# Patient Record
Sex: Female | Born: 1945 | Race: White | Hispanic: No | Marital: Married | Smoking: Never smoker
Health system: Southern US, Community
[De-identification: ages and names within clinical notes are randomized; demographics above are authoritative.]

## PROBLEM LIST (undated history)

## (undated) DIAGNOSIS — D473 Essential (hemorrhagic) thrombocythemia: Secondary | ICD-10-CM

## (undated) DIAGNOSIS — M545 Low back pain, unspecified: Secondary | ICD-10-CM

## (undated) DIAGNOSIS — I5032 Chronic diastolic (congestive) heart failure: Secondary | ICD-10-CM

## (undated) DIAGNOSIS — E871 Hypo-osmolality and hyponatremia: Secondary | ICD-10-CM

## (undated) DIAGNOSIS — R51 Headache: Secondary | ICD-10-CM

## (undated) DIAGNOSIS — I10 Essential (primary) hypertension: Secondary | ICD-10-CM

## (undated) DIAGNOSIS — R112 Nausea with vomiting, unspecified: Secondary | ICD-10-CM

## (undated) DIAGNOSIS — K219 Gastro-esophageal reflux disease without esophagitis: Secondary | ICD-10-CM

## (undated) DIAGNOSIS — G8929 Other chronic pain: Secondary | ICD-10-CM

## (undated) DIAGNOSIS — F419 Anxiety disorder, unspecified: Secondary | ICD-10-CM

## (undated) DIAGNOSIS — E7800 Pure hypercholesterolemia, unspecified: Secondary | ICD-10-CM

## (undated) DIAGNOSIS — Z9889 Other specified postprocedural states: Secondary | ICD-10-CM

## (undated) DIAGNOSIS — G459 Transient cerebral ischemic attack, unspecified: Secondary | ICD-10-CM

## (undated) DIAGNOSIS — E039 Hypothyroidism, unspecified: Secondary | ICD-10-CM

## (undated) HISTORY — PX: BACK SURGERY: SHX140

## (undated) HISTORY — DX: Hypo-osmolality and hyponatremia: E87.1

## (undated) HISTORY — PX: PERIPHERALLY INSERTED CENTRAL CATHETER INSERTION: SHX2221

## (undated) HISTORY — PX: INCISION AND DRAINAGE OF WOUND: SHX1803

## (undated) HISTORY — DX: Essential (hemorrhagic) thrombocythemia: D47.3

## (undated) HISTORY — DX: Chronic diastolic (congestive) heart failure: I50.32

## (undated) HISTORY — PX: CHOLECYSTECTOMY: SHX55

## (undated) MED FILL — Ferumoxytol Inj 510 MG/17ML (30 MG/ML) (Elemental Fe): INTRAVENOUS | Qty: 17 | Status: AC

---

## 1989-01-31 HISTORY — PX: BLADDER SUSPENSION: SHX72

## 1997-11-28 ENCOUNTER — Ambulatory Visit (HOSPITAL_COMMUNITY): Admission: RE | Admit: 1997-11-28 | Discharge: 1997-11-28 | Payer: Self-pay | Admitting: *Deleted

## 1998-04-10 ENCOUNTER — Ambulatory Visit (HOSPITAL_COMMUNITY): Admission: RE | Admit: 1998-04-10 | Discharge: 1998-04-10 | Payer: Self-pay | Admitting: Internal Medicine

## 1998-04-10 ENCOUNTER — Encounter: Admission: RE | Admit: 1998-04-10 | Discharge: 1998-04-10 | Payer: Self-pay | Admitting: Internal Medicine

## 1998-04-11 ENCOUNTER — Ambulatory Visit (HOSPITAL_COMMUNITY): Admission: RE | Admit: 1998-04-11 | Discharge: 1998-04-11 | Payer: Self-pay | Admitting: Hematology and Oncology

## 1998-11-29 ENCOUNTER — Encounter: Payer: Self-pay | Admitting: *Deleted

## 1998-11-29 ENCOUNTER — Ambulatory Visit (HOSPITAL_COMMUNITY): Admission: RE | Admit: 1998-11-29 | Discharge: 1998-11-29 | Payer: Self-pay | Admitting: *Deleted

## 1999-12-06 ENCOUNTER — Encounter: Payer: Self-pay | Admitting: *Deleted

## 1999-12-06 ENCOUNTER — Ambulatory Visit (HOSPITAL_COMMUNITY): Admission: RE | Admit: 1999-12-06 | Discharge: 1999-12-06 | Payer: Self-pay | Admitting: *Deleted

## 2000-12-08 ENCOUNTER — Ambulatory Visit (HOSPITAL_COMMUNITY): Admission: RE | Admit: 2000-12-08 | Discharge: 2000-12-08 | Payer: Self-pay | Admitting: *Deleted

## 2000-12-08 ENCOUNTER — Encounter: Payer: Self-pay | Admitting: *Deleted

## 2009-08-03 ENCOUNTER — Observation Stay (HOSPITAL_COMMUNITY): Admission: EM | Admit: 2009-08-03 | Discharge: 2009-08-04 | Payer: Self-pay | Admitting: Emergency Medicine

## 2009-08-03 ENCOUNTER — Ambulatory Visit: Payer: Self-pay | Admitting: Cardiology

## 2009-08-03 ENCOUNTER — Ambulatory Visit: Payer: Self-pay | Admitting: Gastroenterology

## 2010-08-26 LAB — URINE MICROSCOPIC-ADD ON

## 2010-08-26 LAB — LIPID PANEL
Cholesterol: 221 mg/dL — ABNORMAL HIGH (ref 0–200)
HDL: 53 mg/dL (ref >39)
LDL Cholesterol: 144 mg/dL — ABNORMAL HIGH (ref 0–99)
Total CHOL/HDL Ratio: 4.2 RATIO
Triglycerides: 118 mg/dL (ref <150)
VLDL: 24 mg/dL (ref 0–40)

## 2010-08-26 LAB — COMPREHENSIVE METABOLIC PANEL
ALT: 22 U/L (ref 0–35)
AST: 54 U/L — ABNORMAL HIGH (ref 0–37)
Albumin: 3.9 g/dL (ref 3.5–5.2)
CO2: 28 mEq/L (ref 19–32)
Calcium: 9.5 mg/dL (ref 8.4–10.5)
Chloride: 99 mEq/L (ref 96–112)
GFR calc Af Amer: >60 mL/min (ref >60)
GFR calc non Af Amer: >60 mL/min (ref >60)
Sodium: 132 mEq/L — ABNORMAL LOW (ref 135–145)

## 2010-08-26 LAB — LIPASE, BLOOD: Lipase: 20 U/L (ref 11–59)

## 2010-08-26 LAB — URINALYSIS, ROUTINE W REFLEX MICROSCOPIC
Glucose, UA: NEGATIVE mg/dL
Hgb urine dipstick: NEGATIVE
Ketones, ur: 40 mg/dL — AB
Nitrite: NEGATIVE
Protein, ur: 30 mg/dL — AB
Specific Gravity, Urine: 1.028 (ref 1.005–1.030)
Urobilinogen, UA: 0.2 mg/dL (ref 0.0–1.0)
pH: 6 (ref 5.0–8.0)

## 2010-08-26 LAB — CBC
MCHC: 33.8 g/dL (ref 30.0–36.0)
Platelets: 352 10*3/uL (ref 150–400)
RBC: 4.31 MIL/uL (ref 3.87–5.11)
WBC: 11 10*3/uL — ABNORMAL HIGH (ref 4.0–10.5)

## 2010-08-26 LAB — POCT CARDIAC MARKERS
CKMB, poc: 1.1 ng/mL (ref 1.0–8.0)
CKMB, poc: 2.4 ng/mL (ref 1.0–8.0)
Myoglobin, poc: 119 ng/mL (ref 12–200)
Troponin i, poc: <0.05 ng/mL (ref 0.00–0.09)
Troponin i, poc: <0.05 ng/mL (ref 0.00–0.09)

## 2010-08-26 LAB — DIFFERENTIAL
Eosinophils Absolute: 0 10*3/uL (ref 0.0–0.7)
Eosinophils Relative: 0 % (ref 0–5)
Lymphs Abs: 0.5 10*3/uL — ABNORMAL LOW (ref 0.7–4.0)
Monocytes Absolute: 0.2 10*3/uL (ref 0.1–1.0)

## 2010-08-26 LAB — CARDIAC PANEL(CRET KIN+CKTOT+MB+TROPI)
CK, MB: 2.3 ng/mL (ref 0.3–4.0)
Relative Index: 1.8 (ref 0.0–2.5)
Total CK: 131 U/L (ref 7–177)

## 2010-08-26 LAB — CK TOTAL AND CKMB (NOT AT ARMC): Relative Index: 2 (ref 0.0–2.5)

## 2011-03-28 IMAGING — US US ABDOMEN COMPLETE
1 series · 14 of 25 positions shown · non-contrast
Comparison: None

CLINICAL DATA: Right upper quadrant pain, nausea, vomiting.

COMPLETE ABDOMINAL ULTRASOUND

[Series 1: us abdomen complete · 0.33mm/px · 14 of 73 slices shown]
[im 1/73]
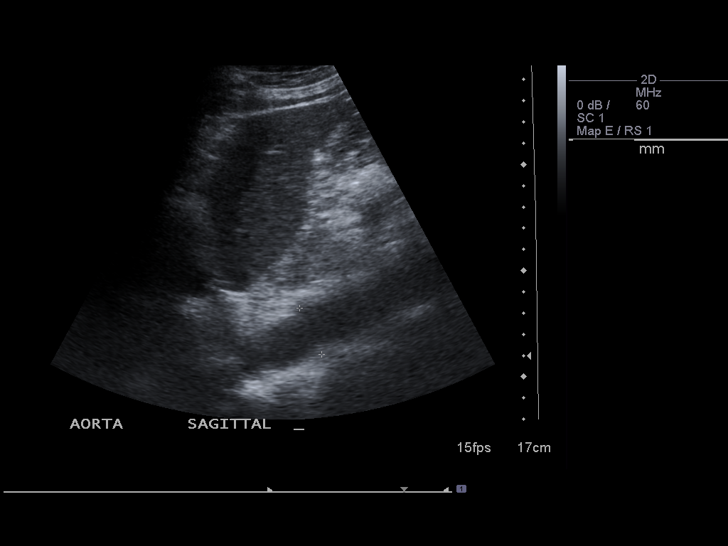
[im 7/73]
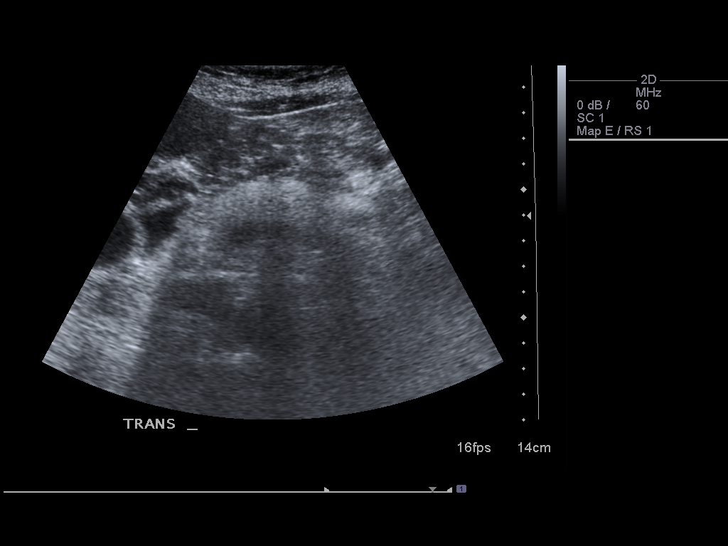
[im 13/73]
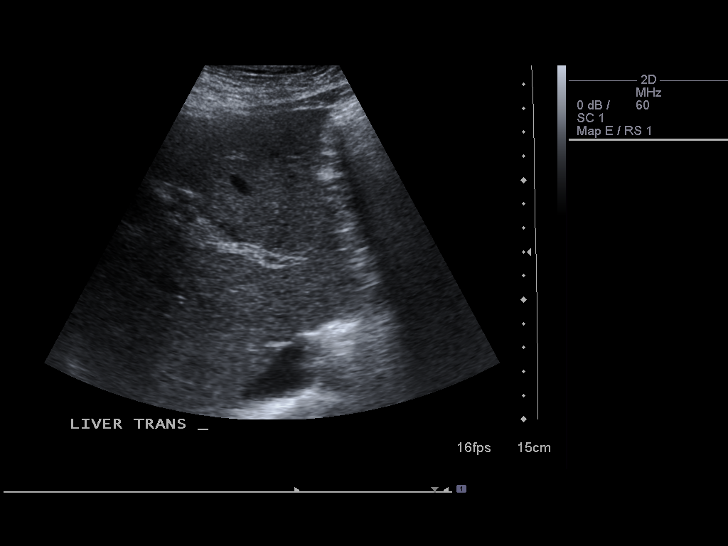
[im 19/73]
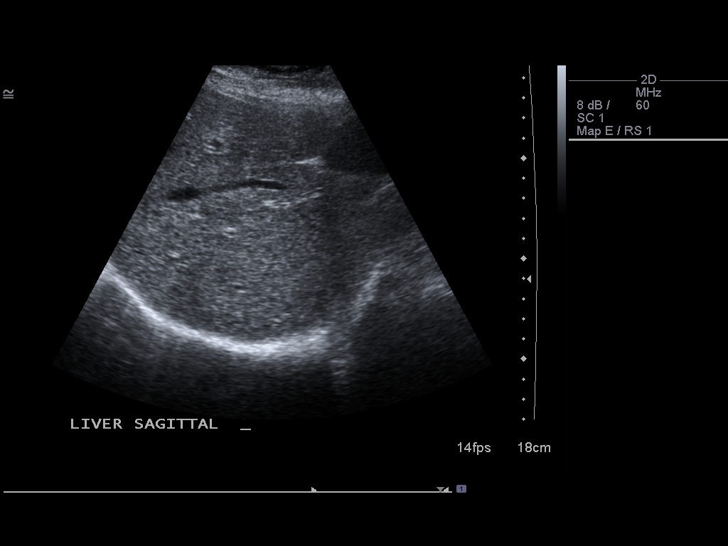
[im 25/73]
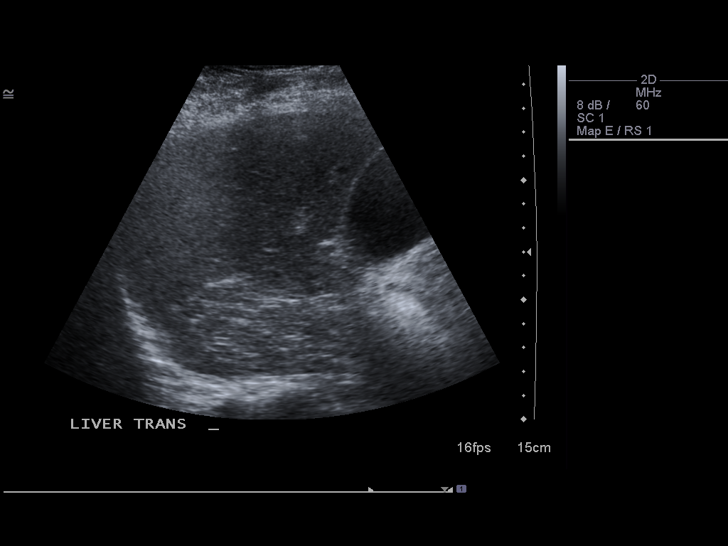
[im 28/73]
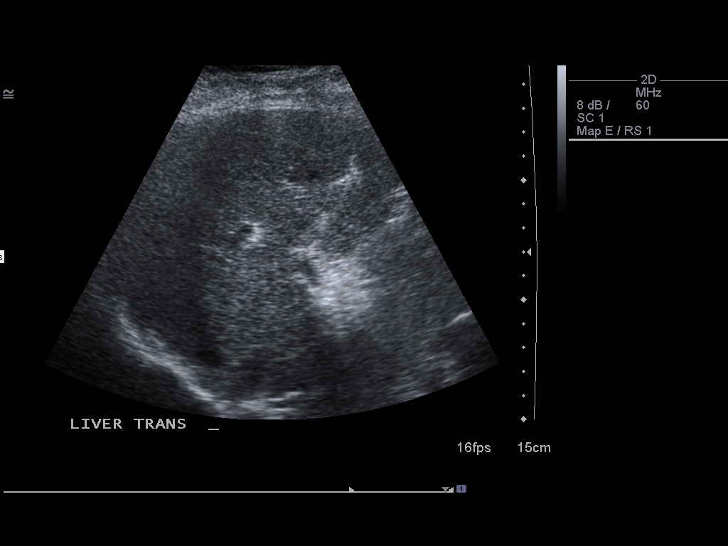
[im 34/73]
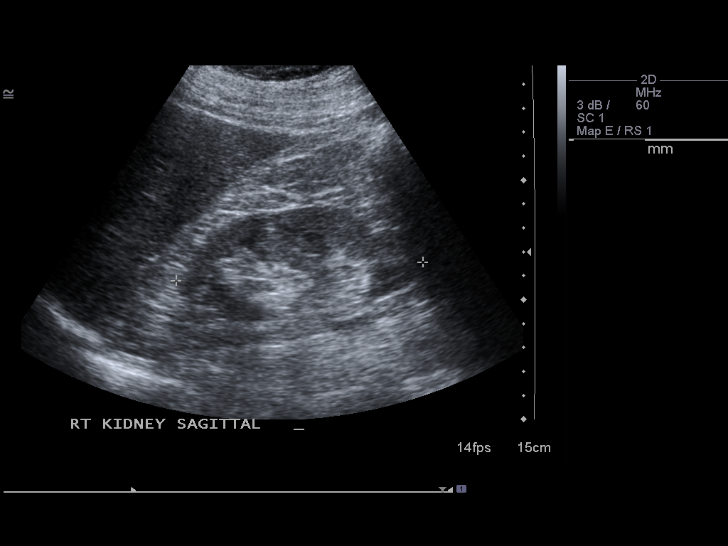
[im 40/73]
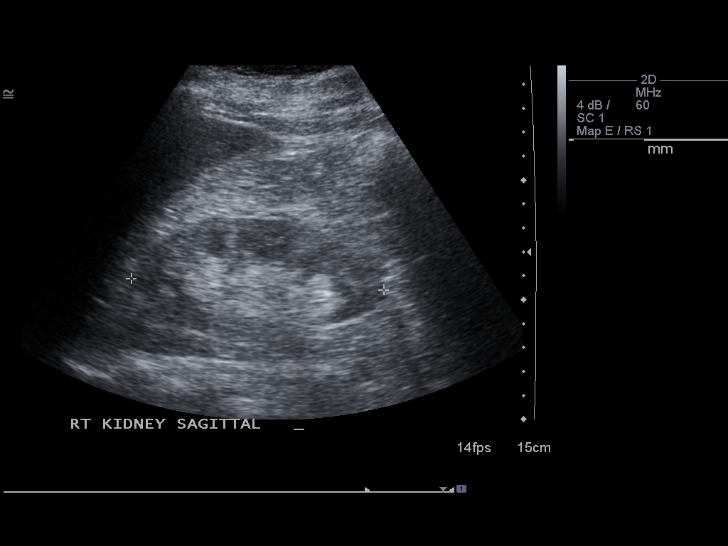
[im 46/73]
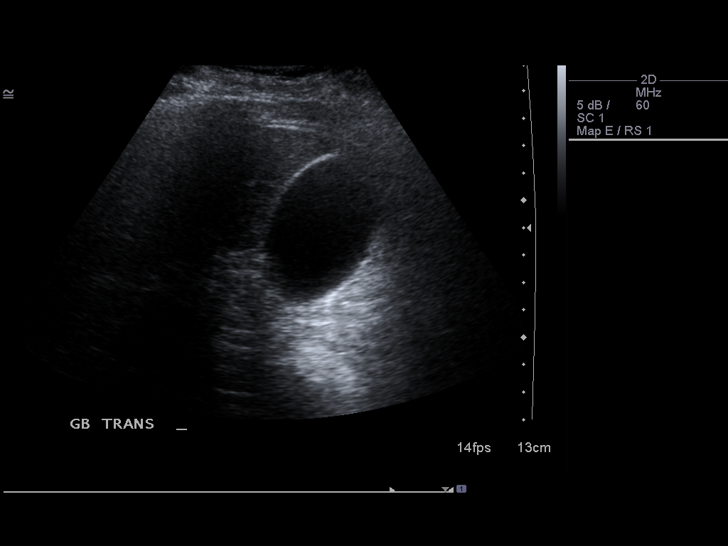
[im 49/73]
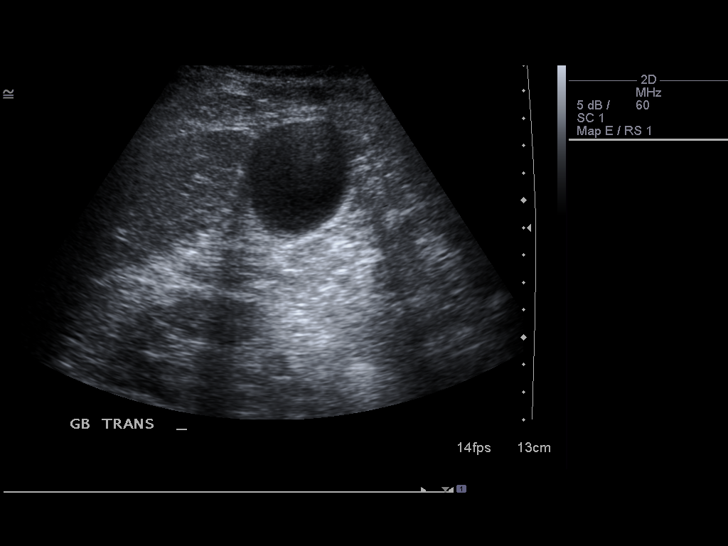
[im 55/73]
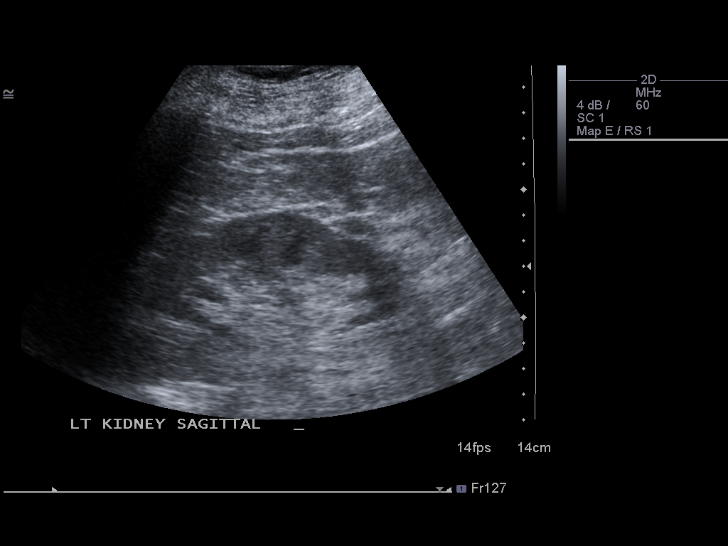
[im 61/73]
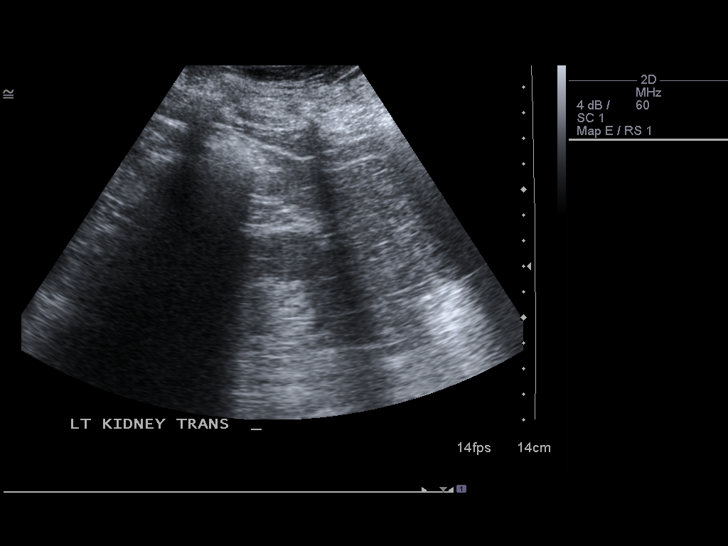
[im 67/73]
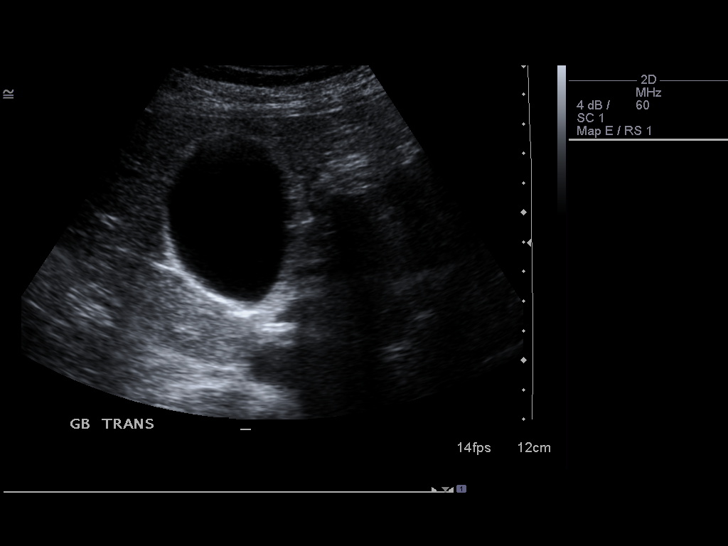
[im 73/73]
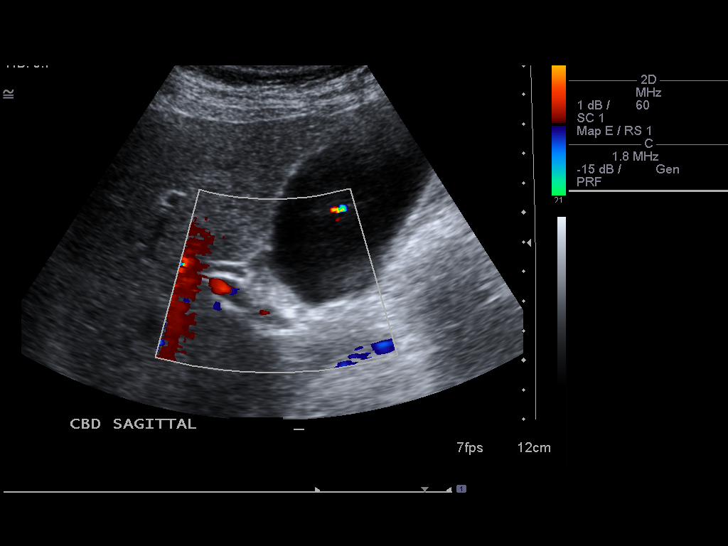

[14 of 25 positions shown; findings below may reference images not displayed]

FINDINGS: Gallbladder:  No gallstones, gallbladder wall thickening, or
pericholecystic fluid.

Common bile duct:   Within normal limits in caliber.

Liver:  1.6 cm benign-appearing cyst in the left hepatic lobe.
Otherwise no focal abnormalities.  Normal echotexture and size.

IVC:  Appears normal.

Pancreas:  No focal abnormality seen.

Spleen:  Within normal limits in size and echotexture.

Right Kidney:   Normal in size and parenchymal echogenicity.  No
evidence of mass or hydronephrosis.

Left Kidney:  Normal in size and parenchymal echogenicity.  No
evidence of mass or hydronephrosis.

Abdominal aorta:  No aneurysm identified.
IMPRESSION: No evidence of cholelithiasis or acute cholecystitis.

No acute findings in the abdomen.

## 2011-11-06 DIAGNOSIS — E039 Hypothyroidism, unspecified: Secondary | ICD-10-CM | POA: Diagnosis not present

## 2011-11-06 DIAGNOSIS — M48061 Spinal stenosis, lumbar region without neurogenic claudication: Secondary | ICD-10-CM | POA: Diagnosis not present

## 2011-11-06 DIAGNOSIS — IMO0002 Reserved for concepts with insufficient information to code with codable children: Secondary | ICD-10-CM | POA: Diagnosis not present

## 2011-11-06 DIAGNOSIS — E7800 Pure hypercholesterolemia, unspecified: Secondary | ICD-10-CM | POA: Diagnosis not present

## 2011-11-06 DIAGNOSIS — I10 Essential (primary) hypertension: Secondary | ICD-10-CM | POA: Diagnosis not present

## 2011-11-06 HISTORY — PX: LUMBAR DISC SURGERY: SHX700

## 2011-11-15 DIAGNOSIS — D62 Acute posthemorrhagic anemia: Secondary | ICD-10-CM | POA: Diagnosis not present

## 2011-11-15 DIAGNOSIS — G988 Other disorders of nervous system: Secondary | ICD-10-CM | POA: Diagnosis not present

## 2011-11-15 DIAGNOSIS — R9431 Abnormal electrocardiogram [ECG] [EKG]: Secondary | ICD-10-CM | POA: Diagnosis not present

## 2011-11-15 DIAGNOSIS — E039 Hypothyroidism, unspecified: Secondary | ICD-10-CM | POA: Diagnosis not present

## 2011-11-15 DIAGNOSIS — Z79899 Other long term (current) drug therapy: Secondary | ICD-10-CM | POA: Diagnosis not present

## 2011-11-15 DIAGNOSIS — A0472 Enterocolitis due to Clostridium difficile, not specified as recurrent: Secondary | ICD-10-CM | POA: Diagnosis not present

## 2011-11-15 DIAGNOSIS — K920 Hematemesis: Secondary | ICD-10-CM | POA: Diagnosis not present

## 2011-11-15 DIAGNOSIS — R5381 Other malaise: Secondary | ICD-10-CM | POA: Diagnosis not present

## 2011-11-15 DIAGNOSIS — K219 Gastro-esophageal reflux disease without esophagitis: Secondary | ICD-10-CM | POA: Diagnosis not present

## 2011-11-15 DIAGNOSIS — E876 Hypokalemia: Secondary | ICD-10-CM | POA: Diagnosis not present

## 2011-11-15 DIAGNOSIS — G4700 Insomnia, unspecified: Secondary | ICD-10-CM | POA: Diagnosis present

## 2011-11-15 DIAGNOSIS — T819XXA Unspecified complication of procedure, initial encounter: Secondary | ICD-10-CM | POA: Diagnosis not present

## 2011-11-15 DIAGNOSIS — J189 Pneumonia, unspecified organism: Secondary | ICD-10-CM | POA: Diagnosis not present

## 2011-11-15 DIAGNOSIS — R5383 Other fatigue: Secondary | ICD-10-CM | POA: Diagnosis not present

## 2011-11-15 DIAGNOSIS — E785 Hyperlipidemia, unspecified: Secondary | ICD-10-CM | POA: Diagnosis not present

## 2011-11-15 DIAGNOSIS — D500 Iron deficiency anemia secondary to blood loss (chronic): Secondary | ICD-10-CM | POA: Diagnosis not present

## 2011-11-15 DIAGNOSIS — D649 Anemia, unspecified: Secondary | ICD-10-CM | POA: Diagnosis not present

## 2011-11-15 DIAGNOSIS — I10 Essential (primary) hypertension: Secondary | ICD-10-CM | POA: Diagnosis not present

## 2011-11-15 DIAGNOSIS — G8918 Other acute postprocedural pain: Secondary | ICD-10-CM | POA: Diagnosis not present

## 2011-11-15 DIAGNOSIS — T8140XA Infection following a procedure, unspecified, initial encounter: Secondary | ICD-10-CM | POA: Diagnosis not present

## 2011-11-15 DIAGNOSIS — D638 Anemia in other chronic diseases classified elsewhere: Secondary | ICD-10-CM | POA: Diagnosis not present

## 2011-11-15 DIAGNOSIS — G9741 Accidental puncture or laceration of dura during a procedure: Secondary | ICD-10-CM | POA: Diagnosis not present

## 2011-11-15 DIAGNOSIS — R197 Diarrhea, unspecified: Secondary | ICD-10-CM | POA: Diagnosis not present

## 2011-11-15 DIAGNOSIS — I130 Hypertensive heart and chronic kidney disease with heart failure and stage 1 through stage 4 chronic kidney disease, or unspecified chronic kidney disease: Secondary | ICD-10-CM | POA: Diagnosis not present

## 2011-11-15 DIAGNOSIS — Y838 Other surgical procedures as the cause of abnormal reaction of the patient, or of later complication, without mention of misadventure at the time of the procedure: Secondary | ICD-10-CM | POA: Diagnosis not present

## 2011-11-15 DIAGNOSIS — I495 Sick sinus syndrome: Secondary | ICD-10-CM | POA: Diagnosis not present

## 2011-11-15 DIAGNOSIS — N039 Chronic nephritic syndrome with unspecified morphologic changes: Secondary | ICD-10-CM | POA: Diagnosis not present

## 2011-11-15 DIAGNOSIS — E871 Hypo-osmolality and hyponatremia: Secondary | ICD-10-CM | POA: Diagnosis not present

## 2011-11-15 DIAGNOSIS — F411 Generalized anxiety disorder: Secondary | ICD-10-CM | POA: Diagnosis present

## 2011-11-15 DIAGNOSIS — R110 Nausea: Secondary | ICD-10-CM | POA: Diagnosis not present

## 2011-11-15 DIAGNOSIS — A419 Sepsis, unspecified organism: Secondary | ICD-10-CM | POA: Diagnosis not present

## 2011-11-15 DIAGNOSIS — M545 Low back pain, unspecified: Secondary | ICD-10-CM | POA: Diagnosis not present

## 2011-11-15 DIAGNOSIS — J9819 Other pulmonary collapse: Secondary | ICD-10-CM | POA: Diagnosis not present

## 2011-11-15 DIAGNOSIS — I119 Hypertensive heart disease without heart failure: Secondary | ICD-10-CM | POA: Diagnosis not present

## 2011-11-15 DIAGNOSIS — D6489 Other specified anemias: Secondary | ICD-10-CM | POA: Diagnosis present

## 2011-12-01 DIAGNOSIS — I119 Hypertensive heart disease without heart failure: Secondary | ICD-10-CM | POA: Diagnosis not present

## 2011-12-01 DIAGNOSIS — K219 Gastro-esophageal reflux disease without esophagitis: Secondary | ICD-10-CM | POA: Diagnosis not present

## 2011-12-01 DIAGNOSIS — R32 Unspecified urinary incontinence: Secondary | ICD-10-CM | POA: Diagnosis not present

## 2011-12-01 DIAGNOSIS — Z48811 Encounter for surgical aftercare following surgery on the nervous system: Secondary | ICD-10-CM | POA: Diagnosis not present

## 2011-12-01 DIAGNOSIS — R269 Unspecified abnormalities of gait and mobility: Secondary | ICD-10-CM | POA: Diagnosis not present

## 2011-12-02 DIAGNOSIS — Z48811 Encounter for surgical aftercare following surgery on the nervous system: Secondary | ICD-10-CM | POA: Diagnosis not present

## 2011-12-02 DIAGNOSIS — K219 Gastro-esophageal reflux disease without esophagitis: Secondary | ICD-10-CM | POA: Diagnosis not present

## 2011-12-02 DIAGNOSIS — R32 Unspecified urinary incontinence: Secondary | ICD-10-CM | POA: Diagnosis not present

## 2011-12-02 DIAGNOSIS — I119 Hypertensive heart disease without heart failure: Secondary | ICD-10-CM | POA: Diagnosis not present

## 2011-12-02 DIAGNOSIS — R269 Unspecified abnormalities of gait and mobility: Secondary | ICD-10-CM | POA: Diagnosis not present

## 2011-12-05 DIAGNOSIS — R269 Unspecified abnormalities of gait and mobility: Secondary | ICD-10-CM | POA: Diagnosis not present

## 2011-12-05 DIAGNOSIS — K219 Gastro-esophageal reflux disease without esophagitis: Secondary | ICD-10-CM | POA: Diagnosis not present

## 2011-12-05 DIAGNOSIS — I119 Hypertensive heart disease without heart failure: Secondary | ICD-10-CM | POA: Diagnosis not present

## 2011-12-05 DIAGNOSIS — R32 Unspecified urinary incontinence: Secondary | ICD-10-CM | POA: Diagnosis not present

## 2011-12-05 DIAGNOSIS — Z48811 Encounter for surgical aftercare following surgery on the nervous system: Secondary | ICD-10-CM | POA: Diagnosis not present

## 2011-12-08 DIAGNOSIS — L02818 Cutaneous abscess of other sites: Secondary | ICD-10-CM | POA: Diagnosis not present

## 2011-12-08 DIAGNOSIS — R269 Unspecified abnormalities of gait and mobility: Secondary | ICD-10-CM | POA: Diagnosis not present

## 2011-12-08 DIAGNOSIS — R5383 Other fatigue: Secondary | ICD-10-CM | POA: Diagnosis not present

## 2011-12-08 DIAGNOSIS — I119 Hypertensive heart disease without heart failure: Secondary | ICD-10-CM | POA: Diagnosis not present

## 2011-12-08 DIAGNOSIS — R5381 Other malaise: Secondary | ICD-10-CM | POA: Diagnosis not present

## 2011-12-08 DIAGNOSIS — E871 Hypo-osmolality and hyponatremia: Secondary | ICD-10-CM | POA: Diagnosis not present

## 2011-12-08 DIAGNOSIS — Z48811 Encounter for surgical aftercare following surgery on the nervous system: Secondary | ICD-10-CM | POA: Diagnosis not present

## 2011-12-08 DIAGNOSIS — K219 Gastro-esophageal reflux disease without esophagitis: Secondary | ICD-10-CM | POA: Diagnosis not present

## 2011-12-08 DIAGNOSIS — A0472 Enterocolitis due to Clostridium difficile, not specified as recurrent: Secondary | ICD-10-CM | POA: Diagnosis not present

## 2011-12-08 DIAGNOSIS — R32 Unspecified urinary incontinence: Secondary | ICD-10-CM | POA: Diagnosis not present

## 2011-12-10 DIAGNOSIS — I119 Hypertensive heart disease without heart failure: Secondary | ICD-10-CM | POA: Diagnosis not present

## 2011-12-10 DIAGNOSIS — R32 Unspecified urinary incontinence: Secondary | ICD-10-CM | POA: Diagnosis not present

## 2011-12-10 DIAGNOSIS — Z48811 Encounter for surgical aftercare following surgery on the nervous system: Secondary | ICD-10-CM | POA: Diagnosis not present

## 2011-12-10 DIAGNOSIS — R269 Unspecified abnormalities of gait and mobility: Secondary | ICD-10-CM | POA: Diagnosis not present

## 2011-12-10 DIAGNOSIS — K219 Gastro-esophageal reflux disease without esophagitis: Secondary | ICD-10-CM | POA: Diagnosis not present

## 2011-12-12 DIAGNOSIS — I10 Essential (primary) hypertension: Secondary | ICD-10-CM | POA: Diagnosis not present

## 2011-12-12 DIAGNOSIS — L03319 Cellulitis of trunk, unspecified: Secondary | ICD-10-CM | POA: Diagnosis present

## 2011-12-12 DIAGNOSIS — M6281 Muscle weakness (generalized): Secondary | ICD-10-CM | POA: Diagnosis not present

## 2011-12-12 DIAGNOSIS — Z5189 Encounter for other specified aftercare: Secondary | ICD-10-CM | POA: Diagnosis not present

## 2011-12-12 DIAGNOSIS — E039 Hypothyroidism, unspecified: Secondary | ICD-10-CM | POA: Diagnosis not present

## 2011-12-12 DIAGNOSIS — R262 Difficulty in walking, not elsewhere classified: Secondary | ICD-10-CM | POA: Diagnosis not present

## 2011-12-12 DIAGNOSIS — I119 Hypertensive heart disease without heart failure: Secondary | ICD-10-CM | POA: Diagnosis not present

## 2011-12-12 DIAGNOSIS — L02219 Cutaneous abscess of trunk, unspecified: Secondary | ICD-10-CM | POA: Diagnosis present

## 2011-12-12 DIAGNOSIS — R279 Unspecified lack of coordination: Secondary | ICD-10-CM | POA: Diagnosis not present

## 2011-12-12 DIAGNOSIS — Z79899 Other long term (current) drug therapy: Secondary | ICD-10-CM | POA: Diagnosis not present

## 2011-12-12 DIAGNOSIS — Z4789 Encounter for other orthopedic aftercare: Secondary | ICD-10-CM | POA: Diagnosis not present

## 2011-12-12 DIAGNOSIS — Z9889 Other specified postprocedural states: Secondary | ICD-10-CM | POA: Diagnosis not present

## 2011-12-12 DIAGNOSIS — A4901 Methicillin susceptible Staphylococcus aureus infection, unspecified site: Secondary | ICD-10-CM | POA: Diagnosis not present

## 2011-12-12 DIAGNOSIS — A0472 Enterocolitis due to Clostridium difficile, not specified as recurrent: Secondary | ICD-10-CM | POA: Diagnosis not present

## 2011-12-12 DIAGNOSIS — S21209A Unspecified open wound of unspecified back wall of thorax without penetration into thoracic cavity, initial encounter: Secondary | ICD-10-CM | POA: Diagnosis not present

## 2011-12-12 DIAGNOSIS — K219 Gastro-esophageal reflux disease without esophagitis: Secondary | ICD-10-CM | POA: Diagnosis not present

## 2011-12-12 DIAGNOSIS — E785 Hyperlipidemia, unspecified: Secondary | ICD-10-CM | POA: Diagnosis not present

## 2011-12-12 DIAGNOSIS — T8140XA Infection following a procedure, unspecified, initial encounter: Secondary | ICD-10-CM | POA: Diagnosis not present

## 2011-12-12 DIAGNOSIS — M549 Dorsalgia, unspecified: Secondary | ICD-10-CM | POA: Diagnosis not present

## 2011-12-15 DIAGNOSIS — M549 Dorsalgia, unspecified: Secondary | ICD-10-CM | POA: Diagnosis not present

## 2011-12-15 DIAGNOSIS — E039 Hypothyroidism, unspecified: Secondary | ICD-10-CM | POA: Diagnosis not present

## 2011-12-15 DIAGNOSIS — M6281 Muscle weakness (generalized): Secondary | ICD-10-CM | POA: Diagnosis not present

## 2011-12-15 DIAGNOSIS — Z9889 Other specified postprocedural states: Secondary | ICD-10-CM | POA: Diagnosis not present

## 2011-12-15 DIAGNOSIS — Z4789 Encounter for other orthopedic aftercare: Secondary | ICD-10-CM | POA: Diagnosis not present

## 2011-12-15 DIAGNOSIS — Z7901 Long term (current) use of anticoagulants: Secondary | ICD-10-CM | POA: Diagnosis not present

## 2011-12-15 DIAGNOSIS — T8140XA Infection following a procedure, unspecified, initial encounter: Secondary | ICD-10-CM | POA: Diagnosis not present

## 2011-12-15 DIAGNOSIS — R262 Difficulty in walking, not elsewhere classified: Secondary | ICD-10-CM | POA: Diagnosis not present

## 2011-12-15 DIAGNOSIS — A0472 Enterocolitis due to Clostridium difficile, not specified as recurrent: Secondary | ICD-10-CM | POA: Diagnosis not present

## 2011-12-15 DIAGNOSIS — E785 Hyperlipidemia, unspecified: Secondary | ICD-10-CM | POA: Diagnosis not present

## 2011-12-15 DIAGNOSIS — S21209A Unspecified open wound of unspecified back wall of thorax without penetration into thoracic cavity, initial encounter: Secondary | ICD-10-CM | POA: Diagnosis not present

## 2011-12-15 DIAGNOSIS — Z5189 Encounter for other specified aftercare: Secondary | ICD-10-CM | POA: Diagnosis not present

## 2011-12-15 DIAGNOSIS — A4901 Methicillin susceptible Staphylococcus aureus infection, unspecified site: Secondary | ICD-10-CM | POA: Diagnosis not present

## 2011-12-15 DIAGNOSIS — L02219 Cutaneous abscess of trunk, unspecified: Secondary | ICD-10-CM | POA: Diagnosis not present

## 2011-12-15 DIAGNOSIS — R279 Unspecified lack of coordination: Secondary | ICD-10-CM | POA: Diagnosis not present

## 2011-12-15 DIAGNOSIS — I10 Essential (primary) hypertension: Secondary | ICD-10-CM | POA: Diagnosis not present

## 2011-12-15 DIAGNOSIS — Z79899 Other long term (current) drug therapy: Secondary | ICD-10-CM | POA: Diagnosis not present

## 2011-12-15 DIAGNOSIS — K219 Gastro-esophageal reflux disease without esophagitis: Secondary | ICD-10-CM | POA: Diagnosis not present

## 2011-12-15 DIAGNOSIS — L03319 Cellulitis of trunk, unspecified: Secondary | ICD-10-CM | POA: Diagnosis not present

## 2011-12-15 DIAGNOSIS — I119 Hypertensive heart disease without heart failure: Secondary | ICD-10-CM | POA: Diagnosis not present

## 2011-12-15 DIAGNOSIS — IMO0002 Reserved for concepts with insufficient information to code with codable children: Secondary | ICD-10-CM | POA: Diagnosis not present

## 2011-12-29 DIAGNOSIS — M549 Dorsalgia, unspecified: Secondary | ICD-10-CM | POA: Diagnosis not present

## 2011-12-29 DIAGNOSIS — E039 Hypothyroidism, unspecified: Secondary | ICD-10-CM | POA: Diagnosis not present

## 2011-12-29 DIAGNOSIS — T8140XA Infection following a procedure, unspecified, initial encounter: Secondary | ICD-10-CM | POA: Diagnosis not present

## 2011-12-29 DIAGNOSIS — K219 Gastro-esophageal reflux disease without esophagitis: Secondary | ICD-10-CM | POA: Diagnosis not present

## 2012-01-15 DIAGNOSIS — E039 Hypothyroidism, unspecified: Secondary | ICD-10-CM | POA: Diagnosis not present

## 2012-01-15 DIAGNOSIS — G459 Transient cerebral ischemic attack, unspecified: Secondary | ICD-10-CM | POA: Diagnosis not present

## 2012-01-15 DIAGNOSIS — I10 Essential (primary) hypertension: Secondary | ICD-10-CM | POA: Diagnosis not present

## 2012-01-15 DIAGNOSIS — R4701 Aphasia: Secondary | ICD-10-CM | POA: Diagnosis not present

## 2012-01-15 DIAGNOSIS — M5137 Other intervertebral disc degeneration, lumbosacral region: Secondary | ICD-10-CM | POA: Diagnosis not present

## 2012-01-15 DIAGNOSIS — R269 Unspecified abnormalities of gait and mobility: Secondary | ICD-10-CM | POA: Diagnosis not present

## 2012-01-15 DIAGNOSIS — Z79899 Other long term (current) drug therapy: Secondary | ICD-10-CM | POA: Diagnosis not present

## 2012-01-15 DIAGNOSIS — Z9889 Other specified postprocedural states: Secondary | ICD-10-CM | POA: Diagnosis not present

## 2012-01-15 DIAGNOSIS — I619 Nontraumatic intracerebral hemorrhage, unspecified: Secondary | ICD-10-CM | POA: Diagnosis not present

## 2012-01-15 DIAGNOSIS — E876 Hypokalemia: Secondary | ICD-10-CM | POA: Diagnosis not present

## 2012-01-15 DIAGNOSIS — F411 Generalized anxiety disorder: Secondary | ICD-10-CM | POA: Diagnosis not present

## 2012-01-15 DIAGNOSIS — R4182 Altered mental status, unspecified: Secondary | ICD-10-CM | POA: Diagnosis not present

## 2012-01-15 DIAGNOSIS — N390 Urinary tract infection, site not specified: Secondary | ICD-10-CM | POA: Diagnosis not present

## 2012-01-15 DIAGNOSIS — F329 Major depressive disorder, single episode, unspecified: Secondary | ICD-10-CM | POA: Diagnosis not present

## 2012-01-15 DIAGNOSIS — E785 Hyperlipidemia, unspecified: Secondary | ICD-10-CM | POA: Diagnosis not present

## 2012-01-15 DIAGNOSIS — K219 Gastro-esophageal reflux disease without esophagitis: Secondary | ICD-10-CM | POA: Diagnosis not present

## 2012-01-15 DIAGNOSIS — A499 Bacterial infection, unspecified: Secondary | ICD-10-CM | POA: Diagnosis not present

## 2012-01-15 DIAGNOSIS — I119 Hypertensive heart disease without heart failure: Secondary | ICD-10-CM | POA: Diagnosis not present

## 2012-01-16 ENCOUNTER — Encounter (HOSPITAL_COMMUNITY): Payer: Self-pay | Admitting: General Practice

## 2012-01-16 ENCOUNTER — Inpatient Hospital Stay (HOSPITAL_COMMUNITY): Payer: Medicare Other

## 2012-01-16 ENCOUNTER — Inpatient Hospital Stay (HOSPITAL_COMMUNITY)
Admission: AD | Admit: 2012-01-16 | Discharge: 2012-01-18 | DRG: 093 | Disposition: A | Discharge status: Home / Self Care | Payer: Medicare Other | Source: Other Acute Inpatient Hospital | Attending: Internal Medicine | Admitting: Internal Medicine

## 2012-01-16 DIAGNOSIS — F411 Generalized anxiety disorder: Secondary | ICD-10-CM | POA: Diagnosis present

## 2012-01-16 DIAGNOSIS — G459 Transient cerebral ischemic attack, unspecified: Secondary | ICD-10-CM

## 2012-01-16 DIAGNOSIS — Z9889 Other specified postprocedural states: Secondary | ICD-10-CM | POA: Diagnosis not present

## 2012-01-16 DIAGNOSIS — M47817 Spondylosis without myelopathy or radiculopathy, lumbosacral region: Secondary | ICD-10-CM | POA: Diagnosis present

## 2012-01-16 DIAGNOSIS — Z79899 Other long term (current) drug therapy: Secondary | ICD-10-CM | POA: Diagnosis not present

## 2012-01-16 DIAGNOSIS — I639 Cerebral infarction, unspecified: Secondary | ICD-10-CM

## 2012-01-16 DIAGNOSIS — I619 Nontraumatic intracerebral hemorrhage, unspecified: Secondary | ICD-10-CM | POA: Diagnosis not present

## 2012-01-16 DIAGNOSIS — M1990 Unspecified osteoarthritis, unspecified site: Secondary | ICD-10-CM | POA: Diagnosis present

## 2012-01-16 DIAGNOSIS — I509 Heart failure, unspecified: Secondary | ICD-10-CM | POA: Diagnosis not present

## 2012-01-16 DIAGNOSIS — G9340 Encephalopathy, unspecified: Secondary | ICD-10-CM | POA: Diagnosis not present

## 2012-01-16 DIAGNOSIS — E039 Hypothyroidism, unspecified: Secondary | ICD-10-CM | POA: Diagnosis present

## 2012-01-16 DIAGNOSIS — G929 Unspecified toxic encephalopathy: Principal | ICD-10-CM | POA: Diagnosis present

## 2012-01-16 DIAGNOSIS — I119 Hypertensive heart disease without heart failure: Secondary | ICD-10-CM | POA: Diagnosis not present

## 2012-01-16 DIAGNOSIS — E785 Hyperlipidemia, unspecified: Secondary | ICD-10-CM | POA: Diagnosis present

## 2012-01-16 DIAGNOSIS — I6350 Cerebral infarction due to unspecified occlusion or stenosis of unspecified cerebral artery: Secondary | ICD-10-CM

## 2012-01-16 DIAGNOSIS — K219 Gastro-esophageal reflux disease without esophagitis: Secondary | ICD-10-CM | POA: Diagnosis present

## 2012-01-16 DIAGNOSIS — R51 Headache: Secondary | ICD-10-CM | POA: Diagnosis not present

## 2012-01-16 DIAGNOSIS — M5137 Other intervertebral disc degeneration, lumbosacral region: Secondary | ICD-10-CM | POA: Diagnosis not present

## 2012-01-16 DIAGNOSIS — N039 Chronic nephritic syndrome with unspecified morphologic changes: Secondary | ICD-10-CM | POA: Diagnosis not present

## 2012-01-16 DIAGNOSIS — R269 Unspecified abnormalities of gait and mobility: Secondary | ICD-10-CM | POA: Diagnosis not present

## 2012-01-16 DIAGNOSIS — R4789 Other speech disturbances: Secondary | ICD-10-CM | POA: Diagnosis not present

## 2012-01-16 DIAGNOSIS — G92 Toxic encephalopathy: Principal | ICD-10-CM | POA: Diagnosis present

## 2012-01-16 DIAGNOSIS — I10 Essential (primary) hypertension: Secondary | ICD-10-CM | POA: Diagnosis present

## 2012-01-16 DIAGNOSIS — I6789 Other cerebrovascular disease: Secondary | ICD-10-CM | POA: Diagnosis not present

## 2012-01-16 DIAGNOSIS — D473 Essential (hemorrhagic) thrombocythemia: Secondary | ICD-10-CM | POA: Insufficient documentation

## 2012-01-16 DIAGNOSIS — T40605A Adverse effect of unspecified narcotics, initial encounter: Secondary | ICD-10-CM | POA: Diagnosis present

## 2012-01-16 HISTORY — DX: Pure hypercholesterolemia, unspecified: E78.00

## 2012-01-16 HISTORY — DX: Essential (primary) hypertension: I10

## 2012-01-16 HISTORY — DX: Gastro-esophageal reflux disease without esophagitis: K21.9

## 2012-01-16 HISTORY — DX: Low back pain, unspecified: M54.50

## 2012-01-16 HISTORY — DX: Anxiety disorder, unspecified: F41.9

## 2012-01-16 HISTORY — DX: Other specified postprocedural states: Z98.890

## 2012-01-16 HISTORY — DX: Low back pain: M54.5

## 2012-01-16 HISTORY — DX: Headache: R51

## 2012-01-16 HISTORY — DX: Transient cerebral ischemic attack, unspecified: G45.9

## 2012-01-16 HISTORY — DX: Other chronic pain: G89.29

## 2012-01-16 HISTORY — DX: Hypothyroidism, unspecified: E03.9

## 2012-01-16 HISTORY — DX: Other specified postprocedural states: R11.2

## 2012-01-16 LAB — CBC
Hemoglobin: 10.6 g/dL — ABNORMAL LOW (ref 12.0–15.0)
MCH: 26.7 pg (ref 26.0–34.0)
MCHC: 31.4 g/dL (ref 30.0–36.0)
Platelets: 354 10*3/uL (ref 150–400)

## 2012-01-16 LAB — CREATININE, SERUM: Creatinine, Ser: 0.85 mg/dL (ref 0.50–1.10)

## 2012-01-16 MED ORDER — ACETAMINOPHEN 325 MG PO TABS: 650.0000 mg | ORAL_TABLET | ORAL | Status: DC | PRN

## 2012-01-16 MED ORDER — RISAQUAD PO CAPS
1.0000 | ORAL_CAPSULE | Freq: Every day | ORAL | Status: DC
  Administered 2012-01-16 – 2012-01-18 (×3): 1 via ORAL
  Filled 2012-01-16 (×3): qty 1

## 2012-01-16 MED ORDER — SCOPOLAMINE 1 MG/3DAYS TD PT72
1.0000 | MEDICATED_PATCH | TRANSDERMAL | Status: DC
  Filled 2012-01-16: qty 1

## 2012-01-16 MED ORDER — ONDANSETRON HCL 4 MG/2ML IJ SOLN
INTRAMUSCULAR | Status: AC
  Administered 2012-01-16: 4 mg
  Filled 2012-01-16: qty 2

## 2012-01-16 MED ORDER — PANTOPRAZOLE SODIUM 40 MG PO TBEC
40.0000 mg | DELAYED_RELEASE_TABLET | Freq: Every day | ORAL | Status: DC
  Administered 2012-01-17 – 2012-01-18 (×2): 40 mg via ORAL
  Filled 2012-01-16 (×2): qty 1

## 2012-01-16 MED ORDER — ADULT MULTIVITAMIN W/MINERALS CH
1.0000 | ORAL_TABLET | Freq: Every day | ORAL | Status: DC
  Administered 2012-01-16 – 2012-01-18 (×3): 1 via ORAL
  Filled 2012-01-16 (×3): qty 1

## 2012-01-16 MED ORDER — DEXTROSE 5 % IV SOLN
1.0000 g | INTRAVENOUS | Status: DC
  Administered 2012-01-17: 1 g via INTRAVENOUS
  Filled 2012-01-16 (×2): qty 10

## 2012-01-16 MED ORDER — ACETAMINOPHEN 10 MG/ML IV SOLN
1000.0000 mg | Freq: Four times a day (QID) | INTRAVENOUS | Status: DC
  Administered 2012-01-17 (×3): 1000 mg via INTRAVENOUS
  Filled 2012-01-16 (×4): qty 100

## 2012-01-16 MED ORDER — ASPIRIN 325 MG PO TABS
325.0000 mg | ORAL_TABLET | Freq: Every day | ORAL | Status: DC
  Administered 2012-01-16 – 2012-01-18 (×3): 325 mg via ORAL
  Filled 2012-01-16 (×3): qty 1

## 2012-01-16 MED ORDER — IRBESARTAN 75 MG PO TABS
75.0000 mg | ORAL_TABLET | Freq: Every day | ORAL | Status: DC
  Administered 2012-01-16 – 2012-01-18 (×3): 75 mg via ORAL
  Filled 2012-01-16 (×3): qty 1

## 2012-01-16 MED ORDER — DEXTROSE 5 % IV SOLN
1.0000 g | Freq: Every day | INTRAVENOUS | Status: DC
  Administered 2012-01-16: 1 g via INTRAVENOUS
  Filled 2012-01-16: qty 10

## 2012-01-16 MED ORDER — OXYCODONE HCL 5 MG PO TABS: 5.0000 mg | ORAL_TABLET | Freq: Four times a day (QID) | ORAL | Status: DC | PRN

## 2012-01-16 MED ORDER — HYDRALAZINE HCL 20 MG/ML IJ SOLN
10.0000 mg | Freq: Four times a day (QID) | INTRAMUSCULAR | Status: DC | PRN
  Filled 2012-01-16: qty 0.5

## 2012-01-16 MED ORDER — ONDANSETRON HCL 4 MG/2ML IJ SOLN
4.0000 mg | Freq: Four times a day (QID) | INTRAMUSCULAR | Status: DC | PRN
  Administered 2012-01-18: 4 mg via INTRAVENOUS
  Filled 2012-01-16: qty 2

## 2012-01-16 MED ORDER — ACETAMINOPHEN 650 MG RE SUPP: 650.0000 mg | RECTAL | Status: DC | PRN

## 2012-01-16 MED ORDER — LEVOTHYROXINE SODIUM 88 MCG PO TABS
88.0000 ug | ORAL_TABLET | Freq: Every day | ORAL | Status: DC
  Administered 2012-01-17 – 2012-01-18 (×2): 88 ug via ORAL
  Filled 2012-01-16 (×3): qty 1

## 2012-01-16 MED ORDER — HYDROMORPHONE HCL PF 1 MG/ML IJ SOLN
0.5000 mg | INTRAMUSCULAR | Status: DC | PRN
  Filled 2012-01-16: qty 1

## 2012-01-16 MED ORDER — SENNOSIDES-DOCUSATE SODIUM 8.6-50 MG PO TABS: 1.0000 | ORAL_TABLET | Freq: Every evening | ORAL | Status: DC | PRN

## 2012-01-16 MED ORDER — HEPARIN SODIUM (PORCINE) 5000 UNIT/ML IJ SOLN
5000.0000 [IU] | Freq: Three times a day (TID) | INTRAMUSCULAR | Status: DC
  Administered 2012-01-16 – 2012-01-18 (×5): 5000 [IU] via SUBCUTANEOUS
  Filled 2012-01-16 (×8): qty 1

## 2012-01-16 MED ORDER — SIMVASTATIN 20 MG PO TABS
20.0000 mg | ORAL_TABLET | Freq: Every day | ORAL | Status: DC
  Administered 2012-01-16 – 2012-01-17 (×2): 20 mg via ORAL
  Filled 2012-01-16 (×4): qty 1

## 2012-01-16 MED ORDER — DEXTROSE 5 % IV SOLN
1.0000 g | INTRAVENOUS | Status: DC
  Filled 2012-01-16: qty 10

## 2012-01-16 MED ORDER — TRAMADOL HCL 50 MG PO TABS: 25.0000 mg | ORAL_TABLET | Freq: Four times a day (QID) | ORAL | Status: DC | PRN

## 2012-01-16 MED ORDER — SODIUM CHLORIDE 0.9 % IV SOLN
INTRAVENOUS | Status: DC
  Administered 2012-01-17: 1000 mL via INTRAVENOUS

## 2012-01-16 MED ORDER — DEXTROSE 5 % IV SOLN
1.0000 g | Freq: Once | INTRAVENOUS | Status: AC
  Administered 2012-01-16: 1 g via INTRAVENOUS

## 2012-01-16 NOTE — H&P (Signed)
PCP:   No primary provider on file.   Chief Complaint:  Patient transferred from [facility] today.   HPI: This is a 66 year old female, with known history of HTN, dyslipidemia, hypothyroidism, GERD, OA, anxiety,s/p gall bladder surgery, bladder and rectum suspension. Patient also has lumbo-sacral DJD and chronic back pain, and as a matter of fact, is s/p lumbar laminectomy, by Dr Loralie Champagne at [facility], in early June 2013, complicated by wound infection, requiring I&D on 12/12/2011. She was eventually discharged to [facility] for rehab, at the end of July, 2013, and has completed 5 weeks of the total planned 6-week course of iv Rocephin. On 01/15/12,  At about 7:00 PM, while daughter was helping her get ready for bed, she had a sudden onset of expressive dysphasia, characterized by word-finding difficulties, and difficulty naming objects, but no visual obscuration, limb weakness or gait abnormality. She was evaluated in the ED at [facility], by Dr Dr Allena Earing, at reportedly, was asymptomatic at the time of this initial evaluation. She was admitted with a presumptive diagnosis of TIA, and underwent appropriate work up. Head CT of 01/15/12, showed no acute findings. Brain MRI of 01/16/12, also showed no acute infarct, although there was a small area of blood breakdown products in the left cerebellum. Vascular dopplers showed no hemodynamically significant ICA stenosis bilaterally, and vertebral artery flow was antegrade.12-lead EKG showed SR. These data were contained in patient records, delivered from [facility], and personally reviewed by me.   Allergies:  No Known Allergies    Past Medical History  Diagnosis Date  . Hypertension   . PONV (postoperative nausea and vomiting)     "violently; only thing that works for her is scopolamine patch "  . Hypothyroidism   . Anxiety   . GERD (gastroesophageal reflux disease)   . Chronic lower back pain   . High cholesterol     . Headache 01/16/2012    "all day; think its related to TIA"  . TIA (transient ischemic attack) 01/16/2012    Past Surgical History  Procedure Date  . Back surgery   . Lumbar disc surgery 11/06/2011    "shaved down 2 bulging discs"  . Incision and drainage of wound 11/15/2011; early 12/2011    "abscess; S/P back OR"; "staph infection in wound"  . Peripherally inserted central catheter insertion ~ 12/07/2011    right upper arm  . Bladder suspension 1990's    "w/rectal suspensiion"  . Cholecystectomy ~ 2010    Prior to Admission medications   Not on File    Social History:  does not have a smoking history on file. She has never used smokeless tobacco. She reports that she does not drink alcohol or use illicit drugs. Patient is married, and had offspring.  Family History: Patient's father had a CVA.   Review of Systems:  As per HPI and chief complaint. Patent denies fatigue, diminished appetite, weight loss, fever, chills, headache, blurred vision, has word-finding difficulties and a left supra-orbital headache, but no dysphagia, chest pain, cough, shortness of breath, orthopnea, paroxysmal nocturnal dyspnea, nausea, diaphoresis, abdominal pain, vomiting, diarrhea, belching, heartburn, hematemesis, melena, dysuria, nocturia, urinary frequency, hematochezia, lower extremity swelling, pain, or redness. The rest of the systems review is negative.  Physical Exam:  General:  Patient does not appear to be in obvious acute distress. Somnolent, but easily roused, and became alert immediately, communicative, talking in complete sentences, not short of breath at rest.  HEENT:  Mild clinical  pallor, no jaundice, no conjunctival injection or discharge. Hydration status is fair.  NECK:  Supple, JVP not seen, no carotid bruits, no palpable lymphadenopathy, no palpable goiter. CHEST:  Clinically clear to auscultation, no wheezes, no crackles. HEART:  Sounds 1 and 2 heard, normal, regular, no  murmurs. ABDOMEN:  Full, soft, non-tender, no palpable organomegaly, no palpable masses, normal bowel sounds. Surgical wound on lower back is well healed.  GENITALIA:  Not examined. LOWER EXTREMITIES:  No pitting edema, palpable peripheral pulses. MUSCULOSKELETAL SYSTEM:  Generalized osteoarthritic changes, otherwise, normal. CENTRAL NERVOUS SYSTEM:  Patient has word-finding difficulties, and difficulty naming objects. Occasionally, her sentences appear nonsensical. There is right facial asymmetry, and planter response is up-going on right. Otherwise, no focal neurologic deficit on gross examination. Patient does not really appear to have receptive dysphasia, although she sometimes carried out instructions inaccurately.   Labs on Admission:  No results found for this or any previous visit (from the past 48 hour(s)).  Radiological Exams on Admission: No results found.  Assessment/Plan Active Problems:  1. CVA (cerebral infarction): CVA/TIA: Patient presented with sudden onset expressive dysphasia,which has fluctuated over the last 24 hours. Imaging studies so far, have been negative, but patient is still symptomatic, and physical exam revealed mild right facial asymmetry. Will manage with antiplatelet medication, and do 2D Echocardiogram, to complete workup already initiated at [facility], but clearly, she will benefit from a neurology consultation. Meanwhile, will consult SLP.   2. HTN (hypertension): Patient has a known history of HTN, and BP is sub-optimally controlled at this time. Will reinstate pre-admission antihypertensives.   3. Dyslipidemia: Will continue statin treatment, and check lipid profile.   4. Hypothyroidism: continue thyroxine replacement therapy.   5. GERD (gastroesophageal reflux disease): Appears asymptomatic on PPI, which we shall continue.   6. History of back surgery: Patient is status post lumbar laminectomy, by Dr Loralie Champagne at [facility], in early June 2013,  complicated by wound infection, requiring I&D on 12/12/2011. She has completed 5 weeks of a total planned 6 weeks of antibiotic therapy. This will be continued. Patient has a RUE Mid-line.  Further management will depend on clinical course. Pat  Comment: Patient is Full Code.     Time Spent on Admission: 1 hour.   Jaydy Fitzhenry,CHRISTOPHER 01/16/2012, 5:22 PM

## 2012-01-16 NOTE — Progress Notes (Signed)
Patient c/o of vomiting. Entered room to assess. Pt vomiting and nauseated. Pt also c/o 8-9/10 pain in head. Also patient only oriented to self and speech is garbled, which was changed from my first assessment of the patient. Dr. Brien Few notified. Verbal order for 4mg  IV Zofran given and Dr. Brien Few ordered a STAT CT scan. IV Zofran given at 1743. Will continue to monitor.

## 2012-01-16 NOTE — Consult Note (Signed)
Reason for Consult:  Change in mental status, aphasia, Referring Physician: Dr. Merrie Roof Time of consult: 6:03 pm  01/16/2012  A STAT consult was placed by Dr. Hessie Knows  Charts, medications, labs and images reviewed CC:  Aphasia, change in mental status,   HPI:  This is an 66 y/o RHWF who underwent multiple back surgeries X 3  ( lumbar diskectomy) in June 2013  And then it got infected and had to go through again another lumbar procedure for I and D and another surgery for the same area. She was started on Hydrocodone for pain in addition to other sleeping aides. According to the daughter the patient has become progressively hypersomnolence and confused intermittently.  Patient is hypersomnolence but easily aroused, follows commands,  C/o of headaches, no nausea, no vomiting, no dizziness, no tinnitus, no  Blurry vision no diplopia, no photophobia, no phonophobia, no neck pain, no paresthesias, no Kaleidoscopic vision, no limb weakness, no  Hallucinations, no delusions. No chest pain, no sob, no abdominal pain, no loss of consciousness, no bladder or bowel incontinent, she is afebrile and on antibiotics since July 2013.  She was taken to the hospital yesterday from the rehab for confusion and hypersomnolence and also unable to participate in a conversation with her daughter and husband.  She had an MRI of the brain and Head CT done at the OSH Which were negative for ischemic and or hemorrhagic stroke.  I could not personally review the MRI because there were no disc present in the chart.  Patient seems to understand and follow commands and participates in an ongoing conversation. She intermittently falls asleep. She has akathisia   Past Medical History  Diagnosis Date  . Hypertension   . PONV (postoperative nausea and vomiting)     "violently; only thing that works for her is scopolamine patch "  . Hypothyroidism   . Anxiety   . GERD (gastroesophageal reflux disease)   . Chronic lower  back pain   . High cholesterol   . Headache 01/16/2012    "all day; think its related to TIA"  . TIA (transient ischemic attack) 01/16/2012    Past Surgical History  Procedure Date  . Back surgery   . Lumbar disc surgery 11/06/2011    "shaved down 2 bulging discs"  . Incision and drainage of wound 11/15/2011; early 12/2011    "abscess; S/P back OR"; "staph infection in wound"  . Peripherally inserted central catheter insertion ~ 12/07/2011    right upper arm  . Bladder suspension 1990's    "w/rectal suspensiion"  . Cholecystectomy ~ 2010    History reviewed. No pertinent family history.  Social History:  does not have a smoking history on file. She has never used smokeless tobacco. She reports that she does not drink alcohol or use illicit drugs.  No Known Allergies  Medications:  Scheduled:   . acetaminophen  1,000 mg Intravenous Q6H  . acidophilus  1 capsule Oral Daily  . aspirin  325 mg Oral Daily  . cefTRIAXone (ROCEPHIN)  IV  1 g Intravenous Q1500  . heparin  5,000 Units Subcutaneous Q8H  . irbesartan  75 mg Oral Daily  . levothyroxine  88 mcg Oral QAC breakfast  . multivitamin with minerals  1 tablet Oral Daily  . ondansetron      . pantoprazole  40 mg Oral Q0600  . scopolamine  1 patch Transdermal Q72H  . simvastatin  20 mg Oral q1800  . DISCONTD: cefTRIAXone (ROCEPHIN)  IV  1 g Intravenous Q24H    ROS: All the 14 point review of systems reviewed pertinent are mentioned in the HPI  Physical Examination: Blood pressure 170/61, pulse 61, temperature 97.9 F (36.6 C), temperature source Oral, resp. rate 17, SpO2 100.00%.  Neurologic Examination Mental Status: Alert, oriented, thought content appropriate.  Speech fluent without evidence of aphasia.  Able to follow 3 step commands without difficulty. Cranial Nerves: II: visual fields grossly normal, pupils equal, round, reactive to light and accommodation, roving eyes III,IV, VI: ptosis not present, extra-ocular  motions intact bilaterally V,VII: smile symmetric, facial light touch sensation normal bilaterally VIII: hearing normal bilaterally IX,X: gag reflex present XI: trapezius strength/neck flexion strength normal bilaterally XII: tongue strength normal  Motor: Right : Upper extremity   5/5    Left:     Upper extremity   5/5  Lower extremity   5/5     Lower extremity   5/5 Tone and bulk:normal tone throughout; no atrophy noted Sensory: Pinprick and light touch intact throughout, bilaterally Deep Tendon Reflexes: 2+ and symmetric throughout Plantars: Right: downgoing   Left: downgoing Cerebellar: normal finger-to-nose, normal rapid alternating movements and normal heel-to-shin test Gait; Not tested Extrapyramidal : negative  Akathisia is present Speech apraxia   Laboratory Studies:   Basic Metabolic Panel: No results found for this basename: NA:5,K:5,CL:5,CO2:5,GLUCOSE:5,BUN:5,CREATININE:5,CALCIUM:3,MG:5,PHOS:5 in the last 168 hours  Liver Function Tests: No results found for this basename: AST:5,ALT:5,ALKPHOS:5,BILITOT:5,PROT:5,ALBUMIN:5 in the last 168 hours No results found for this basename: LIPASE:5,AMYLASE:5 in the last 168 hours No results found for this basename: AMMONIA:3 in the last 168 hours  CBC: No results found for this basename: WBC:5,NEUTROABS:5,HGB:5,HCT:5,MCV:5,PLT:5 in the last 168 hours  Cardiac Enzymes: No results found for this basename: CKTOTAL:5,CKMB:5,CKMBINDEX:5,TROPONINI:5 in the last 168 hours  BNP: No components found with this basename: POCBNP:5  CBG: No results found for this basename: GLUCAP:5 in the last 168 hours  Microbiology: No results found for this or any previous visit.  Coagulation Studies: No results found for this basename: LABPROT:5,INR:5 in the last 72 hours  Urinalysis: No results found for this basename:  COLORURINE:2,APPERANCEUR:2,LABSPEC:2,PHURINE:2,GLUCOSEU:2,HGBUR:2,BILIRUBINUR:2,KETONESUR:2,PROTEINUR:2,UROBILINOGEN:2,NITRITE:2,LEUKOCYTESUR:2 in the last 168 hours  Lipid Panel:     Component Value Date/Time   CHOL  Value: 221        ATP III CLASSIFICATION:  <200     mg/dL   Desirable  045-409  mg/dL   Borderline High  >=811    mg/dL   High       * 01/31/4781 2015   TRIG 118 08/03/2009 2015   HDL 53 08/03/2009 2015   CHOLHDL 4.2 08/03/2009 2015   VLDL 24 08/03/2009 2015   LDLCALC  Value: 144        Total Cholesterol/HDL:CHD Risk Coronary Heart Disease Risk Table                     Men   Women  1/2 Average Risk   3.4   3.3  Average Risk       5.0   4.4  2 X Average Risk   9.6   7.1  3 X Average Risk  23.4   11.0        Use the calculated Patient Ratio above and the CHD Risk Table to determine the patient's CHD Risk.        ATP III CLASSIFICATION (LDL):  <100     mg/dL   Optimal  956-213  mg/dL   Near or Above  Optimal  130-159  mg/dL   Borderline  295-621  mg/dL   High  >308     mg/dL   Very High* 11/04/7844 9629    HgbA1C:  No results found for this basename: HGBA1C    Urine Drug Screen:   No results found for this basename: labopia, cocainscrnur, labbenz, amphetmu, thcu, labbarb    Alcohol Level: No results found for this basename: ETH:2 in the last 168 hours  Other results: EKG: normal EKG, normal sinus rhythm, unchanged from previous tracings.  Imaging: No results found.   Assessment/Plan:   66 y/o RHWF with multiple back to back lumbar disc surgeries. Transferred from OSH for confusion, disorientation and headaches. MRI of the brain and head CT are negative for any substantial etiology When examined the patient was following commands, I did not find any aphasia, or dysarthria no limb weakness, no other focal neurological deficits. Dr. Hessie Knows has already ordered a head CT. Will wait for the result This appears more of a Toxic encephalopathy and headaches from narcotics  and or withdrawal from narcotics   Recommendations: 1) Tylenol IV for pain 2) D/C dilaudid and ultram 3) Thiamine 300 mg IV  For 3 days 4) B complex BID  Shaylon Gillean V-P Eilleen Kempf., MD., Ph.D.,MS 01/16/2012 6:50 PM

## 2012-01-17 DIAGNOSIS — G459 Transient cerebral ischemic attack, unspecified: Secondary | ICD-10-CM | POA: Diagnosis present

## 2012-01-17 DIAGNOSIS — E039 Hypothyroidism, unspecified: Secondary | ICD-10-CM

## 2012-01-17 DIAGNOSIS — G9340 Encephalopathy, unspecified: Secondary | ICD-10-CM

## 2012-01-17 LAB — LIPID PANEL
Cholesterol: 138 mg/dL (ref 0–200)
Total CHOL/HDL Ratio: 3.1 RATIO
Triglycerides: 135 mg/dL (ref <150)
VLDL: 27 mg/dL (ref 0–40)

## 2012-01-17 MED ORDER — CALCIUM CARBONATE ANTACID 500 MG PO CHEW
1.0000 | CHEWABLE_TABLET | Freq: Four times a day (QID) | ORAL | Status: DC | PRN
  Administered 2012-01-17: 200 mg via ORAL
  Filled 2012-01-17: qty 1

## 2012-01-17 MED ORDER — ACETAMINOPHEN 325 MG PO TABS
650.0000 mg | ORAL_TABLET | Freq: Four times a day (QID) | ORAL | Status: DC
  Administered 2012-01-17 – 2012-01-18 (×2): 650 mg via ORAL
  Filled 2012-01-17 (×3): qty 2

## 2012-01-17 MED ORDER — SODIUM CHLORIDE 0.9 % IV SOLN
INTRAVENOUS | Status: DC
  Administered 2012-01-17: 14:00:00 via INTRAVENOUS
  Administered 2012-01-18: 1000 mL via INTRAVENOUS

## 2012-01-17 MED ORDER — SODIUM CHLORIDE 0.9 % IJ SOLN: 10.0000 mL | Freq: Two times a day (BID) | INTRAMUSCULAR | Status: DC

## 2012-01-17 MED ORDER — SODIUM CHLORIDE 0.9 % IJ SOLN: 10.0000 mL | INTRAMUSCULAR | Status: DC | PRN

## 2012-01-17 NOTE — Evaluation (Signed)
Physical Therapy Evaluation Patient Details Name: Jordan Higgins MRN: [id number] DOB: 1946/01/30 Today's Date: 01/17/2012 Time: 1212-1223 PT Time Calculation (min): 11 min  PT Assessment / Plan / Recommendation Clinical Impression  Pt admitted with AMS and slurred speech, found to be a toxicity to medication, r/o TIA & CVA. Pt is at her baseline functional level, no further acute PT needs. Will not follow    PT Assessment  Patent does not need any further PT services    Follow Up Recommendations  Skilled nursing facility    Barriers to Discharge        Equipment Recommendations  None recommended by PT;None recommended by OT    Recommendations for Other Services     Frequency      Precautions / Restrictions Precautions Precautions: None Precaution Comments: pt following back precautions from previous surgery Restrictions Weight Bearing Restrictions: No         Mobility  Bed Mobility Bed Mobility: Not assessed Transfers Transfers: Stand to Sit;Sit to Stand Sit to Stand: 6: Modified independent (Device/Increase time) Stand to Sit: 6: Modified independent (Device/Increase time) Ambulation/Gait Ambulation/Gait Assistance: 5: Supervision Ambulation Distance (Feet): 300 Feet Assistive device: None Ambulation/Gait Assistance Details: Supervision for safety although pt did not require any physical assist or cueing for safety. Pt intermittently uses cane at SNF, no need for AD this session Gait Pattern: Within Functional Limits Gait velocity: normal gait speed Modified Rankin (Stroke Patients Only) Pre-Morbid Rankin Score: No symptoms Modified Rankin: No symptoms     PT Goals Acute Rehab PT Goals PT Goal Formulation: With patient/family  Visit Information  Last PT Received On: 01/17/12 Assistance Needed: +1 PT/OT Co-Evaluation/Treatment: Yes    Subjective Data  Patient Stated Goal: to be back to normal   Prior Functioning  Home Living Lives With: Other  (Comment) Available Help at Discharge: Available 24 hours/day Type of Home: Skilled Nursing Facility Additional Comments: Completed rehab at [facility] and will return for IV antibiotics Prior Function Level of Independence: Independent Able to Take Stairs?: Yes Driving: Yes Vocation: Full time employment Communication Communication: No difficulties Dominant Hand: Right    Cognition  Overall Cognitive Status: Appears within functional limits for tasks assessed/performed Arousal/Alertness: Awake/alert Orientation Level: Appears intact for tasks assessed Behavior During Session: [facility] for tasks performed    Extremity/Trunk Assessment Right Upper Extremity Assessment RUE ROM/Strength/Tone: Within functional levels RUE Sensation: WFL - Light Touch;WFL - Proprioception RUE Coordination: WFL - gross/fine motor Left Upper Extremity Assessment LUE ROM/Strength/Tone: Within functional levels LUE Sensation: WFL - Light Touch;WFL - Proprioception LUE Coordination: WFL - gross/fine motor Right Lower Extremity Assessment RLE ROM/Strength/Tone: Within functional levels RLE Sensation: WFL - Light Touch Left Lower Extremity Assessment LLE ROM/Strength/Tone: Within functional levels LLE Sensation: WFL - Light Touch Trunk Assessment Trunk Assessment: Normal   Balance    End of Session PT - End of Session Equipment Utilized During Treatment: Gait belt Activity Tolerance: Patient tolerated treatment well Patient left: in chair;with call bell/phone within reach;with family/visitor present Nurse Communication: Mobility status    Milana Kidney 01/17/2012, 3:29 PM

## 2012-01-17 NOTE — Progress Notes (Signed)
Subjective: Patient seen at the bedside, she is awake, alert and follows commands. No headaches, no speech impairment , no diplopia, no nausea, no vomiting, no complaints. She rested well.   Objective: Current vital signs: BP 132/56  Pulse 58  Temp 97.9 F (36.6 C) (Oral)  Resp 18  SpO2 97% Vital signs in last 24 hours: Temp:  [97.9 F (36.6 C)-98.3 F (36.8 C)] 97.9 F (36.6 C) (08/17 1030) Pulse Rate:  [58-62] 58  (08/17 1030) Resp:  [17-18] 18  (08/17 1030) BP: (132-170)/(48-61) 132/56 mmHg (08/17 1030) SpO2:  [97 %-100 %] 97 % (08/17 1030)  Intake/Output from previous day:   Intake/Output this shift:   Nutritional status: Cardiac  Neurologic Exam: AAO Follows commands No motor or sensory deficits NO NEUROLOGICAL DEFICITS  Lab Results: Results for orders placed during the hospital encounter of 01/16/12 (from the past 48 hour(s))  CBC     Status: Abnormal   Collection Time   01/16/12  6:59 PM      Component Value Range Comment   WBC 10.0  4.0 - 10.5 K/uL    RBC 3.97  3.87 - 5.11 MIL/uL    Hemoglobin 10.6 (*) 12.0 - 15.0 g/dL    HCT 78.2 (*) 95.6 - 46.0 %    MCV 85.1  78.0 - 100.0 fL    MCH 26.7  26.0 - 34.0 pg    MCHC 31.4  30.0 - 36.0 g/dL    RDW 21.3 (*) 08.6 - 15.5 %    Platelets 354  150 - 400 K/uL   CREATININE, SERUM     Status: Abnormal   Collection Time   01/16/12  6:59 PM      Component Value Range Comment   Creatinine, Ser 0.85  0.50 - 1.10 mg/dL    GFR calc non Af Amer 69 (*) >90 mL/min    GFR calc Af Amer 80 (*) >90 mL/min   LIPID PANEL     Status: Normal   Collection Time   01/17/12  4:03 AM      Component Value Range Comment   Cholesterol 138  0 - 200 mg/dL    Triglycerides 578  <469 mg/dL    HDL 44  >62 mg/dL    Total CHOL/HDL Ratio 3.1      VLDL 27  0 - 40 mg/dL    LDL Cholesterol 67  0 - 99 mg/dL     No results found for this or any previous visit (from the past 240 hour(s)).  Lipid Panel  Basename 01/17/12 0403  CHOL 138  TRIG  135  HDL 44  CHOLHDL 3.1  VLDL 27  LDLCALC 67    Studies/Results: Ct Head Wo Contrast  01/16/2012  *RADIOLOGY REPORT*  Clinical Data: Headache.  Possible CVA.  Hypertension.  Transient ischemic attack.  CT HEAD WITHOUT CONTRAST  Technique:  Contiguous axial images were obtained from the base of the skull through the vertex without contrast.  Comparison: Head CT of 1 day prior.  Brain MR earlier today at 0750 hours.  Findings: Bone windows demonstrate fluid within the sphenoid sinus. Clear mastoid air cells.  Soft tissue windows demonstrate mildly age advanced cerebral atrophy.  This results in prominence of the extraaxial spaces, especially adjacent the frontal lobes.  Mild low density in the periventricular white matter likely related to small vessel disease. No  mass lesion, hemorrhage, hydrocephalus, acute infarct, intra-axial, or extra-axial fluid collection.  IMPRESSION:  1. No acute intracranial abnormality. 2. 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 3.  Sinus disease.  Original Report Authenticated By: Consuello Bossier, M.D.    Medications:  Scheduled:   . acetaminophen  1,000 mg Intravenous Q6H  . acidophilus  1 capsule Oral Daily  . aspirin  325 mg Oral Daily  . cefTRIAXone (ROCEPHIN)  IV  1 g Intravenous Q24H  . cefTRIAXone (ROCEPHIN)  IV  1 g Intravenous Once  . heparin  5,000 Units Subcutaneous Q8H  . irbesartan  75 mg Oral Daily  . levothyroxine  88 mcg Oral QAC breakfast  . multivitamin with minerals  1 tablet Oral Daily  . ondansetron      . pantoprazole  40 mg Oral Q0600  . scopolamine  1 patch Transdermal Q72H  . simvastatin  20 mg Oral q1800  . DISCONTD: cefTRIAXone (ROCEPHIN)  IV  1 g Intravenous Q24H  . DISCONTD: cefTRIAXone (ROCEPHIN)  IV  1 g Intravenous Q1500    Assessment/Plan:   Patient with change in mental status,  I believe she received pain medications around the clock She was showing signs of toxic encephalopathy .   After discontinuing all  the narcotics she is back to baseline  Recommendations: She is highly sensitive to narcotics. Would recommend only tylenol  Will sign off Cordelle Dahmen V-P Eilleen Kempf., MD., Ph.D.,MS 01/17/2012 10:39 AM

## 2012-01-17 NOTE — Progress Notes (Signed)
  Echocardiogram 2D Echocardiogram has been performed.  Emelia Loron 01/17/2012, 1:49 PM

## 2012-01-17 NOTE — Progress Notes (Signed)
Occupational Therapy Evaluation Patient Details Name: SHAKEERAH GRADEL MRN: [id number] DOB: [birth year] Today's Date: 01/17/2012 Time: 1207-1219 OT Time Calculation (min): 12 min  OT Assessment / Plan / Recommendation Clinical Impression  66 yo s/p back surgery @ 30 days ago, undergoing IV antibiotic therapy at SNF,  with recent admission due to CVA like symptoms. Pt with apparent drug toxicity. Pt does not present with functional impairments at this time. No further OT needs. OT signing off. Thank you for the referal.    OT Assessment  Patient does not need any further OT services    Follow Up Recommendations  No OT follow up    Barriers to Discharge  none    Equipment Recommendations  None recommended by OT    Recommendations for Other Services  To D/C to SNF to finish IV antibiotics  Frequency       Precautions / Restrictions Precautions Precautions: None Precaution Comments: pt following back precautions from previous surgery   Pertinent Vitals/Pain none    ADL  Eating/Feeding: Performed;Independent Grooming: Performed;Independent Upper Body Bathing: Simulated;Independent Where Assessed - Upper Body Bathing: Unsupported sitting Lower Body Bathing: Simulated;Modified independent Where Assessed - Lower Body Bathing: Unsupported sit to stand Upper Body Dressing: Simulated;Modified independent Where Assessed - Upper Body Dressing: Supported sitting Lower Body Dressing: Performed;Minimal assistance Where Assessed - Lower Body Dressing: Unsupported sit to stand (usually uses reacher) Toilet Transfer: Simulated;Modified independent Toilet Transfer Method: Sit to stand;Stand pivot Toilet Transfer Equipment: Comfort height toilet Toileting - Clothing Manipulation and Hygiene: Simulated;Modified independent Where Assessed - Toileting Clothing Manipulation and Hygiene: Standing Transfers/Ambulation Related to ADLs: mod I ADL Comments: able to verbalize back precautions and is mod  I with Ae    OT Diagnosis:    OT Problem List:   OT Treatment Interventions:     OT Goals Acute Rehab OT Goals OT Goal Formulation:  (eval only)  Visit Information  Last OT Received On: 01/17/12    Subjective Data      Prior Functioning  Vision/Perception  Home Living Lives With: Other (Comment) (currently are undergoing rehab at [facility] in [place]) Available Help at Discharge: Available 24 hours/day Type of Home: Skilled Nursing Facility Additional Comments: Completed rehab at [facility] and will return for IV antibiotics Prior Function Level of Independence: Independent Able to Take Stairs?: Yes Driving: Yes Vocation: Full time employment Communication Communication: No difficulties Dominant Hand: Right   Vision - Assessment Eye Alignment: Within Functional Limits Perception Perception: Within Functional Limits Praxis Praxis: Intact  Cognition  Overall Cognitive Status: Appears within functional limits for tasks assessed/performed Arousal/Alertness: Awake/alert Orientation Level: Appears intact for tasks assessed Behavior During Session: [facility] for tasks performed    Extremity/Trunk Assessment Right Upper Extremity Assessment RUE ROM/Strength/Tone: Within functional levels RUE Sensation: WFL - Light Touch;WFL - Proprioception RUE Coordination: WFL - gross/fine motor Left Upper Extremity Assessment LUE ROM/Strength/Tone: Within functional levels LUE Sensation: WFL - Light Touch;WFL - Proprioception LUE Coordination: WFL - gross/fine motor Right Lower Extremity Assessment RLE ROM/Strength/Tone: Within functional levels Left Lower Extremity Assessment LLE ROM/Strength/Tone: Within functional levels Trunk Assessment Trunk Assessment: Normal   Mobility Bed Mobility Bed Mobility: Not assessed Transfers Transfers: Sit to Stand;Stand to Sit Sit to Stand: 6: Modified independent (Device/Increase time) Stand to Sit: 6: Modified independent  (Device/Increase time)   Exercise    Balance  WFL  End of Session OT - End of Session Activity Tolerance: Patient tolerated treatment well Patient left: in chair;with call bell/phone within reach;with  family/visitor present Nurse Communication: Mobility status  GO     Puneet Selden,HILLARY 01/17/2012, 1:29 PM [facility], OTR/L  [phone] 01/17/2012

## 2012-01-17 NOTE — Progress Notes (Signed)
Brief Nutrition Note:  RD pulled to pt from nutrition risk report, "unintentional weight loss >10 lbs in 1 month" and comments of "probably".  RD has attempted to speak with pt but she is sleeping and does not wake to name call x3.  No height or weight is in computer at this time, appears well nourished. No weight hx on file for comparison. RD observed lunch tray at bedside, ~50% completion.  Per notes, pt passes swallow study at [facility] prior to transfer here. MD notes state pt tolerating diet with out vomiting today.   RD will follow up with pt as able to assess for nutrition needs.   Clarene Duke RD, LDN Pager [phone] After Hours pager [phone]

## 2012-01-17 NOTE — Progress Notes (Signed)
[facility] PROGRESS NOTE  Jordan Higgins QMV:[id number] DOB: Jan 10, 1946 DOA: 01/16/2012 PCP: No primary provider on file.  Assessment/Plan: Active Problems:  CVA (cerebral infarction)  HTN (hypertension)  Dyslipidemia  Hypothyroidism  GERD (gastroesophageal reflux disease)  History of back surgery   Assessment/Plan  Active Problems:  1. Query TIA: Patient presented with sudden onset expressive dysphasia, which has fluctuated. Imaging studies so far, have been negative. She continues on low dose ASA. Neurology consultation was requested for on-going symptoms, and was provided by Dr Minus Breeding, who has opined that patient's symptoms are consistent with a toxic encephalopathy, from opioid treatment/withdrawal, and has recommended avoidance of same. Pain was managed with Tylenol alone, and this AM, patient appears back to base-line. Opioids are to be avoided in the future. 2D Echocardiogram was ordered to complete workup already initiated at [facility], and is still pending.  2. HTN (hypertension): Patient has a known history of HTN, and BP was sub-optimally controlled at the time of initial evaluation. We reinstated pre-admission antihypertensives, with satisfactory control.  3. Dyslipidemia: On statin treatment. Lipid profile is excellent.  4. Hypothyroidism: Continued on thyroxine replacement therapy.  5. GERD (gastroesophageal reflux disease): Appears asymptomatic on PPI.  6. History of back surgery: Patient is status post lumbar laminectomy, by Dr Jordan Higgins at [facility], in early June 2013, complicated by wound infection, requiring I&D on 12/12/2011. She has completed 5 weeks of a total planned 6 weeks of antibiotic therapy. This has been continued, and will be concluded on 01/23/12. Patient has a RUE Mid-line.   Code Status: Full Code. Family Communication: Every aspect of care, discussed with spouse and daughter.  Disposition Plan: Aiming discharge on 01/18/12.     Brief narrative:  This is a 66 year old female, with known history of HTN, dyslipidemia, hypothyroidism, GERD, OA, anxiety,s/p gall bladder surgery, bladder and rectum suspension. Patient also has lumbo-sacral DJD and chronic back pain, and as a matter of fact, is s/p lumbar laminectomy, by Dr Jordan Higgins at [facility], in early June 2013, complicated by wound infection, requiring I&D on 12/12/2011. She was eventually discharged to [facility] for rehab, at the end of July, 2013, and has completed 5 weeks of the total planned 6-week course of iv Rocephin. On 01/15/12, At about 7:00 PM, while daughter was helping her get ready for bed, she had a sudden onset of expressive dysphasia, characterized by word-finding difficulties, and difficulty naming objects, but no visual obscuration, limb weakness or gait abnormality. She was evaluated in the ED at [facility], by Dr Dr Allena Earing, at reportedly, was asymptomatic at the time of this initial evaluation. She was admitted with a presumptive diagnosis of TIA, and underwent appropriate work up. Head CT of 01/15/12, showed no acute findings. Brain MRI of 01/16/12, also showed no acute infarct, although there was a small area of blood breakdown products in the left cerebellum. Vascular dopplers showed no hemodynamically significant ICA stenosis bilaterally, and vertebral artery flow was antegrade.12-lead EKG showed SR. These data were contained in patient records, delivered from [facility], and personally reviewed by me.   Consultants:  Dr Minus Breeding, neurologist.   Procedures:  Head CT scan  2D Echocardiogram.   Antibiotics:  N/A.  HPI/Subjective: Feels much better today. No evidence of dysphasia. Tolerating diet, no vomiting.   Objective: Vital signs in last 24 hours: Temp:  [97.9 F (36.6 C)-98.3 F (36.8 C)] 97.9 F (36.6 C) (08/17 1030) Pulse Rate:  [58-62] 58  (08/17 1030)  Resp:  [17-18] 18  (08/17 1030) BP:  (132-170)/(48-61) 132/56 mmHg (08/17 1030) SpO2:  [97 %-100 %] 97 % (08/17 1030) Weight change:     Intake/Output from previous day:       Physical Exam: General: sitting in a chair, comfortable, alert, communicative, talking in complete sentences, not short of breath at rest.  HEENT: Mild clinical pallor, no jaundice, no conjunctival injection or discharge. Hydration status is fair.  NECK: Supple, JVP not seen, no carotid bruits, no palpable lymphadenopathy, no palpable goiter.  CHEST: Clinically clear to auscultation, no wheezes, no crackles.  HEART: Sounds 1 and 2 heard, normal, regular, no murmurs.  ABDOMEN: Full, soft, non-tender, no palpable organomegaly, no palpable masses, normal bowel sounds. Surgical wound on lower back is well healed.  GENITALIA: Not examined.  LOWER EXTREMITIES: No pitting edema, palpable peripheral pulses.  MUSCULOSKELETAL SYSTEM: Generalized osteoarthritic changes, otherwise, normal.  CENTRAL NERVOUS SYSTEM: Patient has word-finding difficulties or other evidence of dysphasia today.   Lab Results:  [facility] 01/16/12 1859  WBC 10.0  HGB 10.6*  HCT 33.8*  PLT 354    Basename 01/16/12 1859  NA --  K --  CL --  CO2 --  GLUCOSE --  BUN --  CREATININE 0.85  CALCIUM --   No results found for this or any previous visit (from the past 240 hour(s)).   Studies/Results: Ct Head Wo Contrast  01/16/2012  *RADIOLOGY REPORT*  Clinical Data: Headache.  Possible CVA.  Hypertension.  Transient ischemic attack.  CT HEAD WITHOUT CONTRAST  Technique:  Contiguous axial images were obtained from the base of the skull through the vertex without contrast.  Comparison: Head CT of 1 day prior.  Brain MR earlier today at 0750 hours.  Findings: Bone windows demonstrate fluid within the sphenoid sinus. Clear mastoid air cells.  Soft tissue windows demonstrate mildly age advanced cerebral atrophy.  This results in prominence of the extraaxial spaces, especially adjacent  the frontal lobes.  Mild low density in the periventricular white matter likely related to small vessel disease. No  mass lesion, hemorrhage, hydrocephalus, acute infarct, intra-axial, or extra-axial fluid collection.  IMPRESSION:  1. No acute intracranial abnormality. 2. 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 3.  Sinus disease.  Original Report Authenticated By: Consuello Bossier, M.D.    Medications: Scheduled Meds:   . acetaminophen  1,000 mg Intravenous Q6H  . acidophilus  1 capsule Oral Daily  . aspirin  325 mg Oral Daily  . cefTRIAXone (ROCEPHIN)  IV  1 g Intravenous Q24H  . cefTRIAXone (ROCEPHIN)  IV  1 g Intravenous Once  . heparin  5,000 Units Subcutaneous Q8H  . irbesartan  75 mg Oral Daily  . levothyroxine  88 mcg Oral QAC breakfast  . multivitamin with minerals  1 tablet Oral Daily  . ondansetron      . pantoprazole  40 mg Oral Q0600  . scopolamine  1 patch Transdermal Q72H  . simvastatin  20 mg Oral q1800  . DISCONTD: cefTRIAXone (ROCEPHIN)  IV  1 g Intravenous Q24H  . DISCONTD: cefTRIAXone (ROCEPHIN)  IV  1 g Intravenous Q1500   Continuous Infusions:   . sodium chloride 1,000 mL (01/17/12 0014)   PRN Meds:.hydrALAZINE, ondansetron (ZOFRAN) IV, senna-docusate, DISCONTD: acetaminophen, DISCONTD: acetaminophen, DISCONTD:  HYDROmorphone (DILAUDID) injection, DISCONTD: oxyCODONE, DISCONTD: traMADol    LOS: 1 day   Jonahtan Manseau,CHRISTOPHER  [facility] Pager [phone]. If 8PM-8AM, please contact night-coverage at [email or web address], password [facility] 01/17/2012, 12:17 PM  LOS:  1 day

## 2012-01-17 NOTE — Discharge Summary (Addendum)
Physician Discharge Summary  Jordan Higgins JJO:[id number] DOB: 1945-10-25 DOA: 01/16/2012  PCP: No primary provider on file.  Admit date: 01/16/2012 Discharge date: 01/18/2012  Recommendations for Outpatient Follow-up:  1. Follow up with Primary MD. 2. Avoid opioid analgesics and benzodiazepines.   Discharge Diagnoses:  Active Problems:  HTN (hypertension)  Dyslipidemia  Hypothyroidism  GERD (gastroesophageal reflux disease)  History of back surgery  TIA (transient ischemic attack)  Encephalopathy   Discharge Condition: Satisfactory.  Diet recommendation: Heart-Healthy.  There were no vitals filed for this visit.  History of present illness:  This is a 66 year old female, with known history of HTN, dyslipidemia, hypothyroidism, GERD, OA, anxiety,s/p gall bladder surgery, bladder and rectum suspension. Patient also has lumbo-sacral DJD and chronic back pain, and as a matter of fact, is s/p lumbar laminectomy, by Dr Loralie Champagne at [facility], in early June 2013, complicated by wound infection, requiring I&D on 12/12/2011. She was eventually discharged to [facility] for rehab, at the end of July, 2013, and has completed 5 weeks of the total planned 6-week course of iv Rocephin. On 01/15/12, At about 7:00 PM, while daughter was helping her get ready for bed, she had a sudden onset of expressive dysphasia, characterized by word-finding difficulties, and difficulty naming objects, but no visual obscuration, limb weakness or gait abnormality. She was evaluated in the ED at [facility], by Dr Dr Allena Earing, at reportedly, was asymptomatic at the time of this initial evaluation. She was admitted with a presumptive diagnosis of TIA, and underwent appropriate work up. Head CT of 01/15/12, showed no acute findings. Brain MRI of 01/16/12, also showed no acute infarct, although there was a small area of blood breakdown products in the left cerebellum. Vascular dopplers showed no hemodynamically  significant ICA stenosis bilaterally, and vertebral artery flow was antegrade.12-lead EKG showed SR. These data were contained in patient records, delivered from [facility], and personally reviewed by me.    Hospital Course:  1. Query TIA: Patient presented with sudden onset expressive dysphasia, which fluctuated, despite negative brain imaging studies. She was continued on low dose ASA and neurology consultation was requested for on-going symptoms. This was provided by Dr Minus Breeding, who opined that patient's symptoms are consistent with a toxic encephalopathy, from opioid treatment/withdrawal, and has recommended avoidance of same. Pain was managed with Tylenol alone, and as of AM of 01/17/12, patient appeared back to base-line. Opioids are to be avoided in the future. 2D Echocardiogram was ordered to complete workup already initiated at [facility], and showed EF of 55% to 60%. There were no regional wall motion abnormalities. Neurologist has recommended 12.5 mg at bed time.  2. HTN (hypertension): Patient has a known history of HTN, and BP was sub-optimally controlled at the time of initial evaluation. We reinstated pre-admission antihypertensives, with satisfactory control.  3. Dyslipidemia: On statin treatment. Lipid profile showed TC 138, TG 135, HDL 44 and LDL 67.  4. Hypothyroidism: Patient was continued on thyroxine replacement therapy.  5. GERD (gastroesophageal reflux disease): This was not problematic on PPI.  6. History of back surgery: Patient is status post lumbar laminectomy, by Dr Loralie Champagne at [facility], in early June 2013, complicated by wound infection, requiring I&D on 12/12/2011. She has completed 5 weeks of a total planned 6 weeks of antibiotic therapy. This was been continued, and will be concluded on 01/23/12. Patient has a RUE Mid-line.   Procedures:  See below.   Consultations:  Dr Minus Breeding, neurologist.  Discharge Exam: Filed Vitals:   01/18/12  0603  BP: 154/78  Pulse: 81  Temp: 97.9 F (36.6 C)  Resp: 16   Filed Vitals:   01/17/12 2200 01/18/12 0200 01/18/12 0600 01/18/12 0603  BP: 147/56 154/78 140/51 154/78  Pulse: 60 81 68 81  Temp: 99.8 F (37.7 C) 97.9 F (36.6 C) 98.4 F (36.9 C) 97.9 F (36.6 C)  TempSrc:    Oral  Resp: 16 16 16 16   SpO2: 96% 95% 98% 95%    General: Comfortable, alert, communicative, talking in complete sentences, not short of breath at rest.  HEENT: Mild clinical pallor, no jaundice, no conjunctival injection or discharge. Hydration status is fair.  NECK: Supple, JVP not seen, no carotid bruits, no palpable lymphadenopathy, no palpable goiter.  CHEST: Clinically clear to auscultation, no wheezes, no crackles.  HEART: Sounds 1 and 2 heard, normal, regular, no murmurs.  ABDOMEN: Full, soft, non-tender, no palpable organomegaly, no palpable masses, normal bowel sounds. Surgical wound on lower back is well healed.  GENITALIA: Not examined.  LOWER EXTREMITIES: No pitting edema, palpable peripheral pulses.  MUSCULOSKELETAL SYSTEM: Generalized osteoarthritic changes, otherwise, normal.  CENTRAL NERVOUS SYSTEM: Patient has no word-finding difficulties or other evidence of dysphasia today and no focal neurologic deficit.    Discharge Instructions  Discharge Orders    Future Orders Please Complete By Expires   Diet - low sodium heart healthy      Increase activity slowly        Medication List  As of 01/18/2012 10:48 AM   STOP taking these medications         clorazepate 3.75 MG tablet      oxyCODONE 5 MG immediate release tablet      zolpidem 6.25 MG CR tablet         TAKE these medications         acetaminophen 500 MG tablet   Commonly known as: TYLENOL   Take 1,000 mg by mouth every 6 (six) hours as needed. For pain      aspirin 325 MG tablet   Take 1 tablet (325 mg total) by mouth daily.      dextrose 5 % SOLN 50 mL with cefTRIAXone 1 G SOLR 1 g   Inject 1 g into the vein  daily.      levothyroxine 88 MCG tablet   Commonly known as: SYNTHROID, LEVOTHROID   Take 88 mcg by mouth See admin instructions. Every day EXCEPT on Sunday.      multivitamin with minerals Tabs   Take 1 tablet by mouth daily.      omeprazole 10 MG capsule   Commonly known as: PRILOSEC   Take 10 mg by mouth daily.      PROBIOTIC DAILY PO   Take 1 capsule by mouth daily.      QUEtiapine 12.5 mg Tabs   Commonly known as: SEROQUEL   Take 0.5 tablets (12.5 mg total) by mouth at bedtime.      scopolamine 1.5 MG   Commonly known as: TRANSDERM-SCOP   Place 1 patch onto the skin every 3 (three) days.      senna-docusate 8.6-50 MG per tablet   Commonly known as: Senokot-S   Take 1 tablet by mouth at bedtime as needed.      simvastatin 20 MG tablet   Commonly known as: ZOCOR   Take 1 tablet (20 mg total) by mouth daily at 6 PM.      valsartan 80  MG tablet   Commonly known as: DIOVAN   Take 80 mg by mouth daily.           Follow-up Information    Please follow up. (Follow up with Primary MD. as needed)           The results of significant diagnostics from this hospitalization (including imaging, microbiology, ancillary and laboratory) are listed below for reference.    Significant Diagnostic Studies: Ct Head Wo Contrast  01/16/2012  *RADIOLOGY REPORT*  Clinical Data: Headache.  Possible CVA.  Hypertension.  Transient ischemic attack.  CT HEAD WITHOUT CONTRAST  Technique:  Contiguous axial images were obtained from the base of the skull through the vertex without contrast.  Comparison: Head CT of 1 day prior.  Brain MR earlier today at 0750 hours.  Findings: Bone windows demonstrate fluid within the sphenoid sinus. Clear mastoid air cells.  Soft tissue windows demonstrate mildly age advanced cerebral atrophy.  This results in prominence of the extraaxial spaces, especially adjacent the frontal lobes.  Mild low density in the periventricular white matter likely related to small  vessel disease. No  mass lesion, hemorrhage, hydrocephalus, acute infarct, intra-axial, or extra-axial fluid collection.  IMPRESSION:  1. No acute intracranial abnormality. 2. 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 3.  Sinus disease.  Original Report Authenticated By: Consuello Bossier, M.D.    Microbiology: No results found for this or any previous visit (from the past 240 hour(s)).   Labs: Basic Metabolic Panel:  Lab 01/16/12 4098  NA --  K --  CL --  CO2 --  GLUCOSE --  BUN --  CREATININE 0.85  CALCIUM --  MG --  PHOS --   Liver Function Tests: No results found for this basename: AST:5,ALT:5,ALKPHOS:5,BILITOT:5,PROT:5,ALBUMIN:5 in the last 168 hours No results found for this basename: LIPASE:5,AMYLASE:5 in the last 168 hours No results found for this basename: AMMONIA:5 in the last 168 hours CBC:  Lab 01/16/12 1859  WBC 10.0  NEUTROABS --  HGB 10.6*  HCT 33.8*  MCV 85.1  PLT 354   Cardiac Enzymes: No results found for this basename: CKTOTAL:5,CKMB:5,CKMBINDEX:5,TROPONINI:5 in the last 168 hours BNP: BNP (last 3 results) No results found for this basename: PROBNP:3 in the last 8760 hours CBG: No results found for this basename: GLUCAP:5 in the last 168 hours  Time coordinating discharge: 45 minutes  Signed:  Luxe Cuadros,CHRISTOPHER  [facility] 01/18/2012, 10:48 AM

## 2012-01-17 NOTE — Progress Notes (Signed)
Clinical Social Work Department BRIEF PSYCHOSOCIAL ASSESSMENT 01/17/2012  Patient:  Jordan Higgins, Jordan Higgins     Account Number:  [id number]     Admit date:  01/16/2012  Clinical Social Worker:  Skip Mayer  Date/Time:  01/17/2012 02:15 PM  Referred by:  RN  Date Referred:  01/17/2012 Referred for  Other - See comment   Other Referral:   From facility   Interview type:  Family Other interview type:    PSYCHOSOCIAL DATA Living Status:  FACILITY Admitted from facility:  [facility] Level of care:  Skilled Nursing Facility Primary support name:  Inetta Fermo Primary support relationship to patient:  CHILD, ADULT Degree of support available:   Adequate    CURRENT CONCERNS Current Concerns  Post-Acute Placement   Other Concerns:    SOCIAL WORK ASSESSMENT / PLAN CSW spoke with pt's dtr, Inetta Fermo [phone], re: d/c plan.  Pt admitted from [facility] and is able to return when stable, per Lanetta Inch at [facility] [phone].  Pt sleeping at time of visit.  Pt's dtr reports agreeable to pt return to SNF and that pt is agreeable as well.  CSW to complete FL2 and place on chart for MD signature.   Assessment/plan status:  Information/Referral to [facility] Other assessment/ plan:   Information/referral to community resources:   SNF    PATIENT'S/FAMILY'S RESPONSE TO PLAN OF CARE: Pt's dtr reports pt and pt's family are agreeable to pt return to [facility] in order to complete IV antibiotics.  Pt's dtr reports she will provide transport to SNF at d/c.        Dellie Burns, MSW, Theresia Majors [phone] (Weekends 8:00am-4:30pm) After Hours for on-call Clinical Social Work:  [email or web address] Password: [email or web address]

## 2012-01-18 DIAGNOSIS — G459 Transient cerebral ischemic attack, unspecified: Secondary | ICD-10-CM

## 2012-01-18 MED ORDER — QUETIAPINE 12.5 MG HALF TABLET: 12.5000 mg | ORAL_TABLET | Freq: Every day | ORAL | Status: DC

## 2012-01-18 MED ORDER — SENNOSIDES-DOCUSATE SODIUM 8.6-50 MG PO TABS: 1.0000 | ORAL_TABLET | Freq: Every evening | ORAL | Status: AC | PRN

## 2012-01-18 MED ORDER — ASPIRIN 325 MG PO TABS: 325.0000 mg | ORAL_TABLET | Freq: Every day | ORAL | Status: AC

## 2012-01-18 MED ORDER — ADULT MULTIVITAMIN W/MINERALS CH: 1.0000 | ORAL_TABLET | Freq: Every day | ORAL | Status: DC

## 2012-01-18 MED ORDER — QUETIAPINE 12.5 MG HALF TABLET
12.5000 mg | ORAL_TABLET | Freq: Every day | ORAL | Status: DC
  Filled 2012-01-18: qty 1

## 2012-01-18 MED ORDER — DEXTROSE 5 % IV SOLN: 1.0000 g | INTRAVENOUS | Status: AC

## 2012-01-18 MED ORDER — SIMVASTATIN 20 MG PO TABS: 20.0000 mg | ORAL_TABLET | Freq: Every day | ORAL | Status: DC

## 2012-01-18 NOTE — Progress Notes (Signed)
Subjective:  Patient with toxic encephalopathy. Has improved, Walked in the hallway, no more headaches, no chest pain, no motor or sensory deficits, no aphasia, no dysphagia and no back pain during ambulation .  No focal neurological deficits no other complaints.   Objective: Current vital signs: BP 154/78  Pulse 81  Temp 97.9 F (36.6 C) (Oral)  Resp 16  SpO2 95% Vital signs in last 24 hours: Temp:  [97.9 F (36.6 C)-99.8 F (37.7 C)] 97.9 F (36.6 C) (08/18 0603) Pulse Rate:  [60-81] 81  (08/18 0603) Resp:  [16-18] 16  (08/18 0603) BP: (126-154)/(48-78) 154/78 mmHg (08/18 0603) SpO2:  [95 %-100 %] 95 % (08/18 0603)  Intake/Output from previous day: 08/17 0701 - 08/18 0700 In: 840 [P.O.:840] Out: -  Intake/Output this shift: Total I/O In: 360 [P.O.:360] Out: -  Nutritional status: Cardiac  Neurologic Exam: No new deficits. No   Lab Results: Results for orders placed during the hospital encounter of 01/16/12 (from the past 48 hour(s))  CBC     Status: Abnormal   Collection Time   01/16/12  6:59 PM      Component Value Range Comment   WBC 10.0  4.0 - 10.5 K/uL    RBC 3.97  3.87 - 5.11 MIL/uL    Hemoglobin 10.6 (*) 12.0 - 15.0 g/dL    HCT 16.1 (*) 09.6 - 46.0 %    MCV 85.1  78.0 - 100.0 fL    MCH 26.7  26.0 - 34.0 pg    MCHC 31.4  30.0 - 36.0 g/dL    RDW 04.5 (*) 40.9 - 15.5 %    Platelets 354  150 - 400 K/uL   CREATININE, SERUM     Status: Abnormal   Collection Time   01/16/12  6:59 PM      Component Value Range Comment   Creatinine, Ser 0.85  0.50 - 1.10 mg/dL    GFR calc non Af Amer 69 (*) >90 mL/min    GFR calc Af Amer 80 (*) >90 mL/min   HEMOGLOBIN A1C     Status: Normal   Collection Time   01/17/12  4:03 AM      Component Value Range Comment   Hemoglobin A1C 5.5  <5.7 %    Mean Plasma Glucose 111  <117 mg/dL   LIPID PANEL     Status: Normal   Collection Time   01/17/12  4:03 AM      Component Value Range Comment   Cholesterol 138  0 - 200 mg/dL    Triglycerides 811  <914 mg/dL    HDL 44  >78 mg/dL    Total CHOL/HDL Ratio 3.1      VLDL 27  0 - 40 mg/dL    LDL Cholesterol 67  0 - 99 mg/dL     No results found for this or any previous visit (from the past 240 hour(s)).  Lipid Panel  Basename 01/17/12 0403  CHOL 138  TRIG 135  HDL 44  CHOLHDL 3.1  VLDL 27  LDLCALC 67    Studies/Results: Ct Head Wo Contrast  01/16/2012  *RADIOLOGY REPORT*  Clinical Data: Headache.  Possible CVA.  Hypertension.  Transient ischemic attack.  CT HEAD WITHOUT CONTRAST  Technique:  Contiguous axial images were obtained from the base of the skull through the vertex without contrast.  Comparison: Head CT of 1 day prior.  Brain MR earlier today at 0750 hours.  Findings: Bone windows demonstrate fluid within the sphenoid  sinus. Clear mastoid air cells.  Soft tissue windows demonstrate mildly age advanced cerebral atrophy.  This results in prominence of the extraaxial spaces, especially adjacent the frontal lobes.  Mild low density in the periventricular white matter likely related to small vessel disease. No  mass lesion, hemorrhage, hydrocephalus, acute infarct, intra-axial, or extra-axial fluid collection.  IMPRESSION:  1. No acute intracranial abnormality. 2. 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 3.  Sinus disease.  Original Report Authenticated By: Consuello Bossier, M.D.    Medications:  Scheduled:   . acetaminophen  650 mg Oral Q6H  . acidophilus  1 capsule Oral Daily  . aspirin  325 mg Oral Daily  . cefTRIAXone (ROCEPHIN)  IV  1 g Intravenous Q24H  . heparin  5,000 Units Subcutaneous Q8H  . irbesartan  75 mg Oral Daily  . levothyroxine  88 mcg Oral QAC breakfast  . multivitamin with minerals  1 tablet Oral Daily  . pantoprazole  40 mg Oral Q0600  . QUEtiapine  12.5 mg Oral QHS  . scopolamine  1 patch Transdermal Q72H  . simvastatin  20 mg Oral q1800  . sodium chloride  10-40 mL Intracatheter Q12H  . DISCONTD: acetaminophen  1,000 mg  Intravenous Q6H    Assessment/Plan:  Patient wants something to sleep and she agrees that Ambien has been compromising her memory.  Recommended 12.5 mg of seroquel  Mystery Schrupp V-P Eilleen Kempf., MD., Ph.D.,MS 01/18/2012 11:39 AM

## 2012-01-18 NOTE — Progress Notes (Signed)
Discharge instructions given to patient and daughter at bedside.  No questions at this time.  MD called in prescriptions to pharmacy of patient choice per AVS.  Patient left unit in wheelchair in stable condition with personal belongings.  Daughter has packet to give to Rehab facility, including prescription for IV antibiotics.  Osvaldo Angst, RN-----------------

## 2012-01-18 NOTE — Progress Notes (Signed)
Pt to return to [facility] today via family transport. D/C packet complete with chart copy, signed FL2, and Rx. CSW signing off as no other CSW needs identified.  Jordan Higgins, MSW, Theresia Majors [phone] (Weekends 8:00am-4:30pm) 5:00pm-8:00am for on-call Clinical Social Work:  [email or web address] Password: [email or web address]

## 2012-01-19 DIAGNOSIS — I119 Hypertensive heart disease without heart failure: Secondary | ICD-10-CM | POA: Diagnosis not present

## 2012-01-19 DIAGNOSIS — M6281 Muscle weakness (generalized): Secondary | ICD-10-CM | POA: Diagnosis not present

## 2012-01-19 DIAGNOSIS — E039 Hypothyroidism, unspecified: Secondary | ICD-10-CM | POA: Diagnosis not present

## 2012-01-19 DIAGNOSIS — S21209A Unspecified open wound of unspecified back wall of thorax without penetration into thoracic cavity, initial encounter: Secondary | ICD-10-CM | POA: Diagnosis not present

## 2012-01-19 DIAGNOSIS — K219 Gastro-esophageal reflux disease without esophagitis: Secondary | ICD-10-CM | POA: Diagnosis not present

## 2012-01-19 DIAGNOSIS — IMO0001 Reserved for inherently not codable concepts without codable children: Secondary | ICD-10-CM | POA: Diagnosis not present

## 2012-01-19 DIAGNOSIS — T781XXA Other adverse food reactions, not elsewhere classified, initial encounter: Secondary | ICD-10-CM | POA: Diagnosis not present

## 2012-01-19 DIAGNOSIS — I10 Essential (primary) hypertension: Secondary | ICD-10-CM | POA: Diagnosis not present

## 2012-01-19 DIAGNOSIS — D649 Anemia, unspecified: Secondary | ICD-10-CM | POA: Diagnosis not present

## 2012-01-19 DIAGNOSIS — G929 Unspecified toxic encephalopathy: Secondary | ICD-10-CM | POA: Diagnosis not present

## 2012-01-19 DIAGNOSIS — G92 Toxic encephalopathy: Secondary | ICD-10-CM | POA: Diagnosis not present

## 2012-01-19 DIAGNOSIS — E785 Hyperlipidemia, unspecified: Secondary | ICD-10-CM | POA: Diagnosis not present

## 2012-01-19 DIAGNOSIS — M1990 Unspecified osteoarthritis, unspecified site: Secondary | ICD-10-CM | POA: Diagnosis not present

## 2012-01-19 DIAGNOSIS — E7800 Pure hypercholesterolemia, unspecified: Secondary | ICD-10-CM | POA: Diagnosis not present

## 2012-01-21 MED FILL — Morphine Sulfate Inj 10 MG/ML: INTRAMUSCULAR | Qty: 1 | Status: AC

## 2012-01-22 DIAGNOSIS — I10 Essential (primary) hypertension: Secondary | ICD-10-CM | POA: Diagnosis not present

## 2012-01-22 DIAGNOSIS — E7800 Pure hypercholesterolemia, unspecified: Secondary | ICD-10-CM | POA: Diagnosis not present

## 2012-01-22 DIAGNOSIS — D649 Anemia, unspecified: Secondary | ICD-10-CM | POA: Diagnosis not present

## 2012-01-22 DIAGNOSIS — T781XXA Other adverse food reactions, not elsewhere classified, initial encounter: Secondary | ICD-10-CM | POA: Diagnosis not present

## 2012-01-28 DIAGNOSIS — Z48811 Encounter for surgical aftercare following surgery on the nervous system: Secondary | ICD-10-CM | POA: Diagnosis not present

## 2012-01-28 DIAGNOSIS — K219 Gastro-esophageal reflux disease without esophagitis: Secondary | ICD-10-CM | POA: Diagnosis not present

## 2012-01-28 DIAGNOSIS — I119 Hypertensive heart disease without heart failure: Secondary | ICD-10-CM | POA: Diagnosis not present

## 2012-01-28 DIAGNOSIS — R269 Unspecified abnormalities of gait and mobility: Secondary | ICD-10-CM | POA: Diagnosis not present

## 2012-01-28 DIAGNOSIS — R32 Unspecified urinary incontinence: Secondary | ICD-10-CM | POA: Diagnosis not present

## 2012-01-30 DIAGNOSIS — I519 Heart disease, unspecified: Secondary | ICD-10-CM | POA: Diagnosis not present

## 2012-01-30 DIAGNOSIS — A0472 Enterocolitis due to Clostridium difficile, not specified as recurrent: Secondary | ICD-10-CM | POA: Diagnosis not present

## 2012-01-30 DIAGNOSIS — Z48811 Encounter for surgical aftercare following surgery on the nervous system: Secondary | ICD-10-CM | POA: Diagnosis not present

## 2012-01-30 DIAGNOSIS — K219 Gastro-esophageal reflux disease without esophagitis: Secondary | ICD-10-CM | POA: Diagnosis not present

## 2012-01-30 DIAGNOSIS — R32 Unspecified urinary incontinence: Secondary | ICD-10-CM | POA: Diagnosis not present

## 2012-01-30 DIAGNOSIS — Z9181 History of falling: Secondary | ICD-10-CM | POA: Diagnosis not present

## 2012-01-30 DIAGNOSIS — I119 Hypertensive heart disease without heart failure: Secondary | ICD-10-CM | POA: Diagnosis not present

## 2012-02-03 DIAGNOSIS — M48061 Spinal stenosis, lumbar region without neurogenic claudication: Secondary | ICD-10-CM | POA: Diagnosis not present

## 2012-02-04 DIAGNOSIS — I119 Hypertensive heart disease without heart failure: Secondary | ICD-10-CM | POA: Diagnosis not present

## 2012-02-04 DIAGNOSIS — K219 Gastro-esophageal reflux disease without esophagitis: Secondary | ICD-10-CM | POA: Diagnosis not present

## 2012-02-04 DIAGNOSIS — R32 Unspecified urinary incontinence: Secondary | ICD-10-CM | POA: Diagnosis not present

## 2012-02-04 DIAGNOSIS — A0472 Enterocolitis due to Clostridium difficile, not specified as recurrent: Secondary | ICD-10-CM | POA: Diagnosis not present

## 2012-02-04 DIAGNOSIS — I519 Heart disease, unspecified: Secondary | ICD-10-CM | POA: Diagnosis not present

## 2012-02-04 DIAGNOSIS — Z48811 Encounter for surgical aftercare following surgery on the nervous system: Secondary | ICD-10-CM | POA: Diagnosis not present

## 2012-02-06 DIAGNOSIS — K219 Gastro-esophageal reflux disease without esophagitis: Secondary | ICD-10-CM | POA: Diagnosis not present

## 2012-02-06 DIAGNOSIS — A0472 Enterocolitis due to Clostridium difficile, not specified as recurrent: Secondary | ICD-10-CM | POA: Diagnosis not present

## 2012-02-06 DIAGNOSIS — I519 Heart disease, unspecified: Secondary | ICD-10-CM | POA: Diagnosis not present

## 2012-02-06 DIAGNOSIS — Z48811 Encounter for surgical aftercare following surgery on the nervous system: Secondary | ICD-10-CM | POA: Diagnosis not present

## 2012-02-06 DIAGNOSIS — R32 Unspecified urinary incontinence: Secondary | ICD-10-CM | POA: Diagnosis not present

## 2012-02-06 DIAGNOSIS — I119 Hypertensive heart disease without heart failure: Secondary | ICD-10-CM | POA: Diagnosis not present

## 2012-02-09 DIAGNOSIS — I519 Heart disease, unspecified: Secondary | ICD-10-CM | POA: Diagnosis not present

## 2012-02-09 DIAGNOSIS — K219 Gastro-esophageal reflux disease without esophagitis: Secondary | ICD-10-CM | POA: Diagnosis not present

## 2012-02-09 DIAGNOSIS — R32 Unspecified urinary incontinence: Secondary | ICD-10-CM | POA: Diagnosis not present

## 2012-02-09 DIAGNOSIS — Z48811 Encounter for surgical aftercare following surgery on the nervous system: Secondary | ICD-10-CM | POA: Diagnosis not present

## 2012-02-09 DIAGNOSIS — A0472 Enterocolitis due to Clostridium difficile, not specified as recurrent: Secondary | ICD-10-CM | POA: Diagnosis not present

## 2012-02-09 DIAGNOSIS — I119 Hypertensive heart disease without heart failure: Secondary | ICD-10-CM | POA: Diagnosis not present

## 2012-02-12 DIAGNOSIS — A0472 Enterocolitis due to Clostridium difficile, not specified as recurrent: Secondary | ICD-10-CM | POA: Diagnosis not present

## 2012-02-12 DIAGNOSIS — I119 Hypertensive heart disease without heart failure: Secondary | ICD-10-CM | POA: Diagnosis not present

## 2012-02-12 DIAGNOSIS — K219 Gastro-esophageal reflux disease without esophagitis: Secondary | ICD-10-CM | POA: Diagnosis not present

## 2012-02-12 DIAGNOSIS — Z48811 Encounter for surgical aftercare following surgery on the nervous system: Secondary | ICD-10-CM | POA: Diagnosis not present

## 2012-02-12 DIAGNOSIS — I519 Heart disease, unspecified: Secondary | ICD-10-CM | POA: Diagnosis not present

## 2012-02-12 DIAGNOSIS — R32 Unspecified urinary incontinence: Secondary | ICD-10-CM | POA: Diagnosis not present

## 2012-02-17 DIAGNOSIS — Z48811 Encounter for surgical aftercare following surgery on the nervous system: Secondary | ICD-10-CM | POA: Diagnosis not present

## 2012-02-17 DIAGNOSIS — K219 Gastro-esophageal reflux disease without esophagitis: Secondary | ICD-10-CM | POA: Diagnosis not present

## 2012-02-17 DIAGNOSIS — IMO0002 Reserved for concepts with insufficient information to code with codable children: Secondary | ICD-10-CM | POA: Diagnosis not present

## 2012-02-17 DIAGNOSIS — I119 Hypertensive heart disease without heart failure: Secondary | ICD-10-CM | POA: Diagnosis not present

## 2012-02-17 DIAGNOSIS — R32 Unspecified urinary incontinence: Secondary | ICD-10-CM | POA: Diagnosis not present

## 2012-02-17 DIAGNOSIS — I519 Heart disease, unspecified: Secondary | ICD-10-CM | POA: Diagnosis not present

## 2012-02-17 DIAGNOSIS — H04129 Dry eye syndrome of unspecified lacrimal gland: Secondary | ICD-10-CM | POA: Diagnosis not present

## 2012-02-17 DIAGNOSIS — A0472 Enterocolitis due to Clostridium difficile, not specified as recurrent: Secondary | ICD-10-CM | POA: Diagnosis not present

## 2012-02-24 DIAGNOSIS — K219 Gastro-esophageal reflux disease without esophagitis: Secondary | ICD-10-CM | POA: Diagnosis not present

## 2012-02-24 DIAGNOSIS — Z48811 Encounter for surgical aftercare following surgery on the nervous system: Secondary | ICD-10-CM | POA: Diagnosis not present

## 2012-02-24 DIAGNOSIS — A0472 Enterocolitis due to Clostridium difficile, not specified as recurrent: Secondary | ICD-10-CM | POA: Diagnosis not present

## 2012-02-24 DIAGNOSIS — I519 Heart disease, unspecified: Secondary | ICD-10-CM | POA: Diagnosis not present

## 2012-02-24 DIAGNOSIS — R32 Unspecified urinary incontinence: Secondary | ICD-10-CM | POA: Diagnosis not present

## 2012-02-24 DIAGNOSIS — I119 Hypertensive heart disease without heart failure: Secondary | ICD-10-CM | POA: Diagnosis not present

## 2012-03-02 DIAGNOSIS — K219 Gastro-esophageal reflux disease without esophagitis: Secondary | ICD-10-CM | POA: Diagnosis not present

## 2012-03-02 DIAGNOSIS — I119 Hypertensive heart disease without heart failure: Secondary | ICD-10-CM | POA: Diagnosis not present

## 2012-03-02 DIAGNOSIS — A0472 Enterocolitis due to Clostridium difficile, not specified as recurrent: Secondary | ICD-10-CM | POA: Diagnosis not present

## 2012-03-02 DIAGNOSIS — Z48811 Encounter for surgical aftercare following surgery on the nervous system: Secondary | ICD-10-CM | POA: Diagnosis not present

## 2012-03-02 DIAGNOSIS — I519 Heart disease, unspecified: Secondary | ICD-10-CM | POA: Diagnosis not present

## 2012-03-02 DIAGNOSIS — R32 Unspecified urinary incontinence: Secondary | ICD-10-CM | POA: Diagnosis not present

## 2012-05-04 DIAGNOSIS — M48061 Spinal stenosis, lumbar region without neurogenic claudication: Secondary | ICD-10-CM | POA: Diagnosis not present

## 2012-05-12 DIAGNOSIS — I10 Essential (primary) hypertension: Secondary | ICD-10-CM | POA: Diagnosis not present

## 2012-05-12 DIAGNOSIS — E038 Other specified hypothyroidism: Secondary | ICD-10-CM | POA: Diagnosis not present

## 2012-05-12 DIAGNOSIS — Z23 Encounter for immunization: Secondary | ICD-10-CM | POA: Diagnosis not present

## 2012-05-12 DIAGNOSIS — G4700 Insomnia, unspecified: Secondary | ICD-10-CM | POA: Diagnosis not present

## 2012-05-12 DIAGNOSIS — E7800 Pure hypercholesterolemia, unspecified: Secondary | ICD-10-CM | POA: Diagnosis not present

## 2012-05-12 DIAGNOSIS — Z79899 Other long term (current) drug therapy: Secondary | ICD-10-CM | POA: Diagnosis not present

## 2012-05-20 DIAGNOSIS — E7800 Pure hypercholesterolemia, unspecified: Secondary | ICD-10-CM | POA: Diagnosis not present

## 2012-05-20 DIAGNOSIS — E038 Other specified hypothyroidism: Secondary | ICD-10-CM | POA: Diagnosis not present

## 2012-05-20 DIAGNOSIS — Z79899 Other long term (current) drug therapy: Secondary | ICD-10-CM | POA: Diagnosis not present

## 2012-06-03 DIAGNOSIS — I10 Essential (primary) hypertension: Secondary | ICD-10-CM | POA: Diagnosis not present

## 2012-09-08 DIAGNOSIS — Z79899 Other long term (current) drug therapy: Secondary | ICD-10-CM | POA: Diagnosis not present

## 2012-09-08 DIAGNOSIS — E7800 Pure hypercholesterolemia, unspecified: Secondary | ICD-10-CM | POA: Diagnosis not present

## 2012-09-08 DIAGNOSIS — E038 Other specified hypothyroidism: Secondary | ICD-10-CM | POA: Diagnosis not present

## 2012-09-08 DIAGNOSIS — I10 Essential (primary) hypertension: Secondary | ICD-10-CM | POA: Diagnosis not present

## 2012-11-02 DIAGNOSIS — M48061 Spinal stenosis, lumbar region without neurogenic claudication: Secondary | ICD-10-CM | POA: Diagnosis not present

## 2013-01-04 DIAGNOSIS — Z1239 Encounter for other screening for malignant neoplasm of breast: Secondary | ICD-10-CM | POA: Diagnosis not present

## 2013-01-04 DIAGNOSIS — Z1289 Encounter for screening for malignant neoplasm of other sites: Secondary | ICD-10-CM | POA: Diagnosis not present

## 2013-01-04 DIAGNOSIS — R197 Diarrhea, unspecified: Secondary | ICD-10-CM | POA: Diagnosis not present

## 2013-01-06 DIAGNOSIS — R197 Diarrhea, unspecified: Secondary | ICD-10-CM | POA: Diagnosis not present

## 2013-01-13 DIAGNOSIS — Z1231 Encounter for screening mammogram for malignant neoplasm of breast: Secondary | ICD-10-CM | POA: Diagnosis not present

## 2013-04-19 DIAGNOSIS — Z23 Encounter for immunization: Secondary | ICD-10-CM | POA: Diagnosis not present

## 2013-04-19 DIAGNOSIS — E7800 Pure hypercholesterolemia, unspecified: Secondary | ICD-10-CM | POA: Diagnosis not present

## 2013-04-19 DIAGNOSIS — E038 Other specified hypothyroidism: Secondary | ICD-10-CM | POA: Diagnosis not present

## 2013-04-19 DIAGNOSIS — I10 Essential (primary) hypertension: Secondary | ICD-10-CM | POA: Diagnosis not present

## 2013-04-26 DIAGNOSIS — I10 Essential (primary) hypertension: Secondary | ICD-10-CM | POA: Diagnosis not present

## 2013-04-26 DIAGNOSIS — E038 Other specified hypothyroidism: Secondary | ICD-10-CM | POA: Diagnosis not present

## 2013-04-26 DIAGNOSIS — E7800 Pure hypercholesterolemia, unspecified: Secondary | ICD-10-CM | POA: Diagnosis not present

## 2013-05-03 DIAGNOSIS — F5102 Adjustment insomnia: Secondary | ICD-10-CM | POA: Diagnosis not present

## 2013-07-05 DIAGNOSIS — H04129 Dry eye syndrome of unspecified lacrimal gland: Secondary | ICD-10-CM | POA: Diagnosis not present

## 2013-07-05 DIAGNOSIS — H2589 Other age-related cataract: Secondary | ICD-10-CM | POA: Diagnosis not present

## 2013-07-11 DIAGNOSIS — Z0000 Encounter for general adult medical examination without abnormal findings: Secondary | ICD-10-CM | POA: Diagnosis not present

## 2013-07-11 DIAGNOSIS — E038 Other specified hypothyroidism: Secondary | ICD-10-CM | POA: Diagnosis not present

## 2013-07-11 DIAGNOSIS — N959 Unspecified menopausal and perimenopausal disorder: Secondary | ICD-10-CM | POA: Diagnosis not present

## 2013-07-11 DIAGNOSIS — I10 Essential (primary) hypertension: Secondary | ICD-10-CM | POA: Diagnosis not present

## 2013-07-11 DIAGNOSIS — E7800 Pure hypercholesterolemia, unspecified: Secondary | ICD-10-CM | POA: Diagnosis not present

## 2013-08-03 DIAGNOSIS — M545 Low back pain, unspecified: Secondary | ICD-10-CM | POA: Diagnosis not present

## 2013-08-04 DIAGNOSIS — M545 Low back pain, unspecified: Secondary | ICD-10-CM | POA: Diagnosis not present

## 2013-08-04 DIAGNOSIS — M5137 Other intervertebral disc degeneration, lumbosacral region: Secondary | ICD-10-CM | POA: Diagnosis not present

## 2013-08-04 DIAGNOSIS — IMO0002 Reserved for concepts with insufficient information to code with codable children: Secondary | ICD-10-CM | POA: Diagnosis not present

## 2013-08-29 DIAGNOSIS — J069 Acute upper respiratory infection, unspecified: Secondary | ICD-10-CM | POA: Diagnosis not present

## 2013-09-08 DIAGNOSIS — Z79899 Other long term (current) drug therapy: Secondary | ICD-10-CM | POA: Diagnosis not present

## 2013-09-08 DIAGNOSIS — R5381 Other malaise: Secondary | ICD-10-CM | POA: Diagnosis not present

## 2013-09-08 DIAGNOSIS — R5383 Other fatigue: Secondary | ICD-10-CM | POA: Diagnosis not present

## 2013-09-09 DIAGNOSIS — R5381 Other malaise: Secondary | ICD-10-CM | POA: Diagnosis not present

## 2013-09-09 DIAGNOSIS — R5383 Other fatigue: Secondary | ICD-10-CM | POA: Diagnosis not present

## 2013-09-09 DIAGNOSIS — Z79899 Other long term (current) drug therapy: Secondary | ICD-10-CM | POA: Diagnosis not present

## 2013-09-09 DIAGNOSIS — R05 Cough: Secondary | ICD-10-CM | POA: Diagnosis not present

## 2013-09-09 DIAGNOSIS — R059 Cough, unspecified: Secondary | ICD-10-CM | POA: Diagnosis not present

## 2013-09-09 IMAGING — CT CT HEAD W/O CM
1 series · 16 of 30 positions shown, 20 images · non-contrast
Comparison: Head CT of 1 day prior.  Brain MR earlier today at 2852
hours.

CLINICAL DATA: Headache.  Possible CVA.  Hypertension.  Transient
ischemic attack.

CT HEAD WITHOUT CONTRAST
TECHNIQUE: Contiguous axial images were obtained from the base of
the skull through the vertex without contrast.

[Series 2: head routine 4.8 h37s · axial · 0.43mm/px · z∈[-171,-42]mm · 16 of 30 slices shown, 20 images]
[im 2/30  brain]
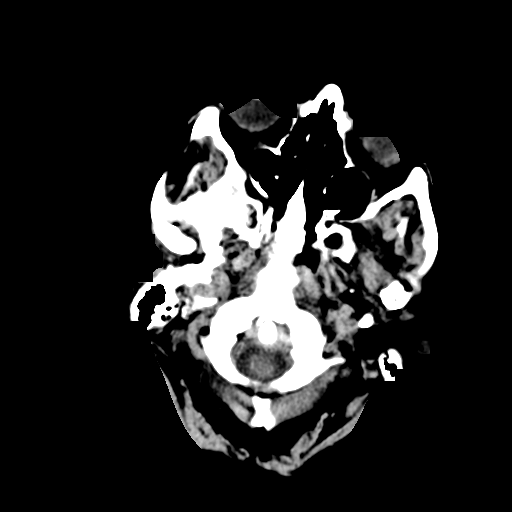
[im 2/30  bone]
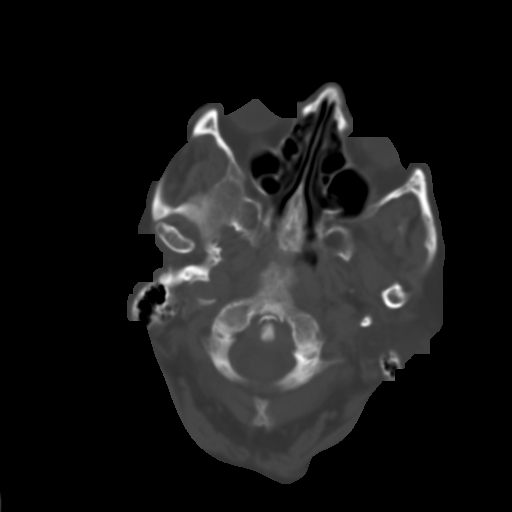
[im 4/30  brain]
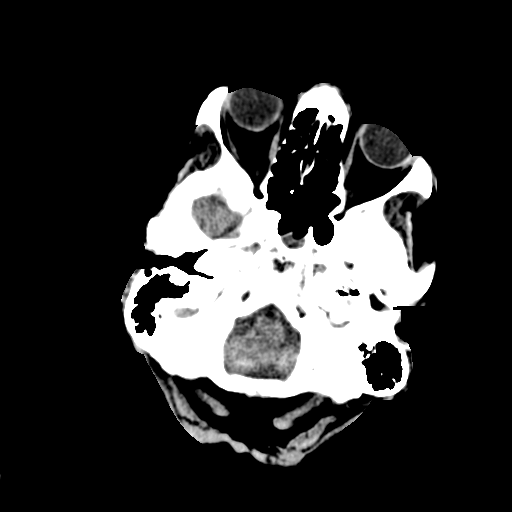
[im 6/30  brain]
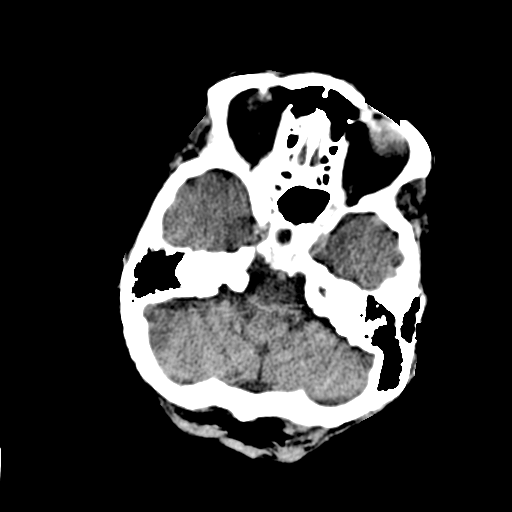
[im 8/30  brain]
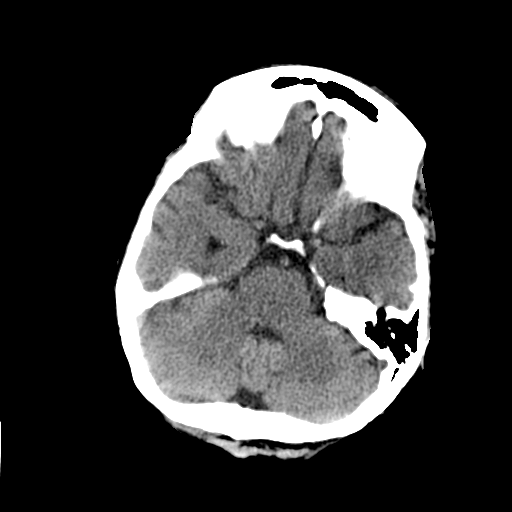
[im 9/30  brain]
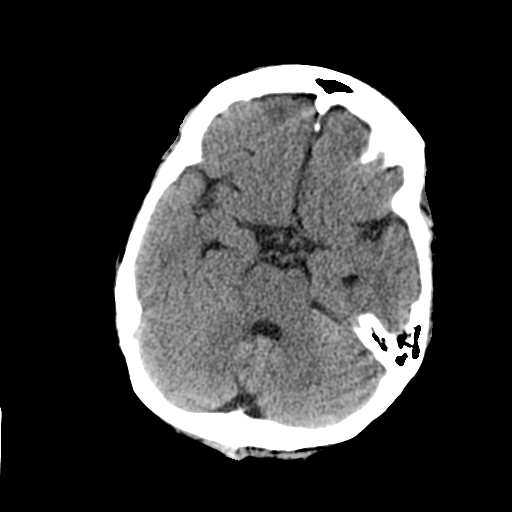
[im 9/30  bone]
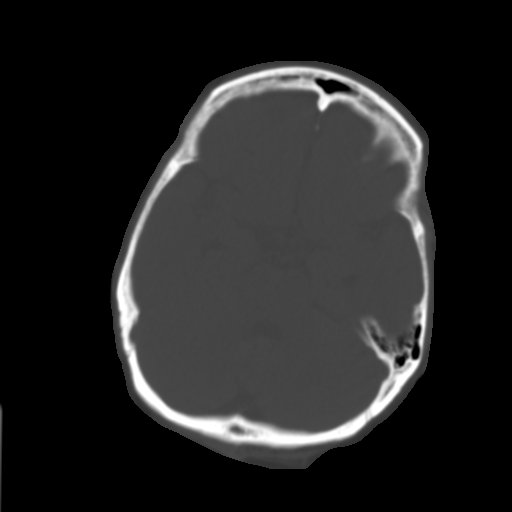
[im 11/30  brain]
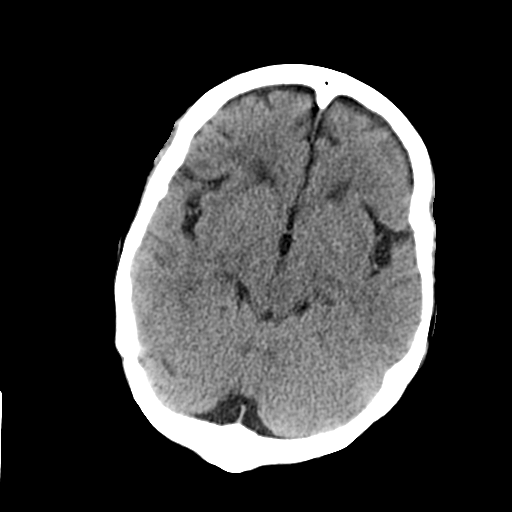
[im 13/30  brain]
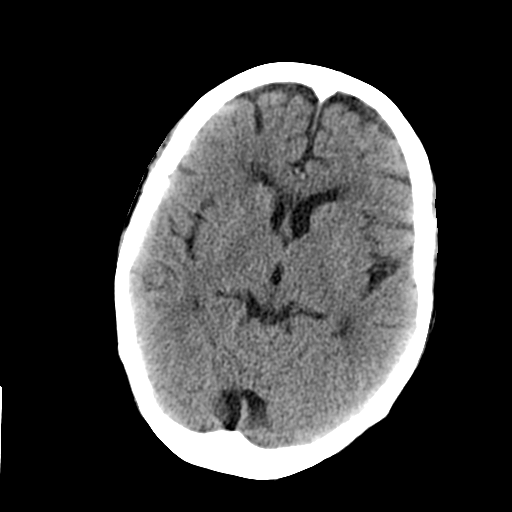
[im 15/30  brain]
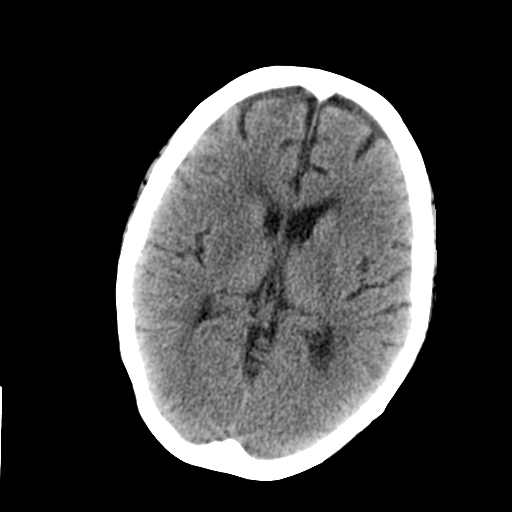
[im 16/30  brain]
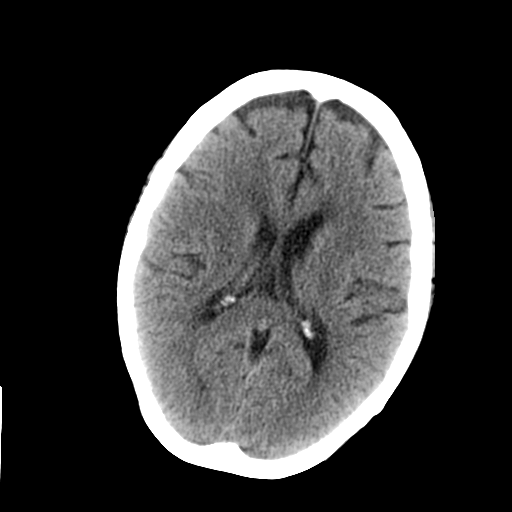
[im 16/30  bone]
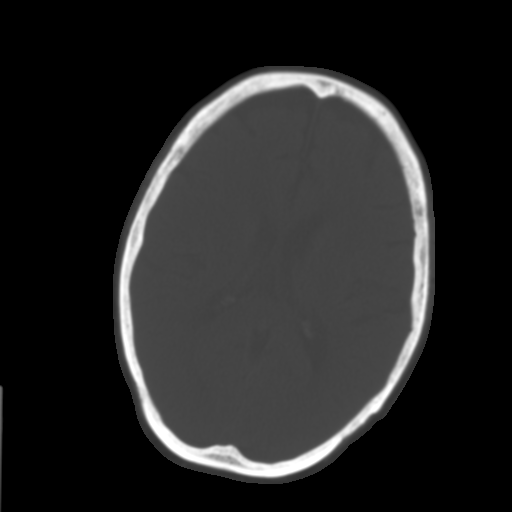
[im 18/30  brain]
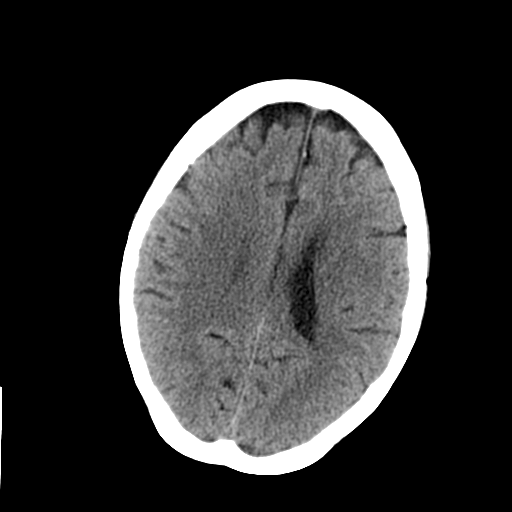
[im 20/30  brain]
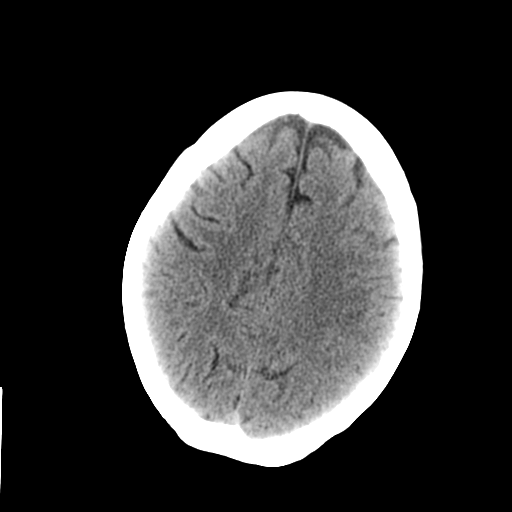
[im 22/30  brain]
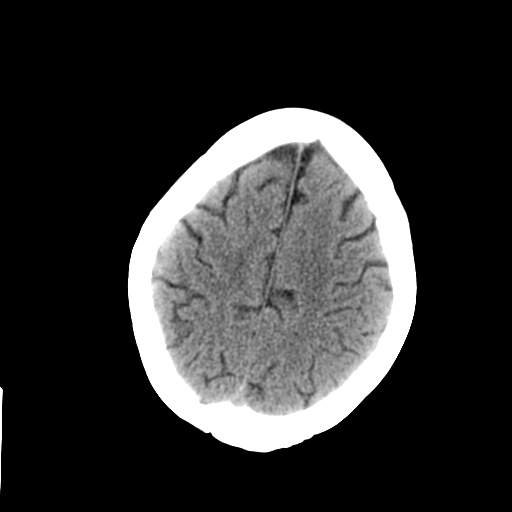
[im 23/30  brain]
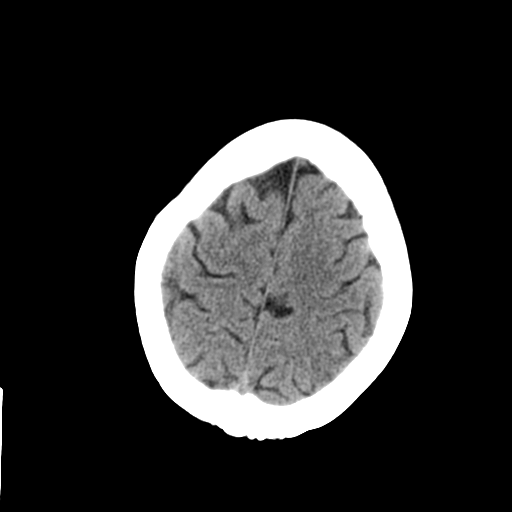
[im 23/30  bone]
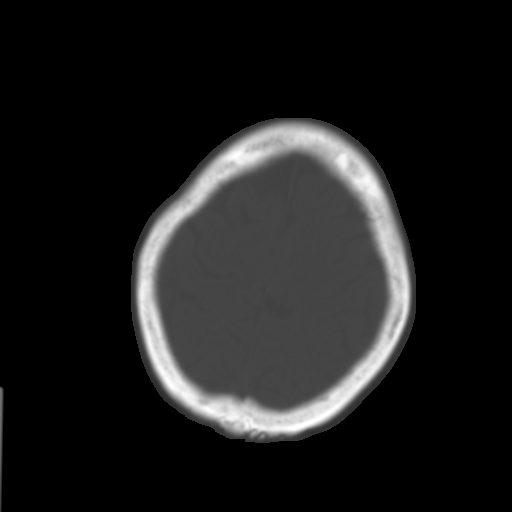
[im 25/30  brain]
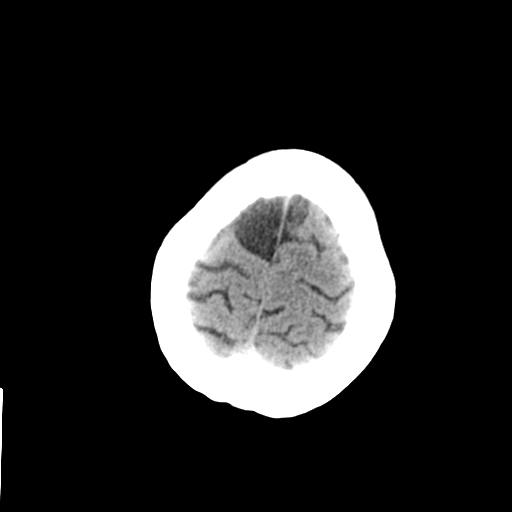
[im 27/30  brain]
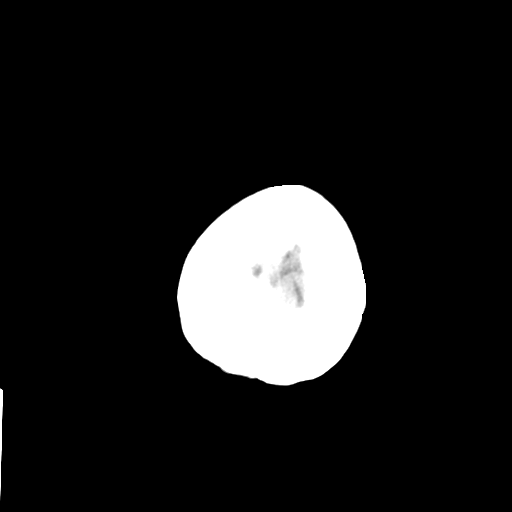
[im 29/30  brain]
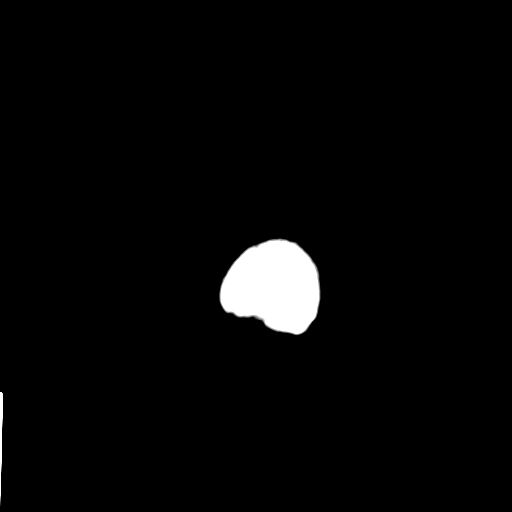

[16 of 30 positions shown; findings below may reference images not displayed]

FINDINGS: Bone windows demonstrate fluid within the sphenoid sinus.
Clear mastoid air cells.

Soft tissue windows demonstrate mildly age advanced cerebral
atrophy.  This results in prominence of the extraaxial spaces,
especially adjacent the frontal lobes.  Mild low density in the
periventricular white matter likely related to small vessel
disease. No  mass lesion, hemorrhage, hydrocephalus, acute infarct,
intra-axial, or extra-axial fluid collection.
IMPRESSION: 1. No acute intracranial abnormality.
2. Cerebral atrophy and small vessel ischemic change.
3.  Sinus disease.

## 2013-12-07 DIAGNOSIS — M545 Low back pain, unspecified: Secondary | ICD-10-CM | POA: Diagnosis not present

## 2013-12-07 DIAGNOSIS — R82998 Other abnormal findings in urine: Secondary | ICD-10-CM | POA: Diagnosis not present

## 2013-12-07 DIAGNOSIS — M5380 Other specified dorsopathies, site unspecified: Secondary | ICD-10-CM | POA: Diagnosis not present

## 2014-01-05 DIAGNOSIS — I10 Essential (primary) hypertension: Secondary | ICD-10-CM | POA: Diagnosis not present

## 2014-01-05 DIAGNOSIS — G4700 Insomnia, unspecified: Secondary | ICD-10-CM | POA: Diagnosis not present

## 2014-01-05 DIAGNOSIS — E038 Other specified hypothyroidism: Secondary | ICD-10-CM | POA: Diagnosis not present

## 2014-01-05 DIAGNOSIS — E7800 Pure hypercholesterolemia, unspecified: Secondary | ICD-10-CM | POA: Diagnosis not present

## 2014-01-16 DIAGNOSIS — M949 Disorder of cartilage, unspecified: Secondary | ICD-10-CM | POA: Diagnosis not present

## 2014-01-16 DIAGNOSIS — N959 Unspecified menopausal and perimenopausal disorder: Secondary | ICD-10-CM | POA: Diagnosis not present

## 2014-01-16 DIAGNOSIS — Z1231 Encounter for screening mammogram for malignant neoplasm of breast: Secondary | ICD-10-CM | POA: Diagnosis not present

## 2014-01-16 DIAGNOSIS — M899 Disorder of bone, unspecified: Secondary | ICD-10-CM | POA: Diagnosis not present

## 2014-01-16 DIAGNOSIS — Z13820 Encounter for screening for osteoporosis: Secondary | ICD-10-CM | POA: Diagnosis not present

## 2014-01-31 DIAGNOSIS — I10 Essential (primary) hypertension: Secondary | ICD-10-CM | POA: Diagnosis not present

## 2014-01-31 DIAGNOSIS — J0100 Acute maxillary sinusitis, unspecified: Secondary | ICD-10-CM | POA: Diagnosis not present

## 2014-03-28 DIAGNOSIS — Z23 Encounter for immunization: Secondary | ICD-10-CM | POA: Diagnosis not present

## 2014-06-15 DIAGNOSIS — I10 Essential (primary) hypertension: Secondary | ICD-10-CM | POA: Diagnosis not present

## 2014-06-15 DIAGNOSIS — K589 Irritable bowel syndrome without diarrhea: Secondary | ICD-10-CM | POA: Diagnosis not present

## 2014-06-15 DIAGNOSIS — E018 Other iodine-deficiency related thyroid disorders and allied conditions: Secondary | ICD-10-CM | POA: Diagnosis not present

## 2014-06-15 DIAGNOSIS — E780 Pure hypercholesterolemia: Secondary | ICD-10-CM | POA: Diagnosis not present

## 2014-06-15 DIAGNOSIS — E038 Other specified hypothyroidism: Secondary | ICD-10-CM | POA: Diagnosis not present

## 2014-10-03 DIAGNOSIS — H25813 Combined forms of age-related cataract, bilateral: Secondary | ICD-10-CM | POA: Diagnosis not present

## 2014-10-09 DIAGNOSIS — N3000 Acute cystitis without hematuria: Secondary | ICD-10-CM | POA: Diagnosis not present

## 2014-10-26 DIAGNOSIS — I10 Essential (primary) hypertension: Secondary | ICD-10-CM | POA: Diagnosis not present

## 2014-10-26 DIAGNOSIS — Z6833 Body mass index (BMI) 33.0-33.9, adult: Secondary | ICD-10-CM | POA: Diagnosis not present

## 2014-10-26 DIAGNOSIS — E038 Other specified hypothyroidism: Secondary | ICD-10-CM | POA: Diagnosis not present

## 2014-10-26 DIAGNOSIS — E669 Obesity, unspecified: Secondary | ICD-10-CM | POA: Diagnosis not present

## 2014-10-26 DIAGNOSIS — E780 Pure hypercholesterolemia: Secondary | ICD-10-CM | POA: Diagnosis not present

## 2014-10-26 DIAGNOSIS — Z0000 Encounter for general adult medical examination without abnormal findings: Secondary | ICD-10-CM | POA: Diagnosis not present

## 2014-10-26 DIAGNOSIS — Z23 Encounter for immunization: Secondary | ICD-10-CM | POA: Diagnosis not present

## 2014-12-11 DIAGNOSIS — M545 Low back pain: Secondary | ICD-10-CM | POA: Diagnosis not present

## 2014-12-11 DIAGNOSIS — M5136 Other intervertebral disc degeneration, lumbar region: Secondary | ICD-10-CM | POA: Diagnosis not present

## 2015-01-18 DIAGNOSIS — Z1231 Encounter for screening mammogram for malignant neoplasm of breast: Secondary | ICD-10-CM | POA: Diagnosis not present

## 2015-03-08 DIAGNOSIS — R5383 Other fatigue: Secondary | ICD-10-CM | POA: Diagnosis not present

## 2015-03-08 DIAGNOSIS — Z79899 Other long term (current) drug therapy: Secondary | ICD-10-CM | POA: Diagnosis not present

## 2015-03-08 DIAGNOSIS — R531 Weakness: Secondary | ICD-10-CM | POA: Diagnosis not present

## 2015-03-19 DIAGNOSIS — R531 Weakness: Secondary | ICD-10-CM | POA: Diagnosis not present

## 2015-03-19 DIAGNOSIS — D72829 Elevated white blood cell count, unspecified: Secondary | ICD-10-CM | POA: Diagnosis not present

## 2015-03-19 DIAGNOSIS — R5383 Other fatigue: Secondary | ICD-10-CM | POA: Diagnosis not present

## 2015-03-19 DIAGNOSIS — D72828 Other elevated white blood cell count: Secondary | ICD-10-CM | POA: Diagnosis not present

## 2015-03-20 DIAGNOSIS — R531 Weakness: Secondary | ICD-10-CM | POA: Diagnosis not present

## 2015-04-04 DIAGNOSIS — K644 Residual hemorrhoidal skin tags: Secondary | ICD-10-CM | POA: Diagnosis not present

## 2015-04-19 DIAGNOSIS — Z6832 Body mass index (BMI) 32.0-32.9, adult: Secondary | ICD-10-CM | POA: Diagnosis not present

## 2015-04-19 DIAGNOSIS — I10 Essential (primary) hypertension: Secondary | ICD-10-CM | POA: Diagnosis not present

## 2015-04-19 DIAGNOSIS — M549 Dorsalgia, unspecified: Secondary | ICD-10-CM | POA: Diagnosis not present

## 2015-04-19 DIAGNOSIS — Z1389 Encounter for screening for other disorder: Secondary | ICD-10-CM | POA: Diagnosis not present

## 2015-04-19 DIAGNOSIS — E663 Overweight: Secondary | ICD-10-CM | POA: Diagnosis not present

## 2015-04-19 DIAGNOSIS — E039 Hypothyroidism, unspecified: Secondary | ICD-10-CM | POA: Diagnosis not present

## 2015-05-08 DIAGNOSIS — Z79899 Other long term (current) drug therapy: Secondary | ICD-10-CM | POA: Diagnosis not present

## 2015-05-08 DIAGNOSIS — I10 Essential (primary) hypertension: Secondary | ICD-10-CM | POA: Diagnosis not present

## 2015-05-08 DIAGNOSIS — K5730 Diverticulosis of large intestine without perforation or abscess without bleeding: Secondary | ICD-10-CM | POA: Diagnosis not present

## 2015-05-08 DIAGNOSIS — Z9049 Acquired absence of other specified parts of digestive tract: Secondary | ICD-10-CM | POA: Diagnosis not present

## 2015-05-08 DIAGNOSIS — E039 Hypothyroidism, unspecified: Secondary | ICD-10-CM | POA: Diagnosis not present

## 2015-05-08 DIAGNOSIS — Z1211 Encounter for screening for malignant neoplasm of colon: Secondary | ICD-10-CM | POA: Diagnosis not present

## 2015-05-08 DIAGNOSIS — Z86010 Personal history of colonic polyps: Secondary | ICD-10-CM | POA: Diagnosis not present

## 2015-05-31 DIAGNOSIS — Z6832 Body mass index (BMI) 32.0-32.9, adult: Secondary | ICD-10-CM | POA: Diagnosis not present

## 2015-05-31 DIAGNOSIS — D473 Essential (hemorrhagic) thrombocythemia: Secondary | ICD-10-CM | POA: Diagnosis not present

## 2015-05-31 DIAGNOSIS — M549 Dorsalgia, unspecified: Secondary | ICD-10-CM | POA: Diagnosis not present

## 2015-07-03 DIAGNOSIS — M549 Dorsalgia, unspecified: Secondary | ICD-10-CM | POA: Diagnosis not present

## 2015-07-03 DIAGNOSIS — E785 Hyperlipidemia, unspecified: Secondary | ICD-10-CM | POA: Diagnosis not present

## 2015-07-03 DIAGNOSIS — Z6833 Body mass index (BMI) 33.0-33.9, adult: Secondary | ICD-10-CM | POA: Diagnosis not present

## 2015-07-03 DIAGNOSIS — D473 Essential (hemorrhagic) thrombocythemia: Secondary | ICD-10-CM | POA: Diagnosis not present

## 2015-07-19 DIAGNOSIS — I10 Essential (primary) hypertension: Secondary | ICD-10-CM | POA: Diagnosis not present

## 2015-07-19 DIAGNOSIS — Z79899 Other long term (current) drug therapy: Secondary | ICD-10-CM | POA: Diagnosis not present

## 2015-07-19 DIAGNOSIS — D473 Essential (hemorrhagic) thrombocythemia: Secondary | ICD-10-CM | POA: Diagnosis not present

## 2015-07-19 DIAGNOSIS — E039 Hypothyroidism, unspecified: Secondary | ICD-10-CM | POA: Diagnosis not present

## 2015-07-26 DIAGNOSIS — D473 Essential (hemorrhagic) thrombocythemia: Secondary | ICD-10-CM | POA: Diagnosis not present

## 2015-08-08 DIAGNOSIS — D473 Essential (hemorrhagic) thrombocythemia: Secondary | ICD-10-CM

## 2015-09-11 DIAGNOSIS — Z6832 Body mass index (BMI) 32.0-32.9, adult: Secondary | ICD-10-CM | POA: Diagnosis not present

## 2015-09-11 DIAGNOSIS — J069 Acute upper respiratory infection, unspecified: Secondary | ICD-10-CM | POA: Diagnosis not present

## 2015-09-28 DIAGNOSIS — E039 Hypothyroidism, unspecified: Secondary | ICD-10-CM | POA: Diagnosis not present

## 2015-09-28 DIAGNOSIS — I10 Essential (primary) hypertension: Secondary | ICD-10-CM | POA: Diagnosis not present

## 2015-09-28 DIAGNOSIS — D473 Essential (hemorrhagic) thrombocythemia: Secondary | ICD-10-CM | POA: Diagnosis not present

## 2015-09-28 DIAGNOSIS — Z6832 Body mass index (BMI) 32.0-32.9, adult: Secondary | ICD-10-CM | POA: Diagnosis not present

## 2015-09-28 DIAGNOSIS — E785 Hyperlipidemia, unspecified: Secondary | ICD-10-CM | POA: Diagnosis not present

## 2015-10-18 DIAGNOSIS — Z7982 Long term (current) use of aspirin: Secondary | ICD-10-CM | POA: Diagnosis not present

## 2015-10-18 DIAGNOSIS — D473 Essential (hemorrhagic) thrombocythemia: Secondary | ICD-10-CM | POA: Diagnosis not present

## 2015-12-05 DIAGNOSIS — Z6832 Body mass index (BMI) 32.0-32.9, adult: Secondary | ICD-10-CM | POA: Diagnosis not present

## 2015-12-05 DIAGNOSIS — J069 Acute upper respiratory infection, unspecified: Secondary | ICD-10-CM | POA: Diagnosis not present

## 2015-12-09 DIAGNOSIS — R531 Weakness: Secondary | ICD-10-CM | POA: Diagnosis not present

## 2015-12-09 DIAGNOSIS — R05 Cough: Secondary | ICD-10-CM | POA: Diagnosis not present

## 2015-12-09 DIAGNOSIS — K521 Toxic gastroenteritis and colitis: Secondary | ICD-10-CM | POA: Diagnosis not present

## 2015-12-09 DIAGNOSIS — E871 Hypo-osmolality and hyponatremia: Secondary | ICD-10-CM | POA: Diagnosis not present

## 2015-12-09 DIAGNOSIS — R197 Diarrhea, unspecified: Secondary | ICD-10-CM | POA: Diagnosis not present

## 2015-12-09 DIAGNOSIS — T3695XA Adverse effect of unspecified systemic antibiotic, initial encounter: Secondary | ICD-10-CM | POA: Diagnosis not present

## 2015-12-13 DIAGNOSIS — E860 Dehydration: Secondary | ICD-10-CM | POA: Diagnosis not present

## 2015-12-13 DIAGNOSIS — R05 Cough: Secondary | ICD-10-CM | POA: Diagnosis not present

## 2015-12-13 DIAGNOSIS — R531 Weakness: Secondary | ICD-10-CM | POA: Diagnosis not present

## 2015-12-13 DIAGNOSIS — R197 Diarrhea, unspecified: Secondary | ICD-10-CM | POA: Diagnosis not present

## 2015-12-13 DIAGNOSIS — R55 Syncope and collapse: Secondary | ICD-10-CM | POA: Diagnosis not present

## 2015-12-20 ENCOUNTER — Encounter: Payer: Self-pay | Admitting: Allergy and Immunology

## 2015-12-20 ENCOUNTER — Ambulatory Visit (INDEPENDENT_AMBULATORY_CARE_PROVIDER_SITE_OTHER): Payer: Medicare Other | Admitting: Allergy and Immunology

## 2015-12-20 VITALS — BP 132/82 | HR 68 | Temp 98.2°F | Resp 18 | Ht 61.1 in | Wt 171.6 lb

## 2015-12-20 DIAGNOSIS — A048 Other specified bacterial intestinal infections: Secondary | ICD-10-CM

## 2015-12-20 DIAGNOSIS — J387 Other diseases of larynx: Secondary | ICD-10-CM

## 2015-12-20 DIAGNOSIS — B9681 Helicobacter pylori [H. pylori] as the cause of diseases classified elsewhere: Secondary | ICD-10-CM | POA: Diagnosis not present

## 2015-12-20 DIAGNOSIS — K219 Gastro-esophageal reflux disease without esophagitis: Secondary | ICD-10-CM

## 2015-12-20 NOTE — Patient Instructions (Addendum)
  1. Get a urease breath test   2. After breath test start the following combination:   A. OTC omeprazole 20 mg 2 tablets in a.m.  B. OTC ranitidine 150 mg 2 tablets in PM  3. Attempt to consolidate all forms of caffeine  4. Can continue Claritin 10 mg once a day if needed  5. Will review all blood tests and x-rays and EKG from hospital  6. Further evaluation and treatment?  7. Return to clinic in 2 weeks

## 2015-12-20 NOTE — Progress Notes (Signed)
NEW PATIENT NOTE  Referring Provider: No ref. provider found Primary Provider: Laverna Peace, NP Date of office visit: 12/20/2015    Subjective:   Chief Complaint:  Jordan Higgins (DOB: Oct 14, 1945) is a 70 y.o. female with a chief complaint of Sinus Problem  who presents to the clinic on 12/20/2015 with the following problems:  HPI: Jordan Higgins presents this clinic in evaluation of problems that started on 12/04/2015. Apparently she developed rather acute onset of irritated throat and coughing and throat clearing and drainage stuck in her throat and raspy voice for which she sought out counseling with Dr. Delena Bali who treated her with a Kenalog injection and antibiotic. She is extremely weak throughout this event and did end up in the emergency room several days later with low sodium and was given fluids. She subsequently saw Amy Moon last week who transported her to the emergency room via ambulance and she received more fluids. Apparently her sodium was fine at that point in time. She also started Claritin. Today she is slightly better regarding her weakness but she still has throat clearing and postnasal drip and raspy voice. Her cough produces clear or white sputum but she's had no shortness of breath or chest tightness or chest pain or dyspnea on exertion and she has no issues with her head. Apparently her chest x-ray and blood tests and EKG and oxygen saturation were all normal at the emergency room.  It should be noted that she's been having stomach problems over the course of the past several weeks. The stomach problems are described as a "build up" in her chest with burping and some burning. This is actually been present somewhat since April. She takes Tums which does help this issue in the frequency of her tums use is about twice a week.  Past Medical History  Diagnosis Date  . Hypertension   . PONV (postoperative nausea and vomiting)     "violently; only thing that works for her is  scopolamine patch "  . Hypothyroidism   . Anxiety   . GERD (gastroesophageal reflux disease)   . Chronic lower back pain   . High cholesterol   . Headache(784.0) 01/16/2012    "all day; think its related to TIA"  . TIA (transient ischemic attack) 01/16/2012  . Essential thrombocythemia [facility])     Past Surgical History  Procedure Laterality Date  . Back surgery    . Lumbar disc surgery  11/06/2011    "shaved down 2 bulging discs"  . Incision and drainage of wound  11/15/2011; early 12/2011    "abscess; S/P back OR"; "staph infection in wound"  . Peripherally inserted central catheter insertion  ~ 12/07/2011    right upper arm  . Bladder suspension  1990's    "w/rectal suspensiion"  . Cholecystectomy  ~ 2010      Medication List           acetaminophen 500 MG tablet  Commonly known as:  TYLENOL  Take 1,000 mg by mouth every 6 (six) hours as needed. For pain     aspirin 81 MG tablet  Take 81 mg by mouth daily.     levothyroxine 88 MCG tablet  Commonly known as:  SYNTHROID, LEVOTHROID  Take 88 mcg by mouth See admin instructions. Every day EXCEPT on Sunday.     loratadine 10 MG tablet  Commonly known as:  CLARITIN  Take 10 mg by mouth daily as needed for allergies.     multivitamin  with minerals Tabs tablet  Take 1 tablet by mouth daily.     omeprazole 10 MG capsule  Commonly known as:  PRILOSEC  Take 10 mg by mouth daily.     PROBIOTIC DAILY PO  Take 1 capsule by mouth daily.     simvastatin 20 MG tablet  Commonly known as:  ZOCOR  Take 1 tablet (20 mg total) by mouth daily at 6 PM.     valsartan 80 MG tablet  Commonly known as:  DIOVAN  Take 80 mg by mouth daily.     zolpidem 5 MG tablet  Commonly known as:  AMBIEN  Take 5 mg by mouth at bedtime as needed for sleep.        No Known Allergies  Review of systems negative except as noted in HPI / PMHx or noted below:  Review of Systems  Constitutional: Negative.   HENT: Negative.   Eyes: Negative.     Respiratory: Negative.   Cardiovascular: Negative.   Gastrointestinal: Negative.   Genitourinary: Negative.   Musculoskeletal: Negative.   Skin: Negative.   Neurological: Negative.   Endo/Heme/Allergies: Negative.        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 Bobby Rumpf.  Psychiatric/Behavioral: Negative.     History reviewed. No pertinent family history.  Social History   Social History  . Marital Status: Married    Spouse Name: N/A  . Number of Children: N/A  . Years of Education: N/A   Occupational History  . Not on file.   Social History Main Topics  . Smoking status: Never Smoker   . Smokeless tobacco: Never Used  . Alcohol Use: No  . Drug Use: No  . Sexual Activity: Not on file   Other Topics Concern  . Not on file   Social History Narrative    Environmental and Social history  Lives in a house with a dry environment, no animals located inside the household, carpeting in the bedroom, plastic on the bed and pillow, and no smokers located inside the household.   Objective:   Filed Vitals:   12/20/15 1347  BP: 132/82  Pulse: 68  Temp: 98.2 F (36.8 C)  Resp: 18   Height: 5' 1.1" (155.2 cm) Weight: 171 lb 9.6 oz (77.837 kg)  Physical Exam  Constitutional: She is well-developed, well-nourished, and in no distress.  HENT:  Head: Normocephalic. Head is without right periorbital erythema and without left periorbital erythema.  Right Ear: Tympanic membrane, external ear and ear canal normal.  Left Ear: Tympanic membrane, external ear and ear canal normal.  Nose: Nose normal. No mucosal edema or rhinorrhea.  Mouth/Throat: Oropharynx is clear and moist and mucous membranes are normal. No oropharyngeal exudate.  Large septal perforation anteriorly  Eyes: Conjunctivae and lids are normal. Pupils are equal, round, and reactive to light.  Neck:  Trachea normal. No tracheal deviation present. No thyromegaly present.  Cardiovascular: Normal rate, regular rhythm, S1 normal, S2 normal and normal heart sounds.   No murmur heard. Pulmonary/Chest: Effort normal. No stridor. No tachypnea. No respiratory distress. She has no wheezes. She has no rales. She exhibits no tenderness.  Abdominal: Soft. She exhibits no distension and no mass. There is no hepatosplenomegaly. There is no tenderness. There is no rebound and no guarding.  Musculoskeletal: She exhibits no edema or tenderness.  Lymphadenopathy:       Head (right side):  No tonsillar adenopathy present.       Head (left side): No tonsillar adenopathy present.    She has no cervical adenopathy.    She has no axillary adenopathy.  Neurological: She is alert. Gait normal.  Skin: No rash noted. She is not diaphoretic. No erythema. No pallor. Nails show no clubbing.  Psychiatric: Mood and affect normal.     Diagnostics:  None     Assessment and Plan:    1. Helicobacter pylori (H. pylori) infection   2. LPRD (laryngopharyngeal reflux disease)     1. Get a urease breath test at [facility]  2. After breath test start the following combination:   A. OTC omeprazole 20 mg 2 tablets in a.m.  B. OTC ranitidine 150 mg 2 tablets in PM  3. Attempt to consolidate all forms of caffeine  4. Can continue Claritin 10 mg once a day if needed  5. Will review all blood tests and x-rays and EKG from hospital  6. Further evaluation and treatment?  7. Return to clinic in 2 weeks  Obviously Jordan Higgins has some type of problem giving rise to her weakness and her throat irritation which is most likely some type of infectious disease. Given the fact that she's had lots of GI issues in association with her throat issue I will make the assumption that she may be infected with Helicobacter pylori and rule out this issue by checking a urease breath test and we'll treat her empirically for reflux as noted above.  I'll hold off on any further evaluation or treatment for other etiologic factors that may be responsible for her symptoms at this point. She has a jak 2 mutation giving rise to her essential thrombocythemia and there may be some subtle immune defects that predispose her to the development of unusual infections but once again I will hold off on any further evaluation at this point until we can see what response we get over the course the next several weeks.  Allena Katz, MD [place]

## 2015-12-24 LAB — H. PYLORI BREATH TEST: H. pylori UBiT: NEGATIVE

## 2015-12-25 DIAGNOSIS — Z7982 Long term (current) use of aspirin: Secondary | ICD-10-CM | POA: Diagnosis not present

## 2015-12-25 DIAGNOSIS — D473 Essential (hemorrhagic) thrombocythemia: Secondary | ICD-10-CM | POA: Diagnosis not present

## 2016-01-03 DIAGNOSIS — E039 Hypothyroidism, unspecified: Secondary | ICD-10-CM | POA: Diagnosis not present

## 2016-01-03 DIAGNOSIS — I10 Essential (primary) hypertension: Secondary | ICD-10-CM | POA: Diagnosis not present

## 2016-01-03 DIAGNOSIS — G4700 Insomnia, unspecified: Secondary | ICD-10-CM | POA: Diagnosis not present

## 2016-01-03 DIAGNOSIS — E871 Hypo-osmolality and hyponatremia: Secondary | ICD-10-CM | POA: Diagnosis not present

## 2016-01-03 DIAGNOSIS — D473 Essential (hemorrhagic) thrombocythemia: Secondary | ICD-10-CM | POA: Diagnosis not present

## 2016-01-10 ENCOUNTER — Ambulatory Visit (INDEPENDENT_AMBULATORY_CARE_PROVIDER_SITE_OTHER): Payer: Medicare Other | Admitting: Allergy and Immunology

## 2016-01-10 ENCOUNTER — Encounter: Payer: Self-pay | Admitting: Allergy and Immunology

## 2016-01-10 VITALS — BP 152/78 | HR 68 | Resp 16

## 2016-01-10 DIAGNOSIS — K219 Gastro-esophageal reflux disease without esophagitis: Secondary | ICD-10-CM

## 2016-01-10 DIAGNOSIS — J387 Other diseases of larynx: Secondary | ICD-10-CM

## 2016-01-10 NOTE — Progress Notes (Signed)
Follow-up Note  Referring Provider: Lowella Dandy, NP Primary Provider: Laverna Peace, NP Date of Office Visit: 01/10/2016  Subjective:   Jordan Higgins (DOB: Jan 12, 1946) is a 70 y.o. female who returns to the [place] on 01/10/2016 in re-evaluation of the following:  HPI: Jordan Higgins presents to this clinic in reevaluation of her respiratory tract symptoms that were addressed and 12/20/2015. She has completely resolved all of her respiratory tract issues. She does not have any problems with throat clearing or irritated throat or coughing or drainage stuck in her throat or raspy voice and she has no problems with her stomach.   However, she was doing so well that she stopped all of her medications and last night she ate spaghetti and had a reflux episode and her throat is all messed up with raspiness and throat clearing.  Jordan Higgins has started hydroxyurea in the treatment of her essential thrombocytopenia    Medication List      acetaminophen 500 MG tablet Commonly known as:  TYLENOL Take 1,000 mg by mouth every 6 (six) hours as needed. For pain   aspirin 81 MG tablet Take 81 mg by mouth daily.   hydroxyurea 500 MG capsule Commonly known as:  HYDREA Take 1 capsule by mouth daily.   levothyroxine 88 MCG tablet Commonly known as:  SYNTHROID, LEVOTHROID Take 88 mcg by mouth See admin instructions. Every day EXCEPT on Sunday.   loratadine 10 MG tablet Commonly known as:  CLARITIN Take 10 mg by mouth daily as needed for allergies.   multivitamin with minerals Tabs tablet Take 1 tablet by mouth daily.   omeprazole 20 MG capsule Commonly known as:  PRILOSEC Take 40 mg by mouth daily.   PROBIOTIC DAILY PO Take 1 capsule by mouth daily.   ranitidine 150 MG tablet Commonly known as:  ZANTAC Take 300 mg by mouth at bedtime.   valsartan 80 MG tablet Commonly known as:  DIOVAN Take 80 mg by mouth daily.   zolpidem 5 MG tablet Commonly known as:  AMBIEN Take 5 mg  by mouth at bedtime as needed for sleep.       Past Medical History:  Diagnosis Date  . Anxiety   . Chronic lower back pain   . Essential thrombocythemia ([place])   . GERD (gastroesophageal reflux disease)   . Headache(784.0) 01/16/2012   "all day; think its related to TIA"  . High cholesterol   . Hypertension   . Hypothyroidism   . PONV (postoperative nausea and vomiting)    "violently; only thing that works for her is scopolamine patch "  . TIA (transient ischemic attack) 01/16/2012    Past Surgical History:  Procedure Laterality Date  . BACK SURGERY    . BLADDER SUSPENSION  1990's   "w/rectal suspensiion"  . CHOLECYSTECTOMY  ~ 2010  . INCISION AND DRAINAGE OF WOUND  11/15/2011; early 12/2011   "abscess; S/P back OR"; "staph infection in wound"  . LUMBAR DISC SURGERY  11/06/2011   "shaved down 2 bulging discs"  . PERIPHERALLY INSERTED CENTRAL CATHETER INSERTION  ~ 12/07/2011   right upper arm    No Known Allergies  Review of systems negative except as noted in HPI / PMHx or noted below:  Review of Systems  Constitutional: Negative.   HENT: Negative.   Eyes: Negative.   Respiratory: Negative.   Cardiovascular: Negative.   Gastrointestinal: Negative.   Genitourinary: Negative.   Musculoskeletal: Negative.   Skin: Negative.  Neurological: Negative.   Endo/Heme/Allergies: Negative.   Psychiatric/Behavioral: Negative.      Objective:   Vitals:   01/10/16 1523  BP: (!) 152/78  Pulse: 68  Resp: 16          Physical Exam  Constitutional: She is well-developed, well-nourished, and in no distress.  Throat clearing, raspy voice  HENT:  Head: Normocephalic.  Right Ear: Tympanic membrane, external ear and ear canal normal.  Left Ear: Tympanic membrane, external ear and ear canal normal.  Nose: Nose normal. No mucosal edema or rhinorrhea.  Mouth/Throat: Uvula is midline, oropharynx is clear and moist and mucous membranes are normal. No oropharyngeal exudate.    Eyes: Conjunctivae are normal.  Neck: Trachea normal. No tracheal tenderness present. No tracheal deviation present. No thyromegaly present.  Cardiovascular: Normal rate, regular rhythm, S1 normal, S2 normal and normal heart sounds.   No murmur heard. Pulmonary/Chest: Breath sounds normal. No stridor. No respiratory distress. She has no wheezes. She has no rales.  Lymphadenopathy:       Head (right side): No tonsillar adenopathy present.       Head (left side): No tonsillar adenopathy present.    She has no cervical adenopathy.  Neurological: She is alert. Gait normal.  Skin: No rash noted. She is not diaphoretic. No erythema. Nails show no clubbing.  Psychiatric: Mood and affect normal.    Diagnostics:  Her Helicobacter pylori urease breath test obtained on 12/20/2015 was negative  Assessment and Plan:   1. LPRD (laryngopharyngeal reflux disease)     1. Continue the following combination:   A. OTC omeprazole 20 mg 2 tablets in a.m.  B. OTC ranitidine 150 mg 2 tablets in PM  2. Continue to consolidate all forms of caffeine  3. Can continue Claritin 10 mg once a day if needed  4. If doing well can attempt to discontinue reflux therapy Thanksgiving 2017  5. Obtain fall flu vaccine   I've informed Jordan Higgins that she needs to use her antireflux medicines for at least 3 months to completely clear up all the inflammation and irritation of her mid airway. If she is doing well at Thanksgiving she can attempt to discontinue these agents. I'll be very happy to see her back in this clinic should she have significant problem in the face of this therapy. She will follow up in this clinic as needed.  Allena Katz, MD [place]

## 2016-01-10 NOTE — Patient Instructions (Signed)
  1. Continue the following combination:   A. OTC omeprazole 20 mg 2 tablets in a.m.  B. OTC ranitidine 150 mg 2 tablets in PM  2. Continue to consolidate all forms of caffeine  3. Can continue Claritin 10 mg once a day if needed  4. If doing well can attempt to discontinue reflux therapy Thanksgiving 2017  5. Obtain fall flu vaccine

## 2016-01-18 ENCOUNTER — Encounter: Payer: Self-pay | Admitting: Cardiovascular Disease

## 2016-01-18 ENCOUNTER — Ambulatory Visit (INDEPENDENT_AMBULATORY_CARE_PROVIDER_SITE_OTHER): Payer: Medicare Other | Admitting: Cardiovascular Disease

## 2016-01-18 ENCOUNTER — Encounter (INDEPENDENT_AMBULATORY_CARE_PROVIDER_SITE_OTHER): Payer: Self-pay

## 2016-01-18 VITALS — BP 124/71 | HR 68 | Ht 60.0 in | Wt 171.4 lb

## 2016-01-18 DIAGNOSIS — R0602 Shortness of breath: Secondary | ICD-10-CM

## 2016-01-18 DIAGNOSIS — I5032 Chronic diastolic (congestive) heart failure: Secondary | ICD-10-CM

## 2016-01-18 DIAGNOSIS — E871 Hypo-osmolality and hyponatremia: Secondary | ICD-10-CM | POA: Diagnosis not present

## 2016-01-18 HISTORY — DX: Chronic diastolic (congestive) heart failure: I50.32

## 2016-01-18 HISTORY — DX: Hypo-osmolality and hyponatremia: E87.1

## 2016-01-18 LAB — BASIC METABOLIC PANEL
BUN: 14 mg/dL (ref 7–25)
CHLORIDE: 98 mmol/L (ref 98–110)
CO2: 25 mmol/L (ref 20–31)
CREATININE: 1 mg/dL — AB (ref 0.60–0.93)
Calcium: 9.6 mg/dL (ref 8.6–10.4)
Glucose, Bld: 102 mg/dL — ABNORMAL HIGH (ref 65–99)
POTASSIUM: 4.6 mmol/L (ref 3.5–5.3)
Sodium: 135 mmol/L (ref 135–146)

## 2016-01-18 NOTE — Progress Notes (Signed)
Cardiology Office Note   Date:  01/18/2016   ID:  Jordan Higgins, DOB [birth year], MRN [id number]  PCP:  Jordan Peace, NP  Cardiologist:   Jordan Latch, MD   Chief Complaint  Patient presents with  . New Patient (Initial Visit)    post hopital; July 4th due to dehydration and weakness. Pt states no other Sx or concerns.     History of Present Illness: Jordan Higgins is a 70 y.o. female with thrombocytosis and JAK-2 mutation who presents for evaluation of generalized weakness Jordan Higgins was seen in the ED at [facility] on 12/2015 with generalized fatigue and diarrhea.  She was found to be dehydrated with sodium 132.  She received IV fluids and felt much better.  EKG revealed sinus bradycardia with a rate of 55 bpm. Troponin was negative but NT Pro BNP was elevated at 1550.  There was no evidence of heart failure on her chest x-ray and she has not noted any lower extremity edema or shortness of breath.  At the time she was being treated for sinusitis.  She was instructed to follow up with cardiology.  Jordan Higgins had an echo 01/2012 that revealed LVEF 55-60% with grade 1 diastolic dysfunction.  Jordan Higgins was recently diagnosed with thrombocytosis and found to have a JAK-2 mutation.  She started on hydroxyurea three weeks ago.  Jordan Higgins has not noted any chest pain or shortness of breath.  She denies lower extremity edema, orthopnea or PND.  In the past she participated in water aerobics at the [facility] 3 times per week. Lately she started undergoing twice per week. Prior to starting the hydroxyurea she had no energy. She has been feeling much better lately. Her only complaint at this time is a feeling of coldness in her right foot at night.  She notes that she has not tolerated statins in the past. She does not remember all other names but knows that she hasn't been able to tolerate atorvastatin or rosuvastatin.    Past Medical History:  Diagnosis Date  . Anxiety   . Chronic diastolic  heart failure ([place]) 01/18/2016  . Chronic lower back pain   . Essential thrombocythemia ([place])   . GERD (gastroesophageal reflux disease)   . Headache(784.0) 01/16/2012   "all day; think its related to TIA"  . High cholesterol   . Hypertension   . Hyponatremia 01/18/2016  . Hypothyroidism   . PONV (postoperative nausea and vomiting)    "violently; only thing that works for her is scopolamine patch "  . TIA (transient ischemic attack) 01/16/2012    Past Surgical History:  Procedure Laterality Date  . BACK SURGERY    . BLADDER SUSPENSION  1990's   "w/rectal suspensiion"  . CHOLECYSTECTOMY  ~ 2010  . INCISION AND DRAINAGE OF WOUND  11/15/2011; early 12/2011   "abscess; S/P back OR"; "staph infection in wound"  . LUMBAR DISC SURGERY  11/06/2011   "shaved down 2 bulging discs"  . PERIPHERALLY INSERTED CENTRAL CATHETER INSERTION  ~ 12/07/2011   right upper arm     Current Outpatient Prescriptions  Medication Sig Dispense Refill  . aspirin 81 MG tablet Take 81 mg by mouth daily.    . hydroxyurea (HYDREA) 500 MG capsule Take 1 capsule by mouth daily.  11  . levothyroxine (SYNTHROID, LEVOTHROID) 88 MCG tablet Take 88 mcg by mouth See admin instructions. Every day EXCEPT on Sunday.    . loratadine (CLARITIN) 10 MG tablet Take 10 mg  by mouth daily as needed for allergies.    Marland Kitchen omeprazole (PRILOSEC) 20 MG capsule Take 40 mg by mouth daily.    . Probiotic Product (PROBIOTIC DAILY PO) Take 1 capsule by mouth daily.    . ranitidine (ZANTAC) 150 MG tablet Take 300 mg by mouth at bedtime.    . valsartan (DIOVAN) 80 MG tablet Take 80 mg by mouth daily.    Marland Kitchen zolpidem (AMBIEN) 5 MG tablet Take 5 mg by mouth at bedtime as needed for sleep.     No current facility-administered medications for this visit.     Allergies:   Review of patient's allergies indicates no known allergies.    Social History:  The patient  reports that she has never smoked. She has never used smokeless tobacco. She reports that  she does not drink alcohol or use drugs.   Family History:  The patient's family history includes Heart attack in her brother.    ROS:  Please see the history of present illness.   Otherwise, review of systems are positive for none.   All other systems are reviewed and negative.    PHYSICAL EXAM: VS:  BP 124/71 (BP Location: Right Arm, Cuff Size: Normal)   Pulse 68   Ht 5' (1.524 m)   Wt 171 lb 6.4 oz (77.7 kg)   BMI 33.47 kg/m  , BMI Body mass index is 33.47 kg/m. GENERAL:  Well appearing HEENT:  Pupils equal round and reactive, fundi not visualized, oral mucosa unremarkable NECK:  No jugular venous distention, waveform within normal limits, carotid upstroke brisk and symmetric, no bruits, no thyromegaly LYMPHATICS:  No cervical adenopathy LUNGS:  Clear to auscultation bilaterally HEART:  RRR.  PMI not displaced or sustained,S1 and S2 within normal limits, no S3, no S4, no clicks, no rubs, no murmurs ABD:  Flat, positive bowel sounds normal in frequency in pitch, no bruits, no rebound, no guarding, no midline pulsatile mass, no hepatomegaly, no splenomegaly EXT:  2 plus pulses throughout, no edema, no cyanosis no clubbing SKIN:  No rashes no nodules NEURO:  Cranial nerves II through XII grossly intact, motor grossly intact throughout PSYCH:  Cognitively intact, oriented to person place and time    EKG:  EKG is ordered today. The ekg ordered today demonstrates sinus rhythm rate 68 bpm.   Recent Labs: No results found for requested labs within last 8760 hours.   12/13/15: WBC 10.9, hemoglobin 14.1, hematocrit 41, platelets 631 Sodium 132, potassium 3.6, BUN 12, granular 1.0 AST 60 ALT 33  Lipid Panel    Component Value Date/Time   CHOL 138 01/17/2012 0403   TRIG 135 01/17/2012 0403   HDL 44 01/17/2012 0403   CHOLHDL 3.1 01/17/2012 0403   VLDL 27 01/17/2012 0403   LDLCALC 67 01/17/2012 0403      Wt Readings from Last 3 Encounters:  01/18/16 171 lb 6.4 oz (77.7 kg)    12/20/15 171 lb 9.6 oz (77.8 kg)      ASSESSMENT AND PLAN:   # Chronic diastolic heart failure:  Jordan Higgins does not have any evidence of heart failure on exam today. However, her BNP was elevated at the outside hospital. We will check a BNP today as well as an echo to ensure that she does not have worsening heart failure. She had grade 1 diastolic dysfunction and her last echo in 2013.  I suspect that it was elevated in the setting of receiving IV fluids.   # Hyponatremia:  Ms.  To reports having intravascular volume depletion and a sodium level of 132. We will repeat a BMP today to ensure that this has improved.  Current medicines are reviewed at length with the patient today.  The patient does not have concerns regarding medicines.  The following changes have been made:  no change  Labs/ tests ordered today include:   Orders Placed This Encounter  Procedures  . Basic metabolic panel  . B Nat Peptide  . EKG 12-Lead  . ECHOCARDIOGRAM COMPLETE     Disposition:   FU with Salina Stanfield C. Oval Linsey, MD, [facility] as needed    This note was written with the assistance of speech recognition software.  Please excuse any transcriptional errors.  Signed, Lorae Roig C. Oval Linsey, MD, [facility]  01/18/2016 12:53 PM    [place]

## 2016-01-18 NOTE — Patient Instructions (Signed)
Medication Instructions:  Your physician recommends that you continue on your current medications as directed. Please refer to the Current Medication list given to you today.  Labwork: BNP/BMET AT [facility] ON THE FIRST FLOOR  Testing/Procedures: Your physician has requested that you have an echocardiogram. Echocardiography is a painless test that uses sound waves to create images of your heart. It provides your doctor with information about the size and shape of your heart and how well your heart's chambers and valves are working. This procedure takes approximately one hour. There are no restrictions for this procedure. [facility] AT[address]  Follow-Up: AS NEEDED   If you need a refill on your cardiac medications before your next appointment, please call your pharmacy.

## 2016-01-19 LAB — BRAIN NATRIURETIC PEPTIDE: Brain Natriuretic Peptide: 75.7 pg/mL (ref <100)

## 2016-01-21 ENCOUNTER — Telehealth: Payer: Self-pay | Admitting: Cardiovascular Disease

## 2016-01-21 NOTE — Telephone Encounter (Signed)
Left message to call back  

## 2016-01-21 NOTE — Telephone Encounter (Signed)
REturn call about lab results

## 2016-01-21 NOTE — Telephone Encounter (Signed)
-----   Message from Skeet Latch, MD sent at 01/21/2016  9:42 AM EDT ----- BNP is normal.  This means it is unlikely she has heart failure now.  It was probably elevated before because she was receiving IV fluids in the hospital.  Sodium levels are normal now.

## 2016-01-22 NOTE — Telephone Encounter (Signed)
Advised patient of lab results  

## 2016-01-28 ENCOUNTER — Other Ambulatory Visit: Payer: Self-pay

## 2016-01-29 ENCOUNTER — Other Ambulatory Visit: Payer: Self-pay

## 2016-01-29 ENCOUNTER — Ambulatory Visit (HOSPITAL_COMMUNITY): Payer: Medicare Other | Attending: Cardiology

## 2016-01-29 DIAGNOSIS — I253 Aneurysm of heart: Secondary | ICD-10-CM | POA: Diagnosis not present

## 2016-01-29 DIAGNOSIS — I509 Heart failure, unspecified: Secondary | ICD-10-CM | POA: Diagnosis not present

## 2016-01-29 DIAGNOSIS — I5189 Other ill-defined heart diseases: Secondary | ICD-10-CM | POA: Insufficient documentation

## 2016-01-29 DIAGNOSIS — R0602 Shortness of breath: Secondary | ICD-10-CM

## 2016-01-29 DIAGNOSIS — I5032 Chronic diastolic (congestive) heart failure: Secondary | ICD-10-CM | POA: Diagnosis not present

## 2016-02-05 DIAGNOSIS — D473 Essential (hemorrhagic) thrombocythemia: Secondary | ICD-10-CM | POA: Diagnosis not present

## 2016-02-06 ENCOUNTER — Telehealth: Payer: Self-pay | Admitting: Cardiovascular Disease

## 2016-02-06 NOTE — Telephone Encounter (Signed)
Spoke to patient. Result given . Verbalized understanding  

## 2016-02-06 NOTE — Telephone Encounter (Signed)
-----   Message from Skeet Latch, MD sent at 01/30/2016  3:32 PM EDT ----- Echo shows that her heart squeezes well but does not relax completely.  This is a mild change and will not cause symptoms unless it worsens.  It will be important to keep her blood pressure under good control.

## 2016-02-06 NOTE — Telephone Encounter (Signed)
New message ° ° ° ° ° °Calling to get echo results °

## 2016-02-07 DIAGNOSIS — E871 Hypo-osmolality and hyponatremia: Secondary | ICD-10-CM | POA: Diagnosis not present

## 2016-02-07 DIAGNOSIS — F419 Anxiety disorder, unspecified: Secondary | ICD-10-CM | POA: Diagnosis not present

## 2016-02-07 DIAGNOSIS — I10 Essential (primary) hypertension: Secondary | ICD-10-CM | POA: Diagnosis not present

## 2016-02-21 DIAGNOSIS — B379 Candidiasis, unspecified: Secondary | ICD-10-CM | POA: Diagnosis not present

## 2016-02-28 DIAGNOSIS — H25813 Combined forms of age-related cataract, bilateral: Secondary | ICD-10-CM | POA: Diagnosis not present

## 2016-02-28 DIAGNOSIS — H04123 Dry eye syndrome of bilateral lacrimal glands: Secondary | ICD-10-CM | POA: Diagnosis not present

## 2016-03-18 DIAGNOSIS — Z23 Encounter for immunization: Secondary | ICD-10-CM | POA: Diagnosis not present

## 2016-03-18 DIAGNOSIS — Z6832 Body mass index (BMI) 32.0-32.9, adult: Secondary | ICD-10-CM | POA: Diagnosis not present

## 2016-03-18 DIAGNOSIS — M545 Low back pain: Secondary | ICD-10-CM | POA: Diagnosis not present

## 2016-03-18 DIAGNOSIS — M25551 Pain in right hip: Secondary | ICD-10-CM | POA: Diagnosis not present

## 2016-03-31 DIAGNOSIS — M5136 Other intervertebral disc degeneration, lumbar region: Secondary | ICD-10-CM | POA: Diagnosis not present

## 2016-03-31 DIAGNOSIS — G8929 Other chronic pain: Secondary | ICD-10-CM | POA: Diagnosis not present

## 2016-03-31 DIAGNOSIS — M545 Low back pain: Secondary | ICD-10-CM | POA: Diagnosis not present

## 2016-04-01 DIAGNOSIS — M6281 Muscle weakness (generalized): Secondary | ICD-10-CM | POA: Diagnosis not present

## 2016-04-01 DIAGNOSIS — M545 Low back pain: Secondary | ICD-10-CM | POA: Diagnosis not present

## 2016-04-01 DIAGNOSIS — R2689 Other abnormalities of gait and mobility: Secondary | ICD-10-CM | POA: Diagnosis not present

## 2016-04-01 DIAGNOSIS — R293 Abnormal posture: Secondary | ICD-10-CM | POA: Diagnosis not present

## 2016-04-01 DIAGNOSIS — M79605 Pain in left leg: Secondary | ICD-10-CM | POA: Diagnosis not present

## 2016-04-01 DIAGNOSIS — M2560 Stiffness of unspecified joint, not elsewhere classified: Secondary | ICD-10-CM | POA: Diagnosis not present

## 2016-04-15 DIAGNOSIS — R2689 Other abnormalities of gait and mobility: Secondary | ICD-10-CM | POA: Diagnosis not present

## 2016-04-15 DIAGNOSIS — M2560 Stiffness of unspecified joint, not elsewhere classified: Secondary | ICD-10-CM | POA: Diagnosis not present

## 2016-04-15 DIAGNOSIS — M6281 Muscle weakness (generalized): Secondary | ICD-10-CM | POA: Diagnosis not present

## 2016-04-15 DIAGNOSIS — R293 Abnormal posture: Secondary | ICD-10-CM | POA: Diagnosis not present

## 2016-04-15 DIAGNOSIS — M545 Low back pain: Secondary | ICD-10-CM | POA: Diagnosis not present

## 2016-04-15 DIAGNOSIS — M79605 Pain in left leg: Secondary | ICD-10-CM | POA: Diagnosis not present

## 2016-04-17 DIAGNOSIS — R293 Abnormal posture: Secondary | ICD-10-CM | POA: Diagnosis not present

## 2016-04-17 DIAGNOSIS — M79605 Pain in left leg: Secondary | ICD-10-CM | POA: Diagnosis not present

## 2016-04-17 DIAGNOSIS — M2560 Stiffness of unspecified joint, not elsewhere classified: Secondary | ICD-10-CM | POA: Diagnosis not present

## 2016-04-17 DIAGNOSIS — R2689 Other abnormalities of gait and mobility: Secondary | ICD-10-CM | POA: Diagnosis not present

## 2016-04-17 DIAGNOSIS — M545 Low back pain: Secondary | ICD-10-CM | POA: Diagnosis not present

## 2016-04-17 DIAGNOSIS — M6281 Muscle weakness (generalized): Secondary | ICD-10-CM | POA: Diagnosis not present

## 2016-04-22 DIAGNOSIS — R531 Weakness: Secondary | ICD-10-CM | POA: Diagnosis not present

## 2016-04-25 DIAGNOSIS — M79605 Pain in left leg: Secondary | ICD-10-CM | POA: Diagnosis not present

## 2016-04-25 DIAGNOSIS — M2560 Stiffness of unspecified joint, not elsewhere classified: Secondary | ICD-10-CM | POA: Diagnosis not present

## 2016-04-25 DIAGNOSIS — R2689 Other abnormalities of gait and mobility: Secondary | ICD-10-CM | POA: Diagnosis not present

## 2016-04-25 DIAGNOSIS — R293 Abnormal posture: Secondary | ICD-10-CM | POA: Diagnosis not present

## 2016-04-25 DIAGNOSIS — M545 Low back pain: Secondary | ICD-10-CM | POA: Diagnosis not present

## 2016-04-25 DIAGNOSIS — M6281 Muscle weakness (generalized): Secondary | ICD-10-CM | POA: Diagnosis not present

## 2016-05-02 DIAGNOSIS — R293 Abnormal posture: Secondary | ICD-10-CM | POA: Diagnosis not present

## 2016-05-02 DIAGNOSIS — M6281 Muscle weakness (generalized): Secondary | ICD-10-CM | POA: Diagnosis not present

## 2016-05-02 DIAGNOSIS — R2689 Other abnormalities of gait and mobility: Secondary | ICD-10-CM | POA: Diagnosis not present

## 2016-05-02 DIAGNOSIS — M2560 Stiffness of unspecified joint, not elsewhere classified: Secondary | ICD-10-CM | POA: Diagnosis not present

## 2016-05-02 DIAGNOSIS — M545 Low back pain: Secondary | ICD-10-CM | POA: Diagnosis not present

## 2016-05-02 DIAGNOSIS — M79605 Pain in left leg: Secondary | ICD-10-CM | POA: Diagnosis not present

## 2016-05-08 DIAGNOSIS — M79605 Pain in left leg: Secondary | ICD-10-CM | POA: Diagnosis not present

## 2016-05-08 DIAGNOSIS — R293 Abnormal posture: Secondary | ICD-10-CM | POA: Diagnosis not present

## 2016-05-08 DIAGNOSIS — M2560 Stiffness of unspecified joint, not elsewhere classified: Secondary | ICD-10-CM | POA: Diagnosis not present

## 2016-05-08 DIAGNOSIS — M545 Low back pain: Secondary | ICD-10-CM | POA: Diagnosis not present

## 2016-05-08 DIAGNOSIS — R2689 Other abnormalities of gait and mobility: Secondary | ICD-10-CM | POA: Diagnosis not present

## 2016-05-08 DIAGNOSIS — M6281 Muscle weakness (generalized): Secondary | ICD-10-CM | POA: Diagnosis not present

## 2016-05-15 DIAGNOSIS — J069 Acute upper respiratory infection, unspecified: Secondary | ICD-10-CM | POA: Diagnosis not present

## 2016-05-19 DIAGNOSIS — Z7982 Long term (current) use of aspirin: Secondary | ICD-10-CM | POA: Diagnosis not present

## 2016-05-19 DIAGNOSIS — D473 Essential (hemorrhagic) thrombocythemia: Secondary | ICD-10-CM | POA: Diagnosis not present

## 2016-05-20 DIAGNOSIS — M6281 Muscle weakness (generalized): Secondary | ICD-10-CM | POA: Diagnosis not present

## 2016-05-20 DIAGNOSIS — M79605 Pain in left leg: Secondary | ICD-10-CM | POA: Diagnosis not present

## 2016-05-20 DIAGNOSIS — M545 Low back pain: Secondary | ICD-10-CM | POA: Diagnosis not present

## 2016-05-20 DIAGNOSIS — R2689 Other abnormalities of gait and mobility: Secondary | ICD-10-CM | POA: Diagnosis not present

## 2016-05-20 DIAGNOSIS — M2560 Stiffness of unspecified joint, not elsewhere classified: Secondary | ICD-10-CM | POA: Diagnosis not present

## 2016-05-20 DIAGNOSIS — R293 Abnormal posture: Secondary | ICD-10-CM | POA: Diagnosis not present

## 2016-05-22 DIAGNOSIS — R2689 Other abnormalities of gait and mobility: Secondary | ICD-10-CM | POA: Diagnosis not present

## 2016-05-22 DIAGNOSIS — R293 Abnormal posture: Secondary | ICD-10-CM | POA: Diagnosis not present

## 2016-05-22 DIAGNOSIS — M79605 Pain in left leg: Secondary | ICD-10-CM | POA: Diagnosis not present

## 2016-05-22 DIAGNOSIS — M2560 Stiffness of unspecified joint, not elsewhere classified: Secondary | ICD-10-CM | POA: Diagnosis not present

## 2016-05-22 DIAGNOSIS — M545 Low back pain: Secondary | ICD-10-CM | POA: Diagnosis not present

## 2016-05-22 DIAGNOSIS — M6281 Muscle weakness (generalized): Secondary | ICD-10-CM | POA: Diagnosis not present

## 2016-06-04 DIAGNOSIS — M545 Low back pain: Secondary | ICD-10-CM | POA: Diagnosis not present

## 2016-06-04 DIAGNOSIS — M79605 Pain in left leg: Secondary | ICD-10-CM | POA: Diagnosis not present

## 2016-06-04 DIAGNOSIS — M2560 Stiffness of unspecified joint, not elsewhere classified: Secondary | ICD-10-CM | POA: Diagnosis not present

## 2016-06-04 DIAGNOSIS — R2689 Other abnormalities of gait and mobility: Secondary | ICD-10-CM | POA: Diagnosis not present

## 2016-06-04 DIAGNOSIS — R293 Abnormal posture: Secondary | ICD-10-CM | POA: Diagnosis not present

## 2016-06-04 DIAGNOSIS — M6281 Muscle weakness (generalized): Secondary | ICD-10-CM | POA: Diagnosis not present

## 2016-06-06 DIAGNOSIS — M79605 Pain in left leg: Secondary | ICD-10-CM | POA: Diagnosis not present

## 2016-06-06 DIAGNOSIS — M545 Low back pain: Secondary | ICD-10-CM | POA: Diagnosis not present

## 2016-06-06 DIAGNOSIS — M2560 Stiffness of unspecified joint, not elsewhere classified: Secondary | ICD-10-CM | POA: Diagnosis not present

## 2016-06-06 DIAGNOSIS — M6281 Muscle weakness (generalized): Secondary | ICD-10-CM | POA: Diagnosis not present

## 2016-06-06 DIAGNOSIS — R2689 Other abnormalities of gait and mobility: Secondary | ICD-10-CM | POA: Diagnosis not present

## 2016-06-06 DIAGNOSIS — R293 Abnormal posture: Secondary | ICD-10-CM | POA: Diagnosis not present

## 2016-06-10 DIAGNOSIS — M545 Low back pain: Secondary | ICD-10-CM | POA: Diagnosis not present

## 2016-06-10 DIAGNOSIS — R2689 Other abnormalities of gait and mobility: Secondary | ICD-10-CM | POA: Diagnosis not present

## 2016-06-10 DIAGNOSIS — M79605 Pain in left leg: Secondary | ICD-10-CM | POA: Diagnosis not present

## 2016-06-10 DIAGNOSIS — M6281 Muscle weakness (generalized): Secondary | ICD-10-CM | POA: Diagnosis not present

## 2016-06-10 DIAGNOSIS — R293 Abnormal posture: Secondary | ICD-10-CM | POA: Diagnosis not present

## 2016-06-10 DIAGNOSIS — M2560 Stiffness of unspecified joint, not elsewhere classified: Secondary | ICD-10-CM | POA: Diagnosis not present

## 2016-06-24 DIAGNOSIS — R2689 Other abnormalities of gait and mobility: Secondary | ICD-10-CM | POA: Diagnosis not present

## 2016-06-24 DIAGNOSIS — M6281 Muscle weakness (generalized): Secondary | ICD-10-CM | POA: Diagnosis not present

## 2016-06-24 DIAGNOSIS — R293 Abnormal posture: Secondary | ICD-10-CM | POA: Diagnosis not present

## 2016-06-24 DIAGNOSIS — M2560 Stiffness of unspecified joint, not elsewhere classified: Secondary | ICD-10-CM | POA: Diagnosis not present

## 2016-06-24 DIAGNOSIS — M79605 Pain in left leg: Secondary | ICD-10-CM | POA: Diagnosis not present

## 2016-06-24 DIAGNOSIS — M545 Low back pain: Secondary | ICD-10-CM | POA: Diagnosis not present

## 2016-06-26 DIAGNOSIS — M6281 Muscle weakness (generalized): Secondary | ICD-10-CM | POA: Diagnosis not present

## 2016-06-26 DIAGNOSIS — R2689 Other abnormalities of gait and mobility: Secondary | ICD-10-CM | POA: Diagnosis not present

## 2016-06-26 DIAGNOSIS — M545 Low back pain: Secondary | ICD-10-CM | POA: Diagnosis not present

## 2016-06-26 DIAGNOSIS — R293 Abnormal posture: Secondary | ICD-10-CM | POA: Diagnosis not present

## 2016-06-26 DIAGNOSIS — M79605 Pain in left leg: Secondary | ICD-10-CM | POA: Diagnosis not present

## 2016-06-26 DIAGNOSIS — M2560 Stiffness of unspecified joint, not elsewhere classified: Secondary | ICD-10-CM | POA: Diagnosis not present

## 2016-07-01 DIAGNOSIS — M545 Low back pain: Secondary | ICD-10-CM | POA: Diagnosis not present

## 2016-07-01 DIAGNOSIS — R2689 Other abnormalities of gait and mobility: Secondary | ICD-10-CM | POA: Diagnosis not present

## 2016-07-01 DIAGNOSIS — M79605 Pain in left leg: Secondary | ICD-10-CM | POA: Diagnosis not present

## 2016-07-01 DIAGNOSIS — M6281 Muscle weakness (generalized): Secondary | ICD-10-CM | POA: Diagnosis not present

## 2016-07-01 DIAGNOSIS — R293 Abnormal posture: Secondary | ICD-10-CM | POA: Diagnosis not present

## 2016-07-01 DIAGNOSIS — M2560 Stiffness of unspecified joint, not elsewhere classified: Secondary | ICD-10-CM | POA: Diagnosis not present

## 2016-07-03 DIAGNOSIS — M2560 Stiffness of unspecified joint, not elsewhere classified: Secondary | ICD-10-CM | POA: Diagnosis not present

## 2016-07-03 DIAGNOSIS — M545 Low back pain: Secondary | ICD-10-CM | POA: Diagnosis not present

## 2016-07-03 DIAGNOSIS — R2689 Other abnormalities of gait and mobility: Secondary | ICD-10-CM | POA: Diagnosis not present

## 2016-07-03 DIAGNOSIS — M6281 Muscle weakness (generalized): Secondary | ICD-10-CM | POA: Diagnosis not present

## 2016-07-03 DIAGNOSIS — M79605 Pain in left leg: Secondary | ICD-10-CM | POA: Diagnosis not present

## 2016-07-03 DIAGNOSIS — R293 Abnormal posture: Secondary | ICD-10-CM | POA: Diagnosis not present

## 2016-07-10 DIAGNOSIS — M6281 Muscle weakness (generalized): Secondary | ICD-10-CM | POA: Diagnosis not present

## 2016-07-10 DIAGNOSIS — R2689 Other abnormalities of gait and mobility: Secondary | ICD-10-CM | POA: Diagnosis not present

## 2016-07-10 DIAGNOSIS — M545 Low back pain: Secondary | ICD-10-CM | POA: Diagnosis not present

## 2016-07-10 DIAGNOSIS — M79605 Pain in left leg: Secondary | ICD-10-CM | POA: Diagnosis not present

## 2016-07-10 DIAGNOSIS — R293 Abnormal posture: Secondary | ICD-10-CM | POA: Diagnosis not present

## 2016-07-10 DIAGNOSIS — M2560 Stiffness of unspecified joint, not elsewhere classified: Secondary | ICD-10-CM | POA: Diagnosis not present

## 2016-07-15 DIAGNOSIS — M79605 Pain in left leg: Secondary | ICD-10-CM | POA: Diagnosis not present

## 2016-07-15 DIAGNOSIS — M545 Low back pain: Secondary | ICD-10-CM | POA: Diagnosis not present

## 2016-07-15 DIAGNOSIS — R293 Abnormal posture: Secondary | ICD-10-CM | POA: Diagnosis not present

## 2016-07-15 DIAGNOSIS — R2689 Other abnormalities of gait and mobility: Secondary | ICD-10-CM | POA: Diagnosis not present

## 2016-07-15 DIAGNOSIS — M6281 Muscle weakness (generalized): Secondary | ICD-10-CM | POA: Diagnosis not present

## 2016-07-15 DIAGNOSIS — M2560 Stiffness of unspecified joint, not elsewhere classified: Secondary | ICD-10-CM | POA: Diagnosis not present

## 2016-07-17 DIAGNOSIS — R293 Abnormal posture: Secondary | ICD-10-CM | POA: Diagnosis not present

## 2016-07-17 DIAGNOSIS — R2689 Other abnormalities of gait and mobility: Secondary | ICD-10-CM | POA: Diagnosis not present

## 2016-07-17 DIAGNOSIS — M2560 Stiffness of unspecified joint, not elsewhere classified: Secondary | ICD-10-CM | POA: Diagnosis not present

## 2016-07-17 DIAGNOSIS — M79605 Pain in left leg: Secondary | ICD-10-CM | POA: Diagnosis not present

## 2016-07-17 DIAGNOSIS — M545 Low back pain: Secondary | ICD-10-CM | POA: Diagnosis not present

## 2016-07-17 DIAGNOSIS — M6281 Muscle weakness (generalized): Secondary | ICD-10-CM | POA: Diagnosis not present

## 2016-07-22 DIAGNOSIS — R293 Abnormal posture: Secondary | ICD-10-CM | POA: Diagnosis not present

## 2016-07-22 DIAGNOSIS — M2560 Stiffness of unspecified joint, not elsewhere classified: Secondary | ICD-10-CM | POA: Diagnosis not present

## 2016-07-22 DIAGNOSIS — M6281 Muscle weakness (generalized): Secondary | ICD-10-CM | POA: Diagnosis not present

## 2016-07-22 DIAGNOSIS — M79605 Pain in left leg: Secondary | ICD-10-CM | POA: Diagnosis not present

## 2016-07-22 DIAGNOSIS — M545 Low back pain: Secondary | ICD-10-CM | POA: Diagnosis not present

## 2016-07-22 DIAGNOSIS — R2689 Other abnormalities of gait and mobility: Secondary | ICD-10-CM | POA: Diagnosis not present

## 2016-07-24 DIAGNOSIS — M6281 Muscle weakness (generalized): Secondary | ICD-10-CM | POA: Diagnosis not present

## 2016-07-24 DIAGNOSIS — R2689 Other abnormalities of gait and mobility: Secondary | ICD-10-CM | POA: Diagnosis not present

## 2016-07-24 DIAGNOSIS — M545 Low back pain: Secondary | ICD-10-CM | POA: Diagnosis not present

## 2016-07-24 DIAGNOSIS — R293 Abnormal posture: Secondary | ICD-10-CM | POA: Diagnosis not present

## 2016-07-24 DIAGNOSIS — M2560 Stiffness of unspecified joint, not elsewhere classified: Secondary | ICD-10-CM | POA: Diagnosis not present

## 2016-07-24 DIAGNOSIS — M79605 Pain in left leg: Secondary | ICD-10-CM | POA: Diagnosis not present

## 2016-07-29 DIAGNOSIS — Z9181 History of falling: Secondary | ICD-10-CM | POA: Diagnosis not present

## 2016-07-29 DIAGNOSIS — E039 Hypothyroidism, unspecified: Secondary | ICD-10-CM | POA: Diagnosis not present

## 2016-07-29 DIAGNOSIS — M549 Dorsalgia, unspecified: Secondary | ICD-10-CM | POA: Diagnosis not present

## 2016-07-29 DIAGNOSIS — E785 Hyperlipidemia, unspecified: Secondary | ICD-10-CM | POA: Diagnosis not present

## 2016-07-29 DIAGNOSIS — F419 Anxiety disorder, unspecified: Secondary | ICD-10-CM | POA: Diagnosis not present

## 2016-07-29 DIAGNOSIS — D473 Essential (hemorrhagic) thrombocythemia: Secondary | ICD-10-CM | POA: Diagnosis not present

## 2016-07-29 DIAGNOSIS — I10 Essential (primary) hypertension: Secondary | ICD-10-CM | POA: Diagnosis not present

## 2016-07-29 DIAGNOSIS — Z1389 Encounter for screening for other disorder: Secondary | ICD-10-CM | POA: Diagnosis not present

## 2016-07-29 DIAGNOSIS — K589 Irritable bowel syndrome without diarrhea: Secondary | ICD-10-CM | POA: Diagnosis not present

## 2016-08-19 DIAGNOSIS — I10 Essential (primary) hypertension: Secondary | ICD-10-CM | POA: Diagnosis not present

## 2016-08-19 DIAGNOSIS — J309 Allergic rhinitis, unspecified: Secondary | ICD-10-CM | POA: Diagnosis not present

## 2016-10-07 DIAGNOSIS — Z1389 Encounter for screening for other disorder: Secondary | ICD-10-CM | POA: Diagnosis not present

## 2016-10-07 DIAGNOSIS — Z6830 Body mass index (BMI) 30.0-30.9, adult: Secondary | ICD-10-CM | POA: Diagnosis not present

## 2016-10-07 DIAGNOSIS — Z0000 Encounter for general adult medical examination without abnormal findings: Secondary | ICD-10-CM | POA: Diagnosis not present

## 2016-10-07 DIAGNOSIS — Z9181 History of falling: Secondary | ICD-10-CM | POA: Diagnosis not present

## 2016-10-07 DIAGNOSIS — E785 Hyperlipidemia, unspecified: Secondary | ICD-10-CM | POA: Diagnosis not present

## 2016-10-07 DIAGNOSIS — Z136 Encounter for screening for cardiovascular disorders: Secondary | ICD-10-CM | POA: Diagnosis not present

## 2016-11-05 DIAGNOSIS — R531 Weakness: Secondary | ICD-10-CM | POA: Diagnosis not present

## 2016-11-05 DIAGNOSIS — I10 Essential (primary) hypertension: Secondary | ICD-10-CM | POA: Diagnosis not present

## 2016-11-05 DIAGNOSIS — E785 Hyperlipidemia, unspecified: Secondary | ICD-10-CM | POA: Diagnosis not present

## 2016-11-05 DIAGNOSIS — G4700 Insomnia, unspecified: Secondary | ICD-10-CM | POA: Diagnosis not present

## 2016-12-15 DIAGNOSIS — D473 Essential (hemorrhagic) thrombocythemia: Secondary | ICD-10-CM | POA: Diagnosis not present

## 2016-12-15 DIAGNOSIS — Z7982 Long term (current) use of aspirin: Secondary | ICD-10-CM | POA: Diagnosis not present

## 2017-01-17 DIAGNOSIS — H1033 Unspecified acute conjunctivitis, bilateral: Secondary | ICD-10-CM | POA: Diagnosis not present

## 2017-02-19 DIAGNOSIS — M545 Low back pain: Secondary | ICD-10-CM | POA: Diagnosis not present

## 2017-02-19 DIAGNOSIS — Z6830 Body mass index (BMI) 30.0-30.9, adult: Secondary | ICD-10-CM | POA: Diagnosis not present

## 2017-03-12 DIAGNOSIS — H04123 Dry eye syndrome of bilateral lacrimal glands: Secondary | ICD-10-CM | POA: Diagnosis not present

## 2017-03-12 DIAGNOSIS — H25813 Combined forms of age-related cataract, bilateral: Secondary | ICD-10-CM | POA: Diagnosis not present

## 2017-03-12 DIAGNOSIS — H35373 Puckering of macula, bilateral: Secondary | ICD-10-CM | POA: Diagnosis not present

## 2017-03-16 DIAGNOSIS — E538 Deficiency of other specified B group vitamins: Secondary | ICD-10-CM | POA: Diagnosis not present

## 2017-03-16 DIAGNOSIS — R531 Weakness: Secondary | ICD-10-CM | POA: Diagnosis not present

## 2017-03-25 DIAGNOSIS — E538 Deficiency of other specified B group vitamins: Secondary | ICD-10-CM | POA: Diagnosis not present

## 2017-03-31 DIAGNOSIS — H02889 Meibomian gland dysfunction of unspecified eye, unspecified eyelid: Secondary | ICD-10-CM | POA: Diagnosis not present

## 2017-03-31 DIAGNOSIS — H2512 Age-related nuclear cataract, left eye: Secondary | ICD-10-CM | POA: Diagnosis not present

## 2017-03-31 DIAGNOSIS — H2511 Age-related nuclear cataract, right eye: Secondary | ICD-10-CM | POA: Diagnosis not present

## 2017-04-01 DIAGNOSIS — E538 Deficiency of other specified B group vitamins: Secondary | ICD-10-CM | POA: Diagnosis not present

## 2017-04-01 DIAGNOSIS — Z23 Encounter for immunization: Secondary | ICD-10-CM | POA: Diagnosis not present

## 2017-04-08 DIAGNOSIS — E538 Deficiency of other specified B group vitamins: Secondary | ICD-10-CM | POA: Diagnosis not present

## 2017-04-16 DIAGNOSIS — H2511 Age-related nuclear cataract, right eye: Secondary | ICD-10-CM | POA: Diagnosis not present

## 2017-04-16 DIAGNOSIS — H25811 Combined forms of age-related cataract, right eye: Secondary | ICD-10-CM | POA: Diagnosis not present

## 2017-05-04 DIAGNOSIS — Z961 Presence of intraocular lens: Secondary | ICD-10-CM | POA: Diagnosis not present

## 2017-05-04 DIAGNOSIS — H25812 Combined forms of age-related cataract, left eye: Secondary | ICD-10-CM | POA: Diagnosis not present

## 2017-05-15 DIAGNOSIS — E538 Deficiency of other specified B group vitamins: Secondary | ICD-10-CM | POA: Diagnosis not present

## 2017-06-18 DIAGNOSIS — D473 Essential (hemorrhagic) thrombocythemia: Secondary | ICD-10-CM | POA: Diagnosis not present

## 2017-06-18 DIAGNOSIS — Z7982 Long term (current) use of aspirin: Secondary | ICD-10-CM | POA: Diagnosis not present

## 2017-06-23 DIAGNOSIS — D509 Iron deficiency anemia, unspecified: Secondary | ICD-10-CM | POA: Diagnosis not present

## 2017-06-30 DIAGNOSIS — D509 Iron deficiency anemia, unspecified: Secondary | ICD-10-CM | POA: Diagnosis not present

## 2017-07-06 DIAGNOSIS — J069 Acute upper respiratory infection, unspecified: Secondary | ICD-10-CM | POA: Diagnosis not present

## 2017-07-06 DIAGNOSIS — R6889 Other general symptoms and signs: Secondary | ICD-10-CM | POA: Diagnosis not present

## 2017-07-10 DIAGNOSIS — J101 Influenza due to other identified influenza virus with other respiratory manifestations: Secondary | ICD-10-CM | POA: Diagnosis not present

## 2017-07-10 DIAGNOSIS — Z6829 Body mass index (BMI) 29.0-29.9, adult: Secondary | ICD-10-CM | POA: Diagnosis not present

## 2017-07-10 DIAGNOSIS — R197 Diarrhea, unspecified: Secondary | ICD-10-CM | POA: Diagnosis not present

## 2017-07-14 DIAGNOSIS — E039 Hypothyroidism, unspecified: Secondary | ICD-10-CM | POA: Diagnosis not present

## 2017-07-14 DIAGNOSIS — R531 Weakness: Secondary | ICD-10-CM | POA: Diagnosis not present

## 2017-07-14 DIAGNOSIS — I10 Essential (primary) hypertension: Secondary | ICD-10-CM | POA: Diagnosis not present

## 2017-07-14 DIAGNOSIS — K219 Gastro-esophageal reflux disease without esophagitis: Secondary | ICD-10-CM | POA: Diagnosis not present

## 2017-07-14 DIAGNOSIS — R404 Transient alteration of awareness: Secondary | ICD-10-CM | POA: Diagnosis not present

## 2017-07-14 DIAGNOSIS — Z7982 Long term (current) use of aspirin: Secondary | ICD-10-CM | POA: Diagnosis not present

## 2017-07-14 DIAGNOSIS — A084 Viral intestinal infection, unspecified: Secondary | ICD-10-CM | POA: Diagnosis not present

## 2017-07-14 DIAGNOSIS — Z79899 Other long term (current) drug therapy: Secondary | ICD-10-CM | POA: Diagnosis not present

## 2017-07-20 DIAGNOSIS — D473 Essential (hemorrhagic) thrombocythemia: Secondary | ICD-10-CM | POA: Diagnosis not present

## 2017-07-20 DIAGNOSIS — I10 Essential (primary) hypertension: Secondary | ICD-10-CM | POA: Diagnosis not present

## 2017-07-20 DIAGNOSIS — R531 Weakness: Secondary | ICD-10-CM | POA: Diagnosis not present

## 2017-07-20 DIAGNOSIS — F419 Anxiety disorder, unspecified: Secondary | ICD-10-CM | POA: Diagnosis not present

## 2017-07-20 DIAGNOSIS — E538 Deficiency of other specified B group vitamins: Secondary | ICD-10-CM | POA: Diagnosis not present

## 2017-07-30 DIAGNOSIS — D473 Essential (hemorrhagic) thrombocythemia: Secondary | ICD-10-CM | POA: Diagnosis not present

## 2017-07-30 DIAGNOSIS — D509 Iron deficiency anemia, unspecified: Secondary | ICD-10-CM | POA: Diagnosis not present

## 2017-08-03 DIAGNOSIS — E538 Deficiency of other specified B group vitamins: Secondary | ICD-10-CM | POA: Diagnosis not present

## 2017-08-03 DIAGNOSIS — D509 Iron deficiency anemia, unspecified: Secondary | ICD-10-CM | POA: Diagnosis not present

## 2017-08-03 DIAGNOSIS — R531 Weakness: Secondary | ICD-10-CM | POA: Diagnosis not present

## 2017-09-04 DIAGNOSIS — E538 Deficiency of other specified B group vitamins: Secondary | ICD-10-CM | POA: Diagnosis not present

## 2017-09-25 DIAGNOSIS — Z6828 Body mass index (BMI) 28.0-28.9, adult: Secondary | ICD-10-CM | POA: Diagnosis not present

## 2017-09-25 DIAGNOSIS — E663 Overweight: Secondary | ICD-10-CM | POA: Diagnosis not present

## 2017-10-05 DIAGNOSIS — E538 Deficiency of other specified B group vitamins: Secondary | ICD-10-CM | POA: Diagnosis not present

## 2017-10-05 DIAGNOSIS — R195 Other fecal abnormalities: Secondary | ICD-10-CM | POA: Diagnosis not present

## 2017-10-13 DIAGNOSIS — Z9181 History of falling: Secondary | ICD-10-CM | POA: Diagnosis not present

## 2017-10-13 DIAGNOSIS — R252 Cramp and spasm: Secondary | ICD-10-CM | POA: Diagnosis not present

## 2017-10-13 DIAGNOSIS — M545 Low back pain: Secondary | ICD-10-CM | POA: Diagnosis not present

## 2017-10-13 DIAGNOSIS — K5790 Diverticulosis of intestine, part unspecified, without perforation or abscess without bleeding: Secondary | ICD-10-CM | POA: Diagnosis not present

## 2017-10-13 DIAGNOSIS — Z1389 Encounter for screening for other disorder: Secondary | ICD-10-CM | POA: Diagnosis not present

## 2017-10-13 DIAGNOSIS — D509 Iron deficiency anemia, unspecified: Secondary | ICD-10-CM | POA: Diagnosis not present

## 2017-10-13 DIAGNOSIS — D473 Essential (hemorrhagic) thrombocythemia: Secondary | ICD-10-CM | POA: Diagnosis not present

## 2017-10-13 DIAGNOSIS — Z79899 Other long term (current) drug therapy: Secondary | ICD-10-CM | POA: Diagnosis not present

## 2017-10-13 DIAGNOSIS — Z1331 Encounter for screening for depression: Secondary | ICD-10-CM | POA: Diagnosis not present

## 2017-10-14 DIAGNOSIS — M25551 Pain in right hip: Secondary | ICD-10-CM | POA: Diagnosis not present

## 2017-10-14 DIAGNOSIS — H8110 Benign paroxysmal vertigo, unspecified ear: Secondary | ICD-10-CM | POA: Diagnosis not present

## 2017-10-14 DIAGNOSIS — M2560 Stiffness of unspecified joint, not elsewhere classified: Secondary | ICD-10-CM | POA: Diagnosis not present

## 2017-10-14 DIAGNOSIS — M25552 Pain in left hip: Secondary | ICD-10-CM | POA: Diagnosis not present

## 2017-10-14 DIAGNOSIS — R293 Abnormal posture: Secondary | ICD-10-CM | POA: Diagnosis not present

## 2017-10-14 DIAGNOSIS — R2689 Other abnormalities of gait and mobility: Secondary | ICD-10-CM | POA: Diagnosis not present

## 2017-10-14 DIAGNOSIS — M5489 Other dorsalgia: Secondary | ICD-10-CM | POA: Diagnosis not present

## 2017-10-14 DIAGNOSIS — M6281 Muscle weakness (generalized): Secondary | ICD-10-CM | POA: Diagnosis not present

## 2017-10-20 DIAGNOSIS — M25551 Pain in right hip: Secondary | ICD-10-CM | POA: Diagnosis not present

## 2017-10-20 DIAGNOSIS — R293 Abnormal posture: Secondary | ICD-10-CM | POA: Diagnosis not present

## 2017-10-20 DIAGNOSIS — R2689 Other abnormalities of gait and mobility: Secondary | ICD-10-CM | POA: Diagnosis not present

## 2017-10-20 DIAGNOSIS — M5489 Other dorsalgia: Secondary | ICD-10-CM | POA: Diagnosis not present

## 2017-10-20 DIAGNOSIS — M25552 Pain in left hip: Secondary | ICD-10-CM | POA: Diagnosis not present

## 2017-10-20 DIAGNOSIS — M6281 Muscle weakness (generalized): Secondary | ICD-10-CM | POA: Diagnosis not present

## 2017-10-21 DIAGNOSIS — E785 Hyperlipidemia, unspecified: Secondary | ICD-10-CM | POA: Diagnosis not present

## 2017-10-21 DIAGNOSIS — Z9181 History of falling: Secondary | ICD-10-CM | POA: Diagnosis not present

## 2017-10-21 DIAGNOSIS — Z0000 Encounter for general adult medical examination without abnormal findings: Secondary | ICD-10-CM | POA: Diagnosis not present

## 2017-10-21 DIAGNOSIS — Z1331 Encounter for screening for depression: Secondary | ICD-10-CM | POA: Diagnosis not present

## 2017-10-21 DIAGNOSIS — Z1231 Encounter for screening mammogram for malignant neoplasm of breast: Secondary | ICD-10-CM | POA: Diagnosis not present

## 2017-10-21 DIAGNOSIS — N959 Unspecified menopausal and perimenopausal disorder: Secondary | ICD-10-CM | POA: Diagnosis not present

## 2017-10-21 DIAGNOSIS — Z136 Encounter for screening for cardiovascular disorders: Secondary | ICD-10-CM | POA: Diagnosis not present

## 2017-10-22 DIAGNOSIS — M25552 Pain in left hip: Secondary | ICD-10-CM | POA: Diagnosis not present

## 2017-10-22 DIAGNOSIS — M25551 Pain in right hip: Secondary | ICD-10-CM | POA: Diagnosis not present

## 2017-10-22 DIAGNOSIS — R2689 Other abnormalities of gait and mobility: Secondary | ICD-10-CM | POA: Diagnosis not present

## 2017-10-22 DIAGNOSIS — R293 Abnormal posture: Secondary | ICD-10-CM | POA: Diagnosis not present

## 2017-10-22 DIAGNOSIS — M6281 Muscle weakness (generalized): Secondary | ICD-10-CM | POA: Diagnosis not present

## 2017-10-22 DIAGNOSIS — M5489 Other dorsalgia: Secondary | ICD-10-CM | POA: Diagnosis not present

## 2017-10-27 DIAGNOSIS — M6281 Muscle weakness (generalized): Secondary | ICD-10-CM | POA: Diagnosis not present

## 2017-10-27 DIAGNOSIS — M25552 Pain in left hip: Secondary | ICD-10-CM | POA: Diagnosis not present

## 2017-10-27 DIAGNOSIS — M5489 Other dorsalgia: Secondary | ICD-10-CM | POA: Diagnosis not present

## 2017-10-27 DIAGNOSIS — M25551 Pain in right hip: Secondary | ICD-10-CM | POA: Diagnosis not present

## 2017-10-27 DIAGNOSIS — R2689 Other abnormalities of gait and mobility: Secondary | ICD-10-CM | POA: Diagnosis not present

## 2017-10-27 DIAGNOSIS — R293 Abnormal posture: Secondary | ICD-10-CM | POA: Diagnosis not present

## 2017-10-29 DIAGNOSIS — M5489 Other dorsalgia: Secondary | ICD-10-CM | POA: Diagnosis not present

## 2017-10-29 DIAGNOSIS — M25551 Pain in right hip: Secondary | ICD-10-CM | POA: Diagnosis not present

## 2017-10-29 DIAGNOSIS — M6281 Muscle weakness (generalized): Secondary | ICD-10-CM | POA: Diagnosis not present

## 2017-10-29 DIAGNOSIS — R293 Abnormal posture: Secondary | ICD-10-CM | POA: Diagnosis not present

## 2017-10-29 DIAGNOSIS — M25552 Pain in left hip: Secondary | ICD-10-CM | POA: Diagnosis not present

## 2017-10-29 DIAGNOSIS — R2689 Other abnormalities of gait and mobility: Secondary | ICD-10-CM | POA: Diagnosis not present

## 2017-11-03 DIAGNOSIS — H8110 Benign paroxysmal vertigo, unspecified ear: Secondary | ICD-10-CM | POA: Diagnosis not present

## 2017-11-03 DIAGNOSIS — M25551 Pain in right hip: Secondary | ICD-10-CM | POA: Diagnosis not present

## 2017-11-03 DIAGNOSIS — R293 Abnormal posture: Secondary | ICD-10-CM | POA: Diagnosis not present

## 2017-11-03 DIAGNOSIS — M6281 Muscle weakness (generalized): Secondary | ICD-10-CM | POA: Diagnosis not present

## 2017-11-03 DIAGNOSIS — R2689 Other abnormalities of gait and mobility: Secondary | ICD-10-CM | POA: Diagnosis not present

## 2017-11-03 DIAGNOSIS — M2560 Stiffness of unspecified joint, not elsewhere classified: Secondary | ICD-10-CM | POA: Diagnosis not present

## 2017-11-03 DIAGNOSIS — M25552 Pain in left hip: Secondary | ICD-10-CM | POA: Diagnosis not present

## 2017-11-03 DIAGNOSIS — M545 Low back pain: Secondary | ICD-10-CM | POA: Diagnosis not present

## 2017-11-06 DIAGNOSIS — M25551 Pain in right hip: Secondary | ICD-10-CM | POA: Diagnosis not present

## 2017-11-06 DIAGNOSIS — M25552 Pain in left hip: Secondary | ICD-10-CM | POA: Diagnosis not present

## 2017-11-06 DIAGNOSIS — M545 Low back pain: Secondary | ICD-10-CM | POA: Diagnosis not present

## 2017-11-06 DIAGNOSIS — M6281 Muscle weakness (generalized): Secondary | ICD-10-CM | POA: Diagnosis not present

## 2017-11-06 DIAGNOSIS — R2689 Other abnormalities of gait and mobility: Secondary | ICD-10-CM | POA: Diagnosis not present

## 2017-11-06 DIAGNOSIS — R293 Abnormal posture: Secondary | ICD-10-CM | POA: Diagnosis not present

## 2017-11-10 DIAGNOSIS — M25551 Pain in right hip: Secondary | ICD-10-CM | POA: Diagnosis not present

## 2017-11-10 DIAGNOSIS — R2689 Other abnormalities of gait and mobility: Secondary | ICD-10-CM | POA: Diagnosis not present

## 2017-11-10 DIAGNOSIS — M6281 Muscle weakness (generalized): Secondary | ICD-10-CM | POA: Diagnosis not present

## 2017-11-10 DIAGNOSIS — R293 Abnormal posture: Secondary | ICD-10-CM | POA: Diagnosis not present

## 2017-11-10 DIAGNOSIS — M545 Low back pain: Secondary | ICD-10-CM | POA: Diagnosis not present

## 2017-11-10 DIAGNOSIS — M25552 Pain in left hip: Secondary | ICD-10-CM | POA: Diagnosis not present

## 2017-11-12 DIAGNOSIS — M25551 Pain in right hip: Secondary | ICD-10-CM | POA: Diagnosis not present

## 2017-11-12 DIAGNOSIS — M25552 Pain in left hip: Secondary | ICD-10-CM | POA: Diagnosis not present

## 2017-11-12 DIAGNOSIS — M545 Low back pain: Secondary | ICD-10-CM | POA: Diagnosis not present

## 2017-11-12 DIAGNOSIS — R2689 Other abnormalities of gait and mobility: Secondary | ICD-10-CM | POA: Diagnosis not present

## 2017-11-12 DIAGNOSIS — R293 Abnormal posture: Secondary | ICD-10-CM | POA: Diagnosis not present

## 2017-11-12 DIAGNOSIS — M6281 Muscle weakness (generalized): Secondary | ICD-10-CM | POA: Diagnosis not present

## 2017-11-18 DIAGNOSIS — M545 Low back pain: Secondary | ICD-10-CM | POA: Diagnosis not present

## 2017-11-18 DIAGNOSIS — M25552 Pain in left hip: Secondary | ICD-10-CM | POA: Diagnosis not present

## 2017-11-18 DIAGNOSIS — M25551 Pain in right hip: Secondary | ICD-10-CM | POA: Diagnosis not present

## 2017-11-18 DIAGNOSIS — R293 Abnormal posture: Secondary | ICD-10-CM | POA: Diagnosis not present

## 2017-11-18 DIAGNOSIS — M6281 Muscle weakness (generalized): Secondary | ICD-10-CM | POA: Diagnosis not present

## 2017-11-18 DIAGNOSIS — R2689 Other abnormalities of gait and mobility: Secondary | ICD-10-CM | POA: Diagnosis not present

## 2017-11-24 DIAGNOSIS — D473 Essential (hemorrhagic) thrombocythemia: Secondary | ICD-10-CM | POA: Diagnosis not present

## 2017-11-24 DIAGNOSIS — D509 Iron deficiency anemia, unspecified: Secondary | ICD-10-CM | POA: Diagnosis not present

## 2017-11-24 DIAGNOSIS — Z79899 Other long term (current) drug therapy: Secondary | ICD-10-CM | POA: Diagnosis not present

## 2017-11-24 DIAGNOSIS — M25552 Pain in left hip: Secondary | ICD-10-CM | POA: Diagnosis not present

## 2017-11-24 DIAGNOSIS — R2689 Other abnormalities of gait and mobility: Secondary | ICD-10-CM | POA: Diagnosis not present

## 2017-11-24 DIAGNOSIS — M545 Low back pain: Secondary | ICD-10-CM | POA: Diagnosis not present

## 2017-11-24 DIAGNOSIS — M25551 Pain in right hip: Secondary | ICD-10-CM | POA: Diagnosis not present

## 2017-11-24 DIAGNOSIS — R293 Abnormal posture: Secondary | ICD-10-CM | POA: Diagnosis not present

## 2017-11-24 DIAGNOSIS — M6281 Muscle weakness (generalized): Secondary | ICD-10-CM | POA: Diagnosis not present

## 2017-12-09 DIAGNOSIS — M545 Low back pain: Secondary | ICD-10-CM | POA: Diagnosis not present

## 2017-12-15 DIAGNOSIS — M545 Low back pain: Secondary | ICD-10-CM | POA: Diagnosis not present

## 2017-12-17 DIAGNOSIS — M545 Low back pain: Secondary | ICD-10-CM | POA: Diagnosis not present

## 2017-12-22 DIAGNOSIS — M545 Low back pain: Secondary | ICD-10-CM | POA: Diagnosis not present

## 2017-12-24 DIAGNOSIS — M545 Low back pain: Secondary | ICD-10-CM | POA: Diagnosis not present

## 2017-12-29 DIAGNOSIS — M545 Low back pain: Secondary | ICD-10-CM | POA: Diagnosis not present

## 2017-12-31 DIAGNOSIS — M545 Low back pain: Secondary | ICD-10-CM | POA: Diagnosis not present

## 2017-12-31 DIAGNOSIS — M2560 Stiffness of unspecified joint, not elsewhere classified: Secondary | ICD-10-CM | POA: Diagnosis not present

## 2017-12-31 DIAGNOSIS — R293 Abnormal posture: Secondary | ICD-10-CM | POA: Diagnosis not present

## 2017-12-31 DIAGNOSIS — M25552 Pain in left hip: Secondary | ICD-10-CM | POA: Diagnosis not present

## 2017-12-31 DIAGNOSIS — M6281 Muscle weakness (generalized): Secondary | ICD-10-CM | POA: Diagnosis not present

## 2017-12-31 DIAGNOSIS — R2689 Other abnormalities of gait and mobility: Secondary | ICD-10-CM | POA: Diagnosis not present

## 2017-12-31 DIAGNOSIS — H8110 Benign paroxysmal vertigo, unspecified ear: Secondary | ICD-10-CM | POA: Diagnosis not present

## 2017-12-31 DIAGNOSIS — M25551 Pain in right hip: Secondary | ICD-10-CM | POA: Diagnosis not present

## 2018-01-05 DIAGNOSIS — M545 Low back pain: Secondary | ICD-10-CM | POA: Diagnosis not present

## 2018-01-05 DIAGNOSIS — M6281 Muscle weakness (generalized): Secondary | ICD-10-CM | POA: Diagnosis not present

## 2018-01-05 DIAGNOSIS — M25551 Pain in right hip: Secondary | ICD-10-CM | POA: Diagnosis not present

## 2018-01-05 DIAGNOSIS — M25552 Pain in left hip: Secondary | ICD-10-CM | POA: Diagnosis not present

## 2018-01-05 DIAGNOSIS — R293 Abnormal posture: Secondary | ICD-10-CM | POA: Diagnosis not present

## 2018-01-05 DIAGNOSIS — R2689 Other abnormalities of gait and mobility: Secondary | ICD-10-CM | POA: Diagnosis not present

## 2018-01-07 DIAGNOSIS — E663 Overweight: Secondary | ICD-10-CM | POA: Diagnosis not present

## 2018-01-07 DIAGNOSIS — E039 Hypothyroidism, unspecified: Secondary | ICD-10-CM | POA: Diagnosis not present

## 2018-01-07 DIAGNOSIS — R0981 Nasal congestion: Secondary | ICD-10-CM | POA: Diagnosis not present

## 2018-01-07 DIAGNOSIS — Z6827 Body mass index (BMI) 27.0-27.9, adult: Secondary | ICD-10-CM | POA: Diagnosis not present

## 2018-01-13 DIAGNOSIS — M25551 Pain in right hip: Secondary | ICD-10-CM | POA: Diagnosis not present

## 2018-01-13 DIAGNOSIS — R2689 Other abnormalities of gait and mobility: Secondary | ICD-10-CM | POA: Diagnosis not present

## 2018-01-13 DIAGNOSIS — M545 Low back pain: Secondary | ICD-10-CM | POA: Diagnosis not present

## 2018-01-13 DIAGNOSIS — R293 Abnormal posture: Secondary | ICD-10-CM | POA: Diagnosis not present

## 2018-01-13 DIAGNOSIS — M25552 Pain in left hip: Secondary | ICD-10-CM | POA: Diagnosis not present

## 2018-01-13 DIAGNOSIS — M6281 Muscle weakness (generalized): Secondary | ICD-10-CM | POA: Diagnosis not present

## 2018-03-25 DIAGNOSIS — G4700 Insomnia, unspecified: Secondary | ICD-10-CM | POA: Diagnosis not present

## 2018-03-25 DIAGNOSIS — Z6827 Body mass index (BMI) 27.0-27.9, adult: Secondary | ICD-10-CM | POA: Diagnosis not present

## 2018-03-25 DIAGNOSIS — M545 Low back pain: Secondary | ICD-10-CM | POA: Diagnosis not present

## 2018-03-25 DIAGNOSIS — E663 Overweight: Secondary | ICD-10-CM | POA: Diagnosis not present

## 2018-03-25 DIAGNOSIS — D473 Essential (hemorrhagic) thrombocythemia: Secondary | ICD-10-CM

## 2018-03-25 DIAGNOSIS — Z79899 Other long term (current) drug therapy: Secondary | ICD-10-CM

## 2018-04-07 DIAGNOSIS — K589 Irritable bowel syndrome without diarrhea: Secondary | ICD-10-CM | POA: Diagnosis not present

## 2018-04-07 DIAGNOSIS — K5730 Diverticulosis of large intestine without perforation or abscess without bleeding: Secondary | ICD-10-CM | POA: Diagnosis not present

## 2018-04-07 DIAGNOSIS — R1013 Epigastric pain: Secondary | ICD-10-CM | POA: Diagnosis not present

## 2018-04-15 DIAGNOSIS — Z23 Encounter for immunization: Secondary | ICD-10-CM | POA: Diagnosis not present

## 2018-04-15 DIAGNOSIS — E785 Hyperlipidemia, unspecified: Secondary | ICD-10-CM | POA: Diagnosis not present

## 2018-04-15 DIAGNOSIS — D509 Iron deficiency anemia, unspecified: Secondary | ICD-10-CM | POA: Diagnosis not present

## 2018-04-15 DIAGNOSIS — E039 Hypothyroidism, unspecified: Secondary | ICD-10-CM | POA: Diagnosis not present

## 2018-04-15 DIAGNOSIS — Z1339 Encounter for screening examination for other mental health and behavioral disorders: Secondary | ICD-10-CM | POA: Diagnosis not present

## 2018-04-15 DIAGNOSIS — I10 Essential (primary) hypertension: Secondary | ICD-10-CM | POA: Diagnosis not present

## 2018-05-19 DIAGNOSIS — K644 Residual hemorrhoidal skin tags: Secondary | ICD-10-CM | POA: Diagnosis not present

## 2018-05-19 DIAGNOSIS — K591 Functional diarrhea: Secondary | ICD-10-CM | POA: Diagnosis not present

## 2018-05-21 DIAGNOSIS — Z7982 Long term (current) use of aspirin: Secondary | ICD-10-CM | POA: Diagnosis not present

## 2018-05-21 DIAGNOSIS — Z79899 Other long term (current) drug therapy: Secondary | ICD-10-CM | POA: Diagnosis not present

## 2018-05-21 DIAGNOSIS — D473 Essential (hemorrhagic) thrombocythemia: Secondary | ICD-10-CM | POA: Diagnosis not present

## 2018-06-11 DIAGNOSIS — Z1231 Encounter for screening mammogram for malignant neoplasm of breast: Secondary | ICD-10-CM | POA: Diagnosis not present

## 2018-07-22 DIAGNOSIS — Z6827 Body mass index (BMI) 27.0-27.9, adult: Secondary | ICD-10-CM | POA: Diagnosis not present

## 2018-07-22 DIAGNOSIS — E785 Hyperlipidemia, unspecified: Secondary | ICD-10-CM | POA: Diagnosis not present

## 2018-07-22 DIAGNOSIS — D473 Essential (hemorrhagic) thrombocythemia: Secondary | ICD-10-CM | POA: Diagnosis not present

## 2018-07-22 DIAGNOSIS — G4700 Insomnia, unspecified: Secondary | ICD-10-CM | POA: Diagnosis not present

## 2018-07-22 DIAGNOSIS — I10 Essential (primary) hypertension: Secondary | ICD-10-CM | POA: Diagnosis not present

## 2018-07-22 DIAGNOSIS — E039 Hypothyroidism, unspecified: Secondary | ICD-10-CM | POA: Diagnosis not present

## 2018-07-22 DIAGNOSIS — E663 Overweight: Secondary | ICD-10-CM | POA: Diagnosis not present

## 2018-09-20 DIAGNOSIS — D473 Essential (hemorrhagic) thrombocythemia: Secondary | ICD-10-CM | POA: Diagnosis not present

## 2018-10-28 DIAGNOSIS — Z9181 History of falling: Secondary | ICD-10-CM | POA: Diagnosis not present

## 2018-10-28 DIAGNOSIS — Z1331 Encounter for screening for depression: Secondary | ICD-10-CM | POA: Diagnosis not present

## 2018-10-28 DIAGNOSIS — N959 Unspecified menopausal and perimenopausal disorder: Secondary | ICD-10-CM | POA: Diagnosis not present

## 2018-10-28 DIAGNOSIS — E785 Hyperlipidemia, unspecified: Secondary | ICD-10-CM | POA: Diagnosis not present

## 2018-10-28 DIAGNOSIS — Z0000 Encounter for general adult medical examination without abnormal findings: Secondary | ICD-10-CM | POA: Diagnosis not present

## 2018-10-28 DIAGNOSIS — Z136 Encounter for screening for cardiovascular disorders: Secondary | ICD-10-CM | POA: Diagnosis not present

## 2018-11-02 DIAGNOSIS — E785 Hyperlipidemia, unspecified: Secondary | ICD-10-CM | POA: Diagnosis not present

## 2018-11-02 DIAGNOSIS — I10 Essential (primary) hypertension: Secondary | ICD-10-CM | POA: Diagnosis not present

## 2018-11-02 DIAGNOSIS — E039 Hypothyroidism, unspecified: Secondary | ICD-10-CM | POA: Diagnosis not present

## 2018-11-02 DIAGNOSIS — D473 Essential (hemorrhagic) thrombocythemia: Secondary | ICD-10-CM | POA: Diagnosis not present

## 2018-11-12 DIAGNOSIS — M545 Low back pain: Secondary | ICD-10-CM | POA: Diagnosis not present

## 2018-11-12 DIAGNOSIS — M259 Joint disorder, unspecified: Secondary | ICD-10-CM | POA: Diagnosis not present

## 2018-11-15 ENCOUNTER — Other Ambulatory Visit: Payer: Self-pay | Admitting: Orthopedic Surgery

## 2018-11-15 DIAGNOSIS — M259 Joint disorder, unspecified: Secondary | ICD-10-CM

## 2018-12-06 ENCOUNTER — Ambulatory Visit
Admission: RE | Admit: 2018-12-06 | Discharge: 2018-12-06 | Disposition: A | Discharge status: Home / Self Care | Payer: Medicare Other | Source: Ambulatory Visit | Attending: Orthopedic Surgery | Admitting: Orthopedic Surgery

## 2018-12-06 ENCOUNTER — Other Ambulatory Visit: Payer: Self-pay

## 2018-12-06 DIAGNOSIS — M259 Joint disorder, unspecified: Secondary | ICD-10-CM

## 2018-12-06 DIAGNOSIS — M533 Sacrococcygeal disorders, not elsewhere classified: Secondary | ICD-10-CM | POA: Diagnosis not present

## 2018-12-20 DIAGNOSIS — M25512 Pain in left shoulder: Secondary | ICD-10-CM | POA: Diagnosis not present

## 2018-12-20 DIAGNOSIS — M9904 Segmental and somatic dysfunction of sacral region: Secondary | ICD-10-CM | POA: Diagnosis not present

## 2018-12-27 DIAGNOSIS — R2681 Unsteadiness on feet: Secondary | ICD-10-CM | POA: Diagnosis not present

## 2018-12-27 DIAGNOSIS — R2689 Other abnormalities of gait and mobility: Secondary | ICD-10-CM | POA: Diagnosis not present

## 2018-12-27 DIAGNOSIS — M9904 Segmental and somatic dysfunction of sacral region: Secondary | ICD-10-CM | POA: Diagnosis not present

## 2018-12-27 DIAGNOSIS — M6281 Muscle weakness (generalized): Secondary | ICD-10-CM | POA: Diagnosis not present

## 2018-12-27 DIAGNOSIS — M2560 Stiffness of unspecified joint, not elsewhere classified: Secondary | ICD-10-CM | POA: Diagnosis not present

## 2019-01-17 DIAGNOSIS — D573 Sickle-cell trait: Secondary | ICD-10-CM | POA: Diagnosis not present

## 2019-01-17 DIAGNOSIS — D509 Iron deficiency anemia, unspecified: Secondary | ICD-10-CM | POA: Diagnosis not present

## 2019-01-17 DIAGNOSIS — R531 Weakness: Secondary | ICD-10-CM | POA: Diagnosis not present

## 2019-01-17 DIAGNOSIS — G4700 Insomnia, unspecified: Secondary | ICD-10-CM | POA: Diagnosis not present

## 2019-01-17 DIAGNOSIS — E538 Deficiency of other specified B group vitamins: Secondary | ICD-10-CM | POA: Diagnosis not present

## 2019-01-17 DIAGNOSIS — F419 Anxiety disorder, unspecified: Secondary | ICD-10-CM | POA: Diagnosis not present

## 2019-01-24 DIAGNOSIS — E538 Deficiency of other specified B group vitamins: Secondary | ICD-10-CM | POA: Diagnosis not present

## 2019-01-31 DIAGNOSIS — D509 Iron deficiency anemia, unspecified: Secondary | ICD-10-CM | POA: Diagnosis not present

## 2019-02-08 DIAGNOSIS — D509 Iron deficiency anemia, unspecified: Secondary | ICD-10-CM | POA: Diagnosis not present

## 2019-02-21 DIAGNOSIS — M9904 Segmental and somatic dysfunction of sacral region: Secondary | ICD-10-CM | POA: Diagnosis not present

## 2019-03-10 DIAGNOSIS — M533 Sacrococcygeal disorders, not elsewhere classified: Secondary | ICD-10-CM | POA: Diagnosis not present

## 2019-03-14 DIAGNOSIS — E538 Deficiency of other specified B group vitamins: Secondary | ICD-10-CM | POA: Diagnosis not present

## 2019-03-28 DIAGNOSIS — F419 Anxiety disorder, unspecified: Secondary | ICD-10-CM | POA: Diagnosis not present

## 2019-03-28 DIAGNOSIS — R531 Weakness: Secondary | ICD-10-CM | POA: Diagnosis not present

## 2019-04-04 DIAGNOSIS — E039 Hypothyroidism, unspecified: Secondary | ICD-10-CM | POA: Diagnosis not present

## 2019-04-04 DIAGNOSIS — K589 Irritable bowel syndrome without diarrhea: Secondary | ICD-10-CM | POA: Diagnosis not present

## 2019-04-04 DIAGNOSIS — E538 Deficiency of other specified B group vitamins: Secondary | ICD-10-CM | POA: Diagnosis not present

## 2019-04-04 DIAGNOSIS — R531 Weakness: Secondary | ICD-10-CM | POA: Diagnosis not present

## 2019-04-04 DIAGNOSIS — Z6828 Body mass index (BMI) 28.0-28.9, adult: Secondary | ICD-10-CM | POA: Diagnosis not present

## 2019-04-04 DIAGNOSIS — Z20828 Contact with and (suspected) exposure to other viral communicable diseases: Secondary | ICD-10-CM | POA: Diagnosis not present

## 2019-04-18 DIAGNOSIS — E538 Deficiency of other specified B group vitamins: Secondary | ICD-10-CM | POA: Diagnosis not present

## 2019-04-18 DIAGNOSIS — D473 Essential (hemorrhagic) thrombocythemia: Secondary | ICD-10-CM | POA: Diagnosis not present

## 2019-04-18 DIAGNOSIS — Z20828 Contact with and (suspected) exposure to other viral communicable diseases: Secondary | ICD-10-CM | POA: Diagnosis not present

## 2019-04-18 DIAGNOSIS — Z23 Encounter for immunization: Secondary | ICD-10-CM | POA: Diagnosis not present

## 2019-04-21 DIAGNOSIS — D473 Essential (hemorrhagic) thrombocythemia: Secondary | ICD-10-CM | POA: Diagnosis not present

## 2019-04-21 DIAGNOSIS — D509 Iron deficiency anemia, unspecified: Secondary | ICD-10-CM | POA: Diagnosis not present

## 2019-04-26 DIAGNOSIS — D509 Iron deficiency anemia, unspecified: Secondary | ICD-10-CM | POA: Diagnosis not present

## 2019-06-15 DIAGNOSIS — M533 Sacrococcygeal disorders, not elsewhere classified: Secondary | ICD-10-CM | POA: Insufficient documentation

## 2019-08-19 DIAGNOSIS — D473 Essential (hemorrhagic) thrombocythemia: Secondary | ICD-10-CM | POA: Diagnosis not present

## 2019-08-19 DIAGNOSIS — D509 Iron deficiency anemia, unspecified: Secondary | ICD-10-CM | POA: Diagnosis not present

## 2019-09-09 DIAGNOSIS — Z6826 Body mass index (BMI) 26.0-26.9, adult: Secondary | ICD-10-CM | POA: Diagnosis not present

## 2019-09-09 DIAGNOSIS — E039 Hypothyroidism, unspecified: Secondary | ICD-10-CM | POA: Diagnosis not present

## 2019-09-09 DIAGNOSIS — D473 Essential (hemorrhagic) thrombocythemia: Secondary | ICD-10-CM | POA: Diagnosis not present

## 2019-09-09 DIAGNOSIS — K589 Irritable bowel syndrome without diarrhea: Secondary | ICD-10-CM | POA: Diagnosis not present

## 2019-09-09 DIAGNOSIS — R531 Weakness: Secondary | ICD-10-CM | POA: Diagnosis not present

## 2019-09-20 DIAGNOSIS — M533 Sacrococcygeal disorders, not elsewhere classified: Secondary | ICD-10-CM | POA: Diagnosis not present

## 2019-09-23 DIAGNOSIS — Z6825 Body mass index (BMI) 25.0-25.9, adult: Secondary | ICD-10-CM | POA: Diagnosis not present

## 2019-09-23 DIAGNOSIS — R531 Weakness: Secondary | ICD-10-CM | POA: Diagnosis not present

## 2019-09-23 DIAGNOSIS — I10 Essential (primary) hypertension: Secondary | ICD-10-CM | POA: Diagnosis not present

## 2019-09-23 DIAGNOSIS — R002 Palpitations: Secondary | ICD-10-CM | POA: Diagnosis not present

## 2019-10-27 DIAGNOSIS — G8929 Other chronic pain: Secondary | ICD-10-CM | POA: Diagnosis not present

## 2019-10-27 DIAGNOSIS — M533 Sacrococcygeal disorders, not elsewhere classified: Secondary | ICD-10-CM | POA: Diagnosis not present

## 2019-10-27 DIAGNOSIS — M545 Low back pain: Secondary | ICD-10-CM | POA: Diagnosis not present

## 2019-10-27 DIAGNOSIS — I10 Essential (primary) hypertension: Secondary | ICD-10-CM | POA: Diagnosis not present

## 2019-12-15 DIAGNOSIS — Z0000 Encounter for general adult medical examination without abnormal findings: Secondary | ICD-10-CM | POA: Diagnosis not present

## 2019-12-15 DIAGNOSIS — Z9181 History of falling: Secondary | ICD-10-CM | POA: Diagnosis not present

## 2019-12-15 DIAGNOSIS — Z139 Encounter for screening, unspecified: Secondary | ICD-10-CM | POA: Diagnosis not present

## 2019-12-15 DIAGNOSIS — E785 Hyperlipidemia, unspecified: Secondary | ICD-10-CM | POA: Diagnosis not present

## 2019-12-15 DIAGNOSIS — Z1331 Encounter for screening for depression: Secondary | ICD-10-CM | POA: Diagnosis not present

## 2019-12-23 DIAGNOSIS — F321 Major depressive disorder, single episode, moderate: Secondary | ICD-10-CM | POA: Diagnosis not present

## 2019-12-23 DIAGNOSIS — R531 Weakness: Secondary | ICD-10-CM | POA: Diagnosis not present

## 2019-12-23 DIAGNOSIS — G4700 Insomnia, unspecified: Secondary | ICD-10-CM | POA: Diagnosis not present

## 2019-12-23 DIAGNOSIS — F419 Anxiety disorder, unspecified: Secondary | ICD-10-CM | POA: Diagnosis not present

## 2019-12-23 DIAGNOSIS — Z6824 Body mass index (BMI) 24.0-24.9, adult: Secondary | ICD-10-CM | POA: Diagnosis not present

## 2019-12-23 DIAGNOSIS — J309 Allergic rhinitis, unspecified: Secondary | ICD-10-CM | POA: Diagnosis not present

## 2019-12-23 DIAGNOSIS — G8929 Other chronic pain: Secondary | ICD-10-CM | POA: Diagnosis not present

## 2019-12-23 DIAGNOSIS — M545 Low back pain: Secondary | ICD-10-CM | POA: Diagnosis not present

## 2020-01-04 DIAGNOSIS — M5136 Other intervertebral disc degeneration, lumbar region: Secondary | ICD-10-CM | POA: Diagnosis not present

## 2020-01-04 DIAGNOSIS — M533 Sacrococcygeal disorders, not elsewhere classified: Secondary | ICD-10-CM | POA: Diagnosis not present

## 2020-01-04 DIAGNOSIS — M545 Low back pain: Secondary | ICD-10-CM | POA: Diagnosis not present

## 2020-01-20 DIAGNOSIS — K589 Irritable bowel syndrome without diarrhea: Secondary | ICD-10-CM | POA: Diagnosis not present

## 2020-01-20 DIAGNOSIS — G4700 Insomnia, unspecified: Secondary | ICD-10-CM | POA: Diagnosis not present

## 2020-01-20 DIAGNOSIS — Z6823 Body mass index (BMI) 23.0-23.9, adult: Secondary | ICD-10-CM | POA: Diagnosis not present

## 2020-01-20 DIAGNOSIS — E039 Hypothyroidism, unspecified: Secondary | ICD-10-CM | POA: Diagnosis not present

## 2020-01-20 DIAGNOSIS — R531 Weakness: Secondary | ICD-10-CM | POA: Diagnosis not present

## 2020-01-20 DIAGNOSIS — E785 Hyperlipidemia, unspecified: Secondary | ICD-10-CM | POA: Diagnosis not present

## 2020-01-20 DIAGNOSIS — I10 Essential (primary) hypertension: Secondary | ICD-10-CM | POA: Diagnosis not present

## 2020-01-20 DIAGNOSIS — D509 Iron deficiency anemia, unspecified: Secondary | ICD-10-CM | POA: Diagnosis not present

## 2020-01-20 DIAGNOSIS — F419 Anxiety disorder, unspecified: Secondary | ICD-10-CM | POA: Diagnosis not present

## 2020-01-20 DIAGNOSIS — D473 Essential (hemorrhagic) thrombocythemia: Secondary | ICD-10-CM | POA: Diagnosis not present

## 2020-01-25 DIAGNOSIS — R531 Weakness: Secondary | ICD-10-CM | POA: Diagnosis not present

## 2020-02-03 DIAGNOSIS — E871 Hypo-osmolality and hyponatremia: Secondary | ICD-10-CM | POA: Diagnosis not present

## 2020-03-02 DIAGNOSIS — Z23 Encounter for immunization: Secondary | ICD-10-CM | POA: Diagnosis not present

## 2020-03-02 DIAGNOSIS — G8929 Other chronic pain: Secondary | ICD-10-CM | POA: Diagnosis not present

## 2020-03-02 DIAGNOSIS — D473 Essential (hemorrhagic) thrombocythemia: Secondary | ICD-10-CM | POA: Diagnosis not present

## 2020-03-02 DIAGNOSIS — M545 Low back pain, unspecified: Secondary | ICD-10-CM | POA: Diagnosis not present

## 2020-03-02 DIAGNOSIS — K589 Irritable bowel syndrome without diarrhea: Secondary | ICD-10-CM | POA: Diagnosis not present

## 2020-03-02 DIAGNOSIS — F419 Anxiety disorder, unspecified: Secondary | ICD-10-CM | POA: Diagnosis not present

## 2020-03-02 DIAGNOSIS — E871 Hypo-osmolality and hyponatremia: Secondary | ICD-10-CM | POA: Diagnosis not present

## 2020-03-02 DIAGNOSIS — I10 Essential (primary) hypertension: Secondary | ICD-10-CM | POA: Diagnosis not present

## 2020-03-02 DIAGNOSIS — R531 Weakness: Secondary | ICD-10-CM | POA: Diagnosis not present

## 2020-03-02 DIAGNOSIS — Z6823 Body mass index (BMI) 23.0-23.9, adult: Secondary | ICD-10-CM | POA: Diagnosis not present

## 2020-03-02 DIAGNOSIS — G4700 Insomnia, unspecified: Secondary | ICD-10-CM | POA: Diagnosis not present

## 2020-03-02 DIAGNOSIS — E039 Hypothyroidism, unspecified: Secondary | ICD-10-CM | POA: Diagnosis not present

## 2020-03-15 DIAGNOSIS — M5136 Other intervertebral disc degeneration, lumbar region: Secondary | ICD-10-CM | POA: Diagnosis not present

## 2020-04-19 ENCOUNTER — Other Ambulatory Visit: Payer: Self-pay | Admitting: Oncology

## 2020-06-04 DIAGNOSIS — F419 Anxiety disorder, unspecified: Secondary | ICD-10-CM | POA: Diagnosis not present

## 2020-06-04 DIAGNOSIS — E871 Hypo-osmolality and hyponatremia: Secondary | ICD-10-CM | POA: Diagnosis not present

## 2020-06-04 DIAGNOSIS — G4700 Insomnia, unspecified: Secondary | ICD-10-CM | POA: Diagnosis not present

## 2020-06-04 DIAGNOSIS — E039 Hypothyroidism, unspecified: Secondary | ICD-10-CM | POA: Diagnosis not present

## 2020-06-04 DIAGNOSIS — R634 Abnormal weight loss: Secondary | ICD-10-CM | POA: Diagnosis not present

## 2020-06-04 DIAGNOSIS — K589 Irritable bowel syndrome without diarrhea: Secondary | ICD-10-CM | POA: Diagnosis not present

## 2020-06-04 DIAGNOSIS — I10 Essential (primary) hypertension: Secondary | ICD-10-CM | POA: Diagnosis not present

## 2020-06-04 DIAGNOSIS — Z6823 Body mass index (BMI) 23.0-23.9, adult: Secondary | ICD-10-CM | POA: Diagnosis not present

## 2020-06-04 DIAGNOSIS — R531 Weakness: Secondary | ICD-10-CM | POA: Diagnosis not present

## 2020-06-04 DIAGNOSIS — E538 Deficiency of other specified B group vitamins: Secondary | ICD-10-CM | POA: Diagnosis not present

## 2020-06-04 DIAGNOSIS — D473 Essential (hemorrhagic) thrombocythemia: Secondary | ICD-10-CM | POA: Diagnosis not present

## 2020-06-22 DIAGNOSIS — Z6823 Body mass index (BMI) 23.0-23.9, adult: Secondary | ICD-10-CM | POA: Diagnosis not present

## 2020-06-22 DIAGNOSIS — R531 Weakness: Secondary | ICD-10-CM | POA: Diagnosis not present

## 2020-06-22 DIAGNOSIS — E538 Deficiency of other specified B group vitamins: Secondary | ICD-10-CM | POA: Diagnosis not present

## 2020-06-28 DIAGNOSIS — M5416 Radiculopathy, lumbar region: Secondary | ICD-10-CM | POA: Diagnosis not present

## 2020-07-27 DIAGNOSIS — Z6823 Body mass index (BMI) 23.0-23.9, adult: Secondary | ICD-10-CM | POA: Diagnosis not present

## 2020-07-27 DIAGNOSIS — E538 Deficiency of other specified B group vitamins: Secondary | ICD-10-CM | POA: Diagnosis not present

## 2020-07-27 DIAGNOSIS — R531 Weakness: Secondary | ICD-10-CM | POA: Diagnosis not present

## 2020-07-27 DIAGNOSIS — M533 Sacrococcygeal disorders, not elsewhere classified: Secondary | ICD-10-CM | POA: Diagnosis not present

## 2020-07-30 IMAGING — CT CT BIOPSY
1 of 5 series · 10 of 32 positions shown, 16 images · non-contrast
Comparison: none

CLINICAL DATA: Right sacroiliac joint pain.

[Series 2: needle -guided injection · axial · 0.81mm/px · z∈[-156,-52]mm · 10 of 64 slices shown, 16 images]
[im 6/64  soft-tissue]
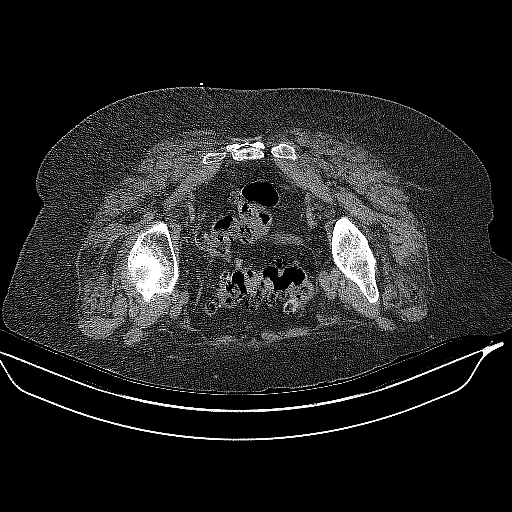
[im 6/64  bone]
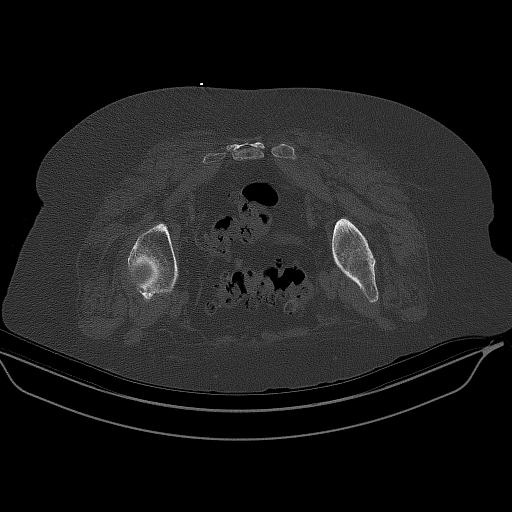
[im 12/64  soft-tissue]
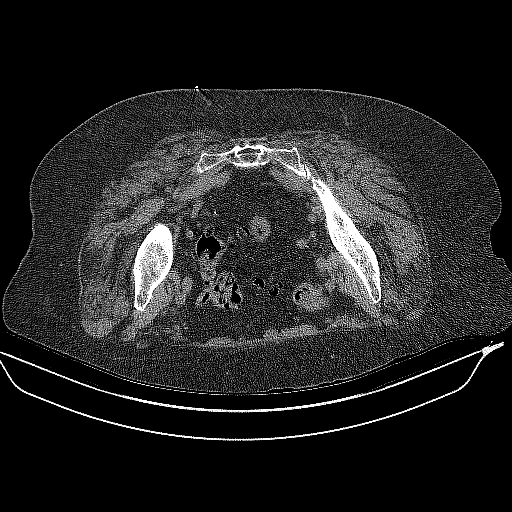
[im 18/64  soft-tissue]
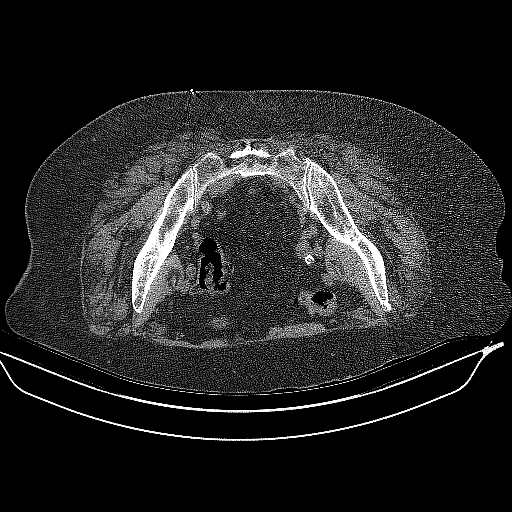
[im 23/64  soft-tissue]
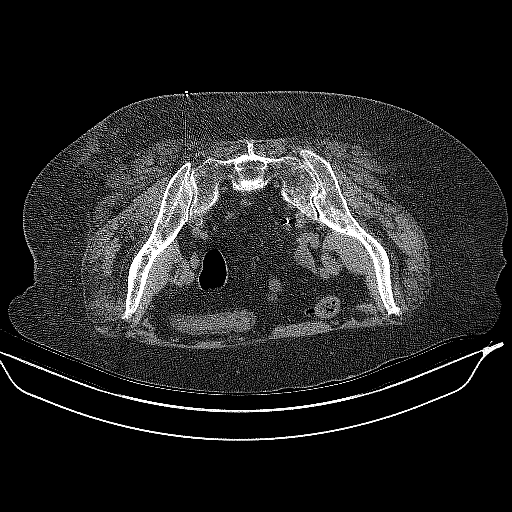
[im 29/64  soft-tissue]
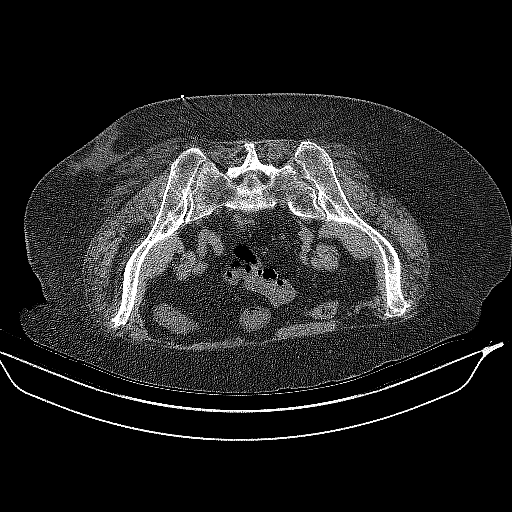
[im 35/64  soft-tissue]
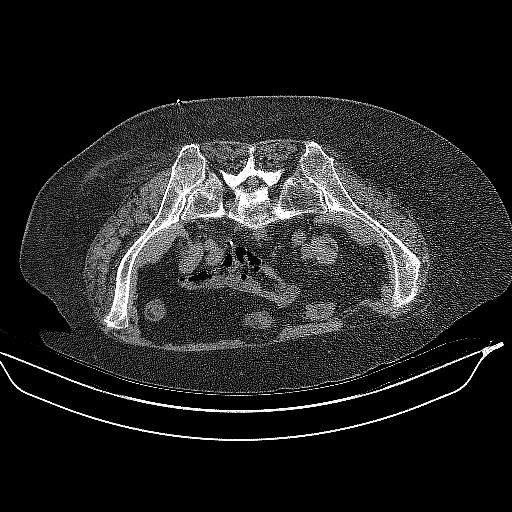
[im 41/64  soft-tissue]
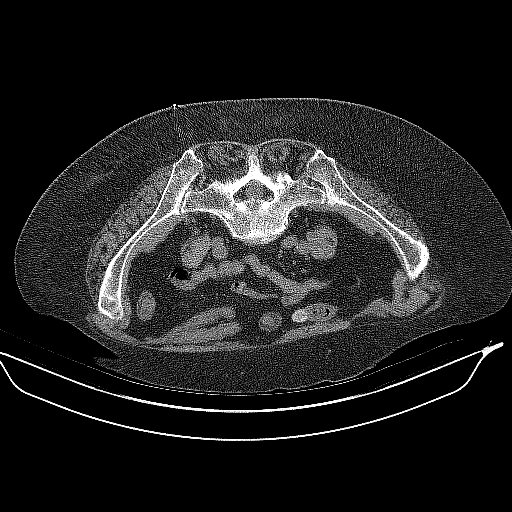
[im 41/64  lung]
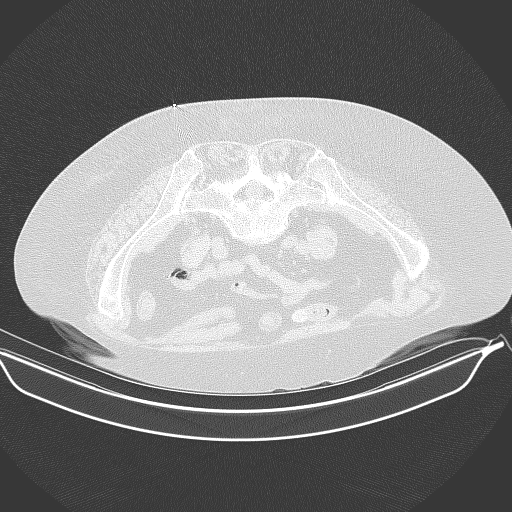
[im 46/64  soft-tissue]
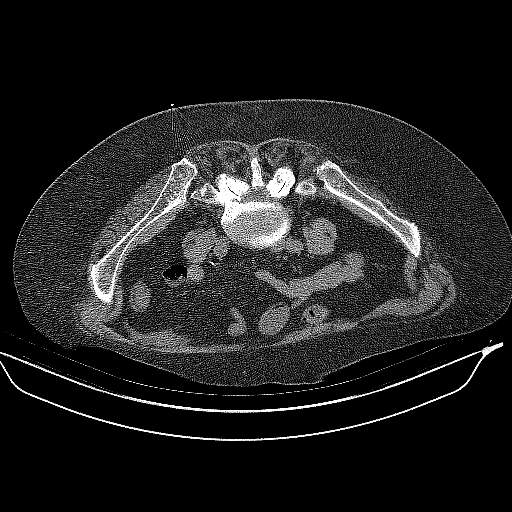
[im 46/64  lung]
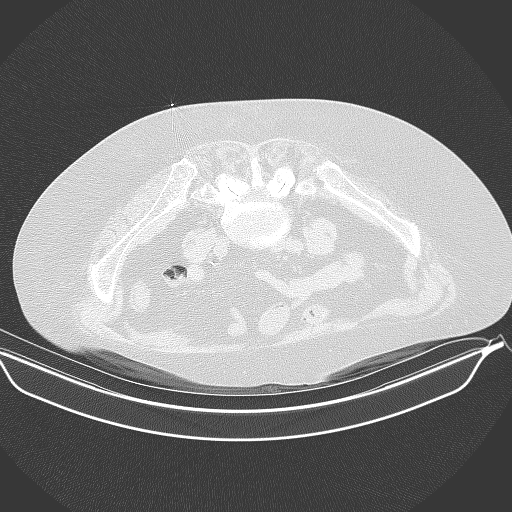
[im 52/64  soft-tissue]
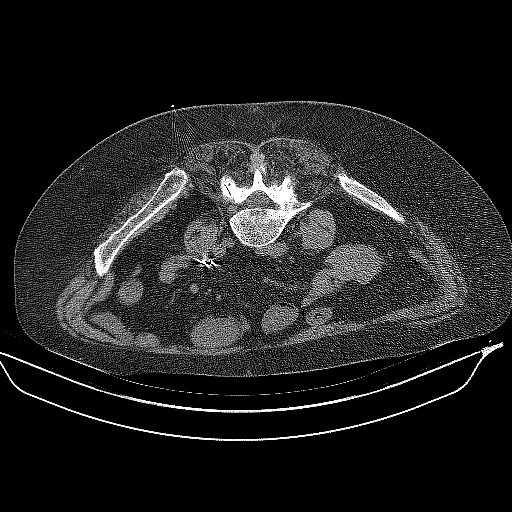
[im 52/64  lung]
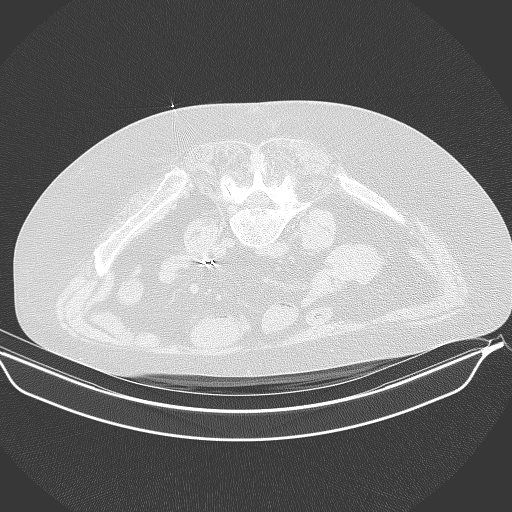
[im 52/64  bone]
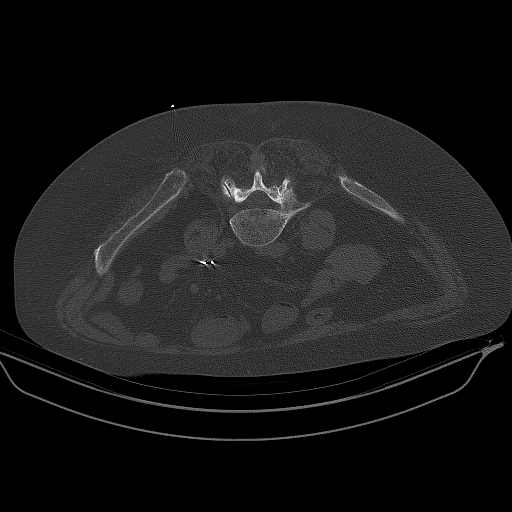
[im 58/64  soft-tissue]
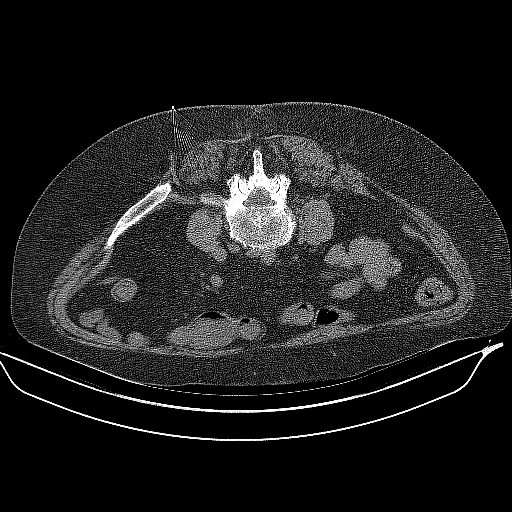
[im 58/64  lung]
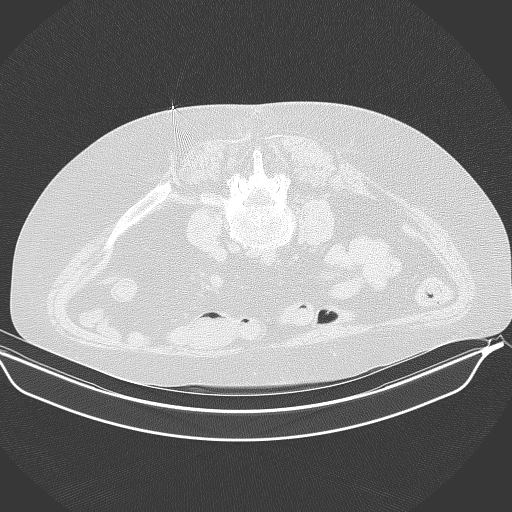

[10 of 32 positions shown; findings below may reference images not displayed]

EXAM:
CT-GUIDED RIGHT SACROILIAC JOINT INJECTION

PROCEDURE:
After a thorough discussion of risks and benefits of the procedure,
including bleeding, infection, injury to nerves, blood vessels, and
adjacent structures, verbal and written consent was obtained. The
patient was placed prone on the CT table and localization was
performed over the sacrum. Target site was marked using CT guidance.
The skin was prepped and draped in the usual sterile fashion using
Betadine soap.

After local anesthesia with 1% lidocaine without epinephrine and
subsequent deep anesthesia, a 3.5 inch 22 gauge spinal needle was
advanced into the right SI joint. Injection of 0.5 ml Isovue-M 200
confirmed intra-articular placement. No vascular uptake present.
Subsequently, 2 mL of 0.5% bupivacaine were injected into the right
SI joint. Needle was removed and a sterile dressing applied.

No complications were observed. The patient was observed and
released under the care of a driver after 30 minutes.
IMPRESSION: Successful CT guided right SI joint injection with anesthetic only.

## 2020-08-16 ENCOUNTER — Telehealth: Payer: Self-pay

## 2020-08-16 NOTE — Telephone Encounter (Addendum)
Dr Bobby Rumpf agreed to let pt come in tomorrow for labs. Langley Gauss gave pt an appt time.     I spoke with pt. She is requesting an appt for tomorrow to see Dr Bobby Rumpf or the PA. She is feeling really weak, and can hardly walk x 2 weeks. She is not driving. Denies SOB. Remains on hydroxyurea 500mg  po qd.

## 2020-08-17 ENCOUNTER — Inpatient Hospital Stay: Payer: Medicare Other | Attending: Oncology

## 2020-08-17 ENCOUNTER — Other Ambulatory Visit: Payer: Self-pay

## 2020-08-17 ENCOUNTER — Other Ambulatory Visit: Payer: Self-pay | Admitting: Hematology and Oncology

## 2020-08-17 DIAGNOSIS — D509 Iron deficiency anemia, unspecified: Secondary | ICD-10-CM

## 2020-08-17 DIAGNOSIS — D649 Anemia, unspecified: Secondary | ICD-10-CM | POA: Diagnosis not present

## 2020-08-17 DIAGNOSIS — D473 Essential (hemorrhagic) thrombocythemia: Secondary | ICD-10-CM | POA: Diagnosis not present

## 2020-08-17 LAB — CBC
MCV: 103 — AB (ref 81–99)
RBC: 3.66 — AB (ref 3.87–5.11)

## 2020-08-17 LAB — CBC AND DIFFERENTIAL
HCT: 38 (ref 36–46)
Hemoglobin: 12.7 (ref 12.0–16.0)
Neutrophils Absolute: 11.35
Platelets: 704 — AB (ref 150–399)
WBC: 13.2

## 2020-08-17 LAB — IRON AND TIBC
Iron: 52 ug/dL (ref 28–170)
Saturation Ratios: 15 % (ref 10.4–31.8)
TIBC: 341 ug/dL (ref 250–450)
UIBC: 289 ug/dL

## 2020-08-17 LAB — FERRITIN: Ferritin: 43 ng/mL (ref 11–307)

## 2020-08-20 ENCOUNTER — Telehealth: Payer: Self-pay

## 2020-08-20 NOTE — Telephone Encounter (Addendum)
Pt notified of Dr Bobby Rumpf' recommendations. She verbalized understanding.    Dr Bobby Rumpf reviewed lab results, states iron little lower, but not enough to recommend Iron infusions. He would like pt to start taking iron 1 tab po BID.     I notified pt that I would discuss results with Dr Bobby Rumpf and call her back.

## 2020-08-21 DIAGNOSIS — M47816 Spondylosis without myelopathy or radiculopathy, lumbar region: Secondary | ICD-10-CM | POA: Diagnosis not present

## 2020-08-30 DIAGNOSIS — M47816 Spondylosis without myelopathy or radiculopathy, lumbar region: Secondary | ICD-10-CM | POA: Diagnosis not present

## 2020-09-06 ENCOUNTER — Other Ambulatory Visit: Payer: Self-pay | Admitting: Oncology

## 2020-09-06 DIAGNOSIS — D473 Essential (hemorrhagic) thrombocythemia: Secondary | ICD-10-CM

## 2020-09-06 NOTE — Progress Notes (Deleted)
  [facility] [facility] [place]  [address] [place],  [place]  [address] [phone]  Clinic Day:  09/06/2020  Referring physician: Lowella Dandy, NP   HISTORY OF PRESENT ILLNESS:  The patient is a 75 y.o. female with essential thrombocythemia.  The patient takes hydroxyurea 500 mg daily with the goal being to keep her platelet count under 600,000.  In November 2020, she was found to be iron deficient, for which she was given IV Feraheme with normalization of her hemoglobin.     PHYSICAL EXAM:  There were no vitals taken for this visit. Wt Readings from Last 3 Encounters:  01/18/16 171 lb 6.4 oz (77.7 kg)  12/20/15 171 lb 9.6 oz (77.8 kg)   There is no height or weight on file to calculate BMI. Performance status (ECOG): {CHL ONC [id number] Physical Exam  LABS:   CBC Latest Ref Rng & Units 08/17/2020 01/16/2012 08/03/2009  WBC - 13.2 10.0 11.0(H)  Hemoglobin 12.0 - 16.0 12.7 10.6(L) 13.0  Hematocrit 36 - 46 38 33.8(L) 38.5  Platelets 150 - 399 704(A) 354 352   CMP Latest Ref Rng & Units 01/18/2016 01/16/2012 08/03/2009  Glucose 65 - 99 mg/dL 102(H) - 138(H)  BUN 7 - 25 mg/dL 14 - 10  Creatinine 0.60 - 0.93 mg/dL 1.00(H) 0.85 0.90  Sodium 135 - 146 mmol/L 135 - 132(L)  Potassium 3.5 - 5.3 mmol/L 4.6 - 4.2  Chloride 98 - 110 mmol/L 98 - 99  CO2 20 - 31 mmol/L 25 - 28  Calcium 8.6 - 10.4 mg/dL 9.6 - 9.5  Total Protein 6.0 - 8.3 g/dL - - 6.9  Total Bilirubin 0.3 - 1.2 mg/dL - - 0.5  Alkaline Phos 39 - 117 U/L - - 84  AST 0 - 37 U/L - - 54(H)  ALT 0 - 35 U/L - - 22     No results found for: CEA1 / No results found for: CEA1 No results found for: PSA1 No results found for: MLY650 No results found for: PTW656  No results found for: TOTALPROTELP, ALBUMINELP, A1GS, A2GS, BETS, BETA2SER, GAMS, MSPIKE, SPEI Lab Results  Component Value Date   TIBC 341 08/17/2020   FERRITIN 43 08/17/2020   IRONPCTSAT 15 08/17/2020   No results found for: LDH  No results  found for: AFPTUMOR, TOTALPROTELP, ALBUMINELP, A1GS, A2GS, BETS, BETA2SER, GAMS, MSPIKE, SPEI, LDH, CEA1, PSA1, IGASERUM, IGGSERUM, IGMSERUM, THGAB, THYROGLB  Recent Review Flowsheet Data    Oncology Labs Latest Ref Rng & Units 08/17/2020   FERRITIN 11 - 307 ng/mL 43   IRONPCTSAT 10.4 - 31.8 % 15       STUDIES:  No results found.    ASSESSMENT & PLAN:   Assessment/Plan:  A 75 y.o. female with essential thrombocythemia.  Her platelet count remains under 600,000 on hydroxyurea 500 mg daily.  Her iron parameters remain normal.  She has mild leukocytosis, which may be secondary to her infection.  We will have her continue hydroxyurea daily.   .The patient understands all the plans discussed today and is in agreement with them.      Devion Chriscoe Macarthur Critchley, MD

## 2020-09-07 ENCOUNTER — Other Ambulatory Visit: Payer: Self-pay

## 2020-09-07 ENCOUNTER — Inpatient Hospital Stay: Payer: Medicare Other

## 2020-09-07 ENCOUNTER — Inpatient Hospital Stay: Payer: Medicare Other | Admitting: Oncology

## 2020-09-07 DIAGNOSIS — Z6822 Body mass index (BMI) 22.0-22.9, adult: Secondary | ICD-10-CM | POA: Diagnosis not present

## 2020-09-07 DIAGNOSIS — R531 Weakness: Secondary | ICD-10-CM | POA: Diagnosis not present

## 2020-09-07 DIAGNOSIS — R1013 Epigastric pain: Secondary | ICD-10-CM | POA: Diagnosis not present

## 2020-09-07 DIAGNOSIS — R262 Difficulty in walking, not elsewhere classified: Secondary | ICD-10-CM | POA: Diagnosis not present

## 2020-09-07 DIAGNOSIS — E538 Deficiency of other specified B group vitamins: Secondary | ICD-10-CM | POA: Diagnosis not present

## 2020-09-13 ENCOUNTER — Other Ambulatory Visit: Payer: Self-pay | Admitting: Oncology

## 2020-09-13 DIAGNOSIS — M47816 Spondylosis without myelopathy or radiculopathy, lumbar region: Secondary | ICD-10-CM | POA: Diagnosis not present

## 2020-09-13 DIAGNOSIS — D509 Iron deficiency anemia, unspecified: Secondary | ICD-10-CM

## 2020-09-13 NOTE — Progress Notes (Signed)
[place]  [address] [place],    [address] [phone]  Clinic Day:  09/14/2020  Referring physician: Lowella Dandy, NP   HISTORY OF PRESENT ILLNESS:  The patient is a 75 y.o. female with essential thrombocythemia.  The patient takes hydroxyurea 500 mg daily with the goal being to keep her platelet count under 600,000.  She comes in today to reassess her platelets.  Since her last visit, the patient has been doing well.  She denies having any underlying clotting complications from her essential thrombocythemia.  Her most pressing health issue is back/right hip pain, for which she is scheduled for an upcoming nerve block.  PHYSICAL EXAM:  Blood pressure 134/66, pulse 81, temperature 98 F (36.7 C), resp. rate 16, height 5' 1.5" (1.562 m), weight 127 lb 8 oz (57.8 kg), SpO2 90 %. Wt Readings from Last 3 Encounters:  09/14/20 127 lb 8 oz (57.8 kg)  01/18/16 171 lb 6.4 oz (77.7 kg)  12/20/15 171 lb 9.6 oz (77.8 kg)   Body mass index is 23.7 kg/m. Performance status (ECOG): 1 Physical Exam Constitutional:      Appearance: Normal appearance. She is not ill-appearing.  HENT:     Mouth/Throat:     Mouth: Mucous membranes are moist.     Pharynx: Oropharynx is clear. No oropharyngeal exudate or posterior oropharyngeal erythema.  Cardiovascular:     Rate and Rhythm: Normal rate and regular rhythm.     Heart sounds: No murmur heard. No friction rub. No gallop.   Pulmonary:     Effort: Pulmonary effort is normal. No respiratory distress.     Breath sounds: Normal breath sounds. No wheezing, rhonchi or rales.  Chest:  Breasts:     Right: No axillary adenopathy or supraclavicular adenopathy.     Left: No axillary adenopathy or supraclavicular adenopathy.    Abdominal:     General: Bowel sounds are normal. There is no distension.     Palpations: Abdomen is soft. There is no mass.     Tenderness: There is no abdominal tenderness.   Musculoskeletal:        General: No swelling.     Right lower leg: No edema.     Left lower leg: No edema.  Lymphadenopathy:     Cervical: No cervical adenopathy.     Upper Body:     Right upper body: No supraclavicular or axillary adenopathy.     Left upper body: No supraclavicular or axillary adenopathy.     Lower Body: No right inguinal adenopathy. No left inguinal adenopathy.  Skin:    General: Skin is warm.     Coloration: Skin is not jaundiced.     Findings: No lesion or rash.  Neurological:     General: No focal deficit present.     Mental Status: She is alert and oriented to person, place, and time. Mental status is at baseline.     Cranial Nerves: Cranial nerves are intact.  Psychiatric:        Mood and Affect: Mood normal.        Behavior: Behavior normal.        Thought Content: Thought content normal.    LABS:   CBC Latest Ref Rng & Units 09/14/2020 08/17/2020 01/16/2012  WBC - 13.4 13.2 10.0  Hemoglobin 12.0 - 16.0 12.0 12.7 10.6(L)  Hematocrit 36 - 46 37 38 33.8(L)  Platelets 150 - 399 758(A) 704(A) 354   CMP Latest Ref Rng &  Units 09/14/2020 01/18/2016 01/16/2012  Glucose 65 - 99 mg/dL - 102(H) -  BUN 4 - 21 18 14  -  Creatinine 0.5 - 1.1 1.0 1.00(H) 0.85  Sodium 137 - 147 128(A) 135 -  Potassium 3.4 - 5.3 4.1 4.6 -  Chloride 99 - 108 95(A) 98 -  CO2 13 - 22 28(A) 25 -  Calcium 8.7 - 10.7 8.7 9.6 -  Total Protein 6.0 - 8.3 g/dL - - -  Total Bilirubin 0.3 - 1.2 mg/dL - - -  Alkaline Phos 25 - 125 72 - -  AST 13 - 35 53(A) - -  ALT 7 - 35 19 - -    ASSESSMENT & PLAN:  Assessment/Plan:  A 75 y.o. female with essential thrombocythemia.  When evaluating her platelets today, her platelets are still above the goal range of 400-600.  Based upon this, I have told the patient to increase her hydroxyurea to 500 mg twice daily on Mondays, Wednesdays, and Fridays.  She knows to continue taking hydroxyurea 500 mg daily for the other 4 days of the week. She knows to  continue taking a baby aspirin on a daily basis to offset any potential clotting complications her essential thrombocythemia can cause.   I will see her back in 2 months to reassess her platelets to see how well they responded to the change in her hydroxyurea dose.  The patient understands all the plans discussed today and is in agreement with them.    Tasneem Cormier Macarthur Critchley, MD

## 2020-09-14 ENCOUNTER — Inpatient Hospital Stay: Payer: Medicare Other | Attending: Oncology | Admitting: Oncology

## 2020-09-14 ENCOUNTER — Other Ambulatory Visit: Payer: Self-pay

## 2020-09-14 ENCOUNTER — Inpatient Hospital Stay: Payer: Medicare Other

## 2020-09-14 ENCOUNTER — Telehealth: Payer: Self-pay | Admitting: Oncology

## 2020-09-14 ENCOUNTER — Other Ambulatory Visit: Payer: Self-pay | Admitting: Hematology and Oncology

## 2020-09-14 ENCOUNTER — Telehealth: Payer: Self-pay

## 2020-09-14 ENCOUNTER — Other Ambulatory Visit: Payer: Self-pay | Admitting: Oncology

## 2020-09-14 DIAGNOSIS — M25551 Pain in right hip: Secondary | ICD-10-CM | POA: Diagnosis not present

## 2020-09-14 DIAGNOSIS — D473 Essential (hemorrhagic) thrombocythemia: Secondary | ICD-10-CM

## 2020-09-14 DIAGNOSIS — Z7982 Long term (current) use of aspirin: Secondary | ICD-10-CM | POA: Diagnosis not present

## 2020-09-14 DIAGNOSIS — D509 Iron deficiency anemia, unspecified: Secondary | ICD-10-CM | POA: Diagnosis not present

## 2020-09-14 DIAGNOSIS — D649 Anemia, unspecified: Secondary | ICD-10-CM | POA: Diagnosis not present

## 2020-09-14 LAB — HEPATIC FUNCTION PANEL
ALT: 19 (ref 7–35)
AST: 53 — AB (ref 13–35)
Alkaline Phosphatase: 72 (ref 25–125)
Bilirubin, Total: 0.3

## 2020-09-14 LAB — COMPREHENSIVE METABOLIC PANEL
Albumin: 3.4 — AB (ref 3.5–5.0)
Calcium: 8.7 (ref 8.7–10.7)

## 2020-09-14 LAB — CBC: RBC: 3.59 — AB (ref 3.87–5.11)

## 2020-09-14 LAB — CBC AND DIFFERENTIAL
HCT: 37 (ref 36–46)
Hemoglobin: 12 (ref 12.0–16.0)
Neutrophils Absolute: 11.39
Platelets: 758 — AB (ref 150–399)
WBC: 13.4

## 2020-09-14 LAB — BASIC METABOLIC PANEL
BUN: 18 (ref 4–21)
CO2: 28 — AB (ref 13–22)
Chloride: 95 — AB (ref 99–108)
Creatinine: 1 (ref 0.5–1.1)
Glucose: 102
Potassium: 4.1 (ref 3.4–5.3)
Sodium: 128 — AB (ref 137–147)

## 2020-09-14 LAB — IRON AND TIBC
Iron: 34 ug/dL (ref 28–170)
Saturation Ratios: 10 % — ABNORMAL LOW (ref 10.4–31.8)
TIBC: 338 ug/dL (ref 250–450)
UIBC: 304 ug/dL

## 2020-09-14 LAB — FERRITIN: Ferritin: 53 ng/mL (ref 11–307)

## 2020-09-14 NOTE — Telephone Encounter (Signed)
Per 4/15 los next appt scheduled and given to patient

## 2020-09-14 NOTE — Addendum Note (Signed)
Addended bySondra Come W on: 09/14/2020 04:00 PM   Modules accepted: Orders

## 2020-09-18 NOTE — Telephone Encounter (Signed)
Updated med list regarding hydrea dosage.

## 2020-09-28 DIAGNOSIS — M47896 Other spondylosis, lumbar region: Secondary | ICD-10-CM | POA: Diagnosis not present

## 2020-09-28 DIAGNOSIS — M47816 Spondylosis without myelopathy or radiculopathy, lumbar region: Secondary | ICD-10-CM | POA: Diagnosis not present

## 2020-10-03 ENCOUNTER — Other Ambulatory Visit (HOSPITAL_COMMUNITY): Payer: Self-pay

## 2020-10-03 MED ORDER — CLORAZEPATE DIPOTASSIUM 3.75 MG PO TABS
3.7500 mg | ORAL_TABLET | Freq: Three times a day (TID) | ORAL | 0 refills | Status: DC | PRN
  Filled 2020-10-03: qty 30, 10d supply, fill #0

## 2020-10-26 DIAGNOSIS — E871 Hypo-osmolality and hyponatremia: Secondary | ICD-10-CM | POA: Diagnosis not present

## 2020-10-26 DIAGNOSIS — G4700 Insomnia, unspecified: Secondary | ICD-10-CM | POA: Diagnosis not present

## 2020-10-26 DIAGNOSIS — K589 Irritable bowel syndrome without diarrhea: Secondary | ICD-10-CM | POA: Diagnosis not present

## 2020-10-26 DIAGNOSIS — D473 Essential (hemorrhagic) thrombocythemia: Secondary | ICD-10-CM | POA: Diagnosis not present

## 2020-10-26 DIAGNOSIS — E039 Hypothyroidism, unspecified: Secondary | ICD-10-CM | POA: Diagnosis not present

## 2020-10-26 DIAGNOSIS — M5136 Other intervertebral disc degeneration, lumbar region: Secondary | ICD-10-CM | POA: Diagnosis not present

## 2020-10-26 DIAGNOSIS — R634 Abnormal weight loss: Secondary | ICD-10-CM | POA: Diagnosis not present

## 2020-10-26 DIAGNOSIS — E538 Deficiency of other specified B group vitamins: Secondary | ICD-10-CM | POA: Diagnosis not present

## 2020-10-26 DIAGNOSIS — R531 Weakness: Secondary | ICD-10-CM | POA: Diagnosis not present

## 2020-10-26 DIAGNOSIS — F419 Anxiety disorder, unspecified: Secondary | ICD-10-CM | POA: Diagnosis not present

## 2020-10-26 DIAGNOSIS — Z6822 Body mass index (BMI) 22.0-22.9, adult: Secondary | ICD-10-CM | POA: Diagnosis not present

## 2020-10-26 DIAGNOSIS — I10 Essential (primary) hypertension: Secondary | ICD-10-CM | POA: Diagnosis not present

## 2020-10-31 DIAGNOSIS — K589 Irritable bowel syndrome without diarrhea: Secondary | ICD-10-CM | POA: Diagnosis not present

## 2020-10-31 DIAGNOSIS — E538 Deficiency of other specified B group vitamins: Secondary | ICD-10-CM | POA: Diagnosis not present

## 2020-10-31 DIAGNOSIS — F419 Anxiety disorder, unspecified: Secondary | ICD-10-CM | POA: Diagnosis not present

## 2020-10-31 DIAGNOSIS — Z79899 Other long term (current) drug therapy: Secondary | ICD-10-CM | POA: Diagnosis not present

## 2020-10-31 DIAGNOSIS — R1013 Epigastric pain: Secondary | ICD-10-CM | POA: Diagnosis not present

## 2020-10-31 DIAGNOSIS — G4700 Insomnia, unspecified: Secondary | ICD-10-CM | POA: Diagnosis not present

## 2020-10-31 DIAGNOSIS — Z6822 Body mass index (BMI) 22.0-22.9, adult: Secondary | ICD-10-CM | POA: Diagnosis not present

## 2020-10-31 DIAGNOSIS — M5136 Other intervertebral disc degeneration, lumbar region: Secondary | ICD-10-CM | POA: Diagnosis not present

## 2020-10-31 DIAGNOSIS — R262 Difficulty in walking, not elsewhere classified: Secondary | ICD-10-CM | POA: Diagnosis not present

## 2020-10-31 DIAGNOSIS — M47896 Other spondylosis, lumbar region: Secondary | ICD-10-CM | POA: Diagnosis not present

## 2020-10-31 DIAGNOSIS — D509 Iron deficiency anemia, unspecified: Secondary | ICD-10-CM | POA: Diagnosis not present

## 2020-10-31 DIAGNOSIS — Z791 Long term (current) use of non-steroidal anti-inflammatories (NSAID): Secondary | ICD-10-CM | POA: Diagnosis not present

## 2020-10-31 DIAGNOSIS — R531 Weakness: Secondary | ICD-10-CM | POA: Diagnosis not present

## 2020-10-31 DIAGNOSIS — I110 Hypertensive heart disease with heart failure: Secondary | ICD-10-CM | POA: Diagnosis not present

## 2020-10-31 DIAGNOSIS — I5032 Chronic diastolic (congestive) heart failure: Secondary | ICD-10-CM | POA: Diagnosis not present

## 2020-10-31 DIAGNOSIS — G8929 Other chronic pain: Secondary | ICD-10-CM | POA: Diagnosis not present

## 2020-10-31 DIAGNOSIS — Z79891 Long term (current) use of opiate analgesic: Secondary | ICD-10-CM | POA: Diagnosis not present

## 2020-10-31 DIAGNOSIS — E039 Hypothyroidism, unspecified: Secondary | ICD-10-CM | POA: Diagnosis not present

## 2020-11-01 DIAGNOSIS — M5136 Other intervertebral disc degeneration, lumbar region: Secondary | ICD-10-CM | POA: Diagnosis not present

## 2020-11-01 DIAGNOSIS — G894 Chronic pain syndrome: Secondary | ICD-10-CM | POA: Diagnosis not present

## 2020-11-06 DIAGNOSIS — E039 Hypothyroidism, unspecified: Secondary | ICD-10-CM | POA: Diagnosis not present

## 2020-11-06 DIAGNOSIS — I5032 Chronic diastolic (congestive) heart failure: Secondary | ICD-10-CM | POA: Diagnosis not present

## 2020-11-06 DIAGNOSIS — F419 Anxiety disorder, unspecified: Secondary | ICD-10-CM | POA: Diagnosis not present

## 2020-11-06 DIAGNOSIS — E538 Deficiency of other specified B group vitamins: Secondary | ICD-10-CM | POA: Diagnosis not present

## 2020-11-06 DIAGNOSIS — M47896 Other spondylosis, lumbar region: Secondary | ICD-10-CM | POA: Diagnosis not present

## 2020-11-06 DIAGNOSIS — I110 Hypertensive heart disease with heart failure: Secondary | ICD-10-CM | POA: Diagnosis not present

## 2020-11-08 DIAGNOSIS — I5032 Chronic diastolic (congestive) heart failure: Secondary | ICD-10-CM | POA: Diagnosis not present

## 2020-11-08 DIAGNOSIS — F419 Anxiety disorder, unspecified: Secondary | ICD-10-CM | POA: Diagnosis not present

## 2020-11-08 DIAGNOSIS — M47896 Other spondylosis, lumbar region: Secondary | ICD-10-CM | POA: Diagnosis not present

## 2020-11-08 DIAGNOSIS — E039 Hypothyroidism, unspecified: Secondary | ICD-10-CM | POA: Diagnosis not present

## 2020-11-08 DIAGNOSIS — I110 Hypertensive heart disease with heart failure: Secondary | ICD-10-CM | POA: Diagnosis not present

## 2020-11-08 DIAGNOSIS — E538 Deficiency of other specified B group vitamins: Secondary | ICD-10-CM | POA: Diagnosis not present

## 2020-11-12 NOTE — Progress Notes (Signed)
[place]  [address] [place],  [place]  [address] [phone]  Clinic Day:  11/15/2020  Referring physician: Lowella Dandy, NP  This document serves as a record of services personally performed by Marice Potter, MD. It was created on their behalf by Curry,Lauren E, a trained medical scribe. The creation of this record is based on the scribe's personal observations and the provider's statements to them.  HISTORY OF PRESENT ILLNESS:  The patient is a 75 y.o. female with essential thrombocythemia.  Due to the rise in her platelets, the patient takes hydroxyurea 500 mg twice daily on Mondays, Wednesdays, and Fridays, and hydroxyurea 500 mg daily for the other 4 days of the week. The goal being is to keep her platelet count under 600,000.  She comes in today to reassess her platelets.  Since her last visit, the patient has been doing okay.  She denies having any underlying clotting complications from her essential thrombocythemia.    PHYSICAL EXAM:  Blood pressure 132/82, pulse (!) 101, temperature 98.4 F (36.9 C), resp. rate 14, height 5' 1.5" (1.562 m), weight 123 lb 14.4 oz (56.2 kg), SpO2 97 %. Wt Readings from Last 3 Encounters:  11/15/20 123 lb 14.4 oz (56.2 kg)  09/14/20 127 lb 8 oz (57.8 kg)  01/18/16 171 lb 6.4 oz (77.7 kg)   Body mass index is 23.03 kg/m. Performance status (ECOG): 1 Physical Exam Constitutional:      Appearance: Normal appearance. She is not ill-appearing.  HENT:     Mouth/Throat:     Mouth: Mucous membranes are moist.     Pharynx: Oropharynx is clear. No oropharyngeal exudate or posterior oropharyngeal erythema.  Cardiovascular:     Rate and Rhythm: Normal rate and regular rhythm.     Heart sounds: No murmur heard.   No friction rub. No gallop.  Pulmonary:     Effort: Pulmonary effort is normal. No respiratory distress.     Breath sounds: Normal breath sounds. No wheezing, rhonchi or rales.  Chest:  Breasts:     Right: No axillary adenopathy or supraclavicular adenopathy.     Left: No axillary adenopathy or supraclavicular adenopathy.  Abdominal:     General: Bowel sounds are normal. There is no distension.     Palpations: Abdomen is soft. There is no mass.     Tenderness: There is no abdominal tenderness.  Musculoskeletal:        General: No swelling.     Right lower leg: No edema.     Left lower leg: No edema.  Lymphadenopathy:     Cervical: No cervical adenopathy.     Upper Body:     Right upper body: No supraclavicular or axillary adenopathy.     Left upper body: No supraclavicular or axillary adenopathy.     Lower Body: No right inguinal adenopathy. No left inguinal adenopathy.  Skin:    General: Skin is warm.     Coloration: Skin is not jaundiced.     Findings: No lesion or rash.  Neurological:     General: No focal deficit present.     Mental Status: She is alert and oriented to person, place, and time. Mental status is at baseline.     Cranial Nerves: Cranial nerves are intact.  Psychiatric:        Mood and Affect: Mood normal.        Behavior: Behavior normal.        Thought Content: Thought  content normal.   LABS:   CBC Latest Ref Rng & Units 11/15/2020 09/14/2020 08/17/2020  WBC - 10.6 13.4 13.2  Hemoglobin 12.0 - 16.0 12.4 12.0 12.7  Hematocrit 36 - 46 37 37 38  Platelets 150 - 399 872(A) 758(A) 704(A)   CMP Latest Ref Rng & Units 09/14/2020 01/18/2016 01/16/2012  Glucose 65 - 99 mg/dL - 102(H) -  BUN 4 - 21 18 14  -  Creatinine 0.5 - 1.1 1.0 1.00(H) 0.85  Sodium 137 - 147 128(A) 135 -  Potassium 3.4 - 5.3 4.1 4.6 -  Chloride 99 - 108 95(A) 98 -  CO2 13 - 22 28(A) 25 -  Calcium 8.7 - 10.7 8.7 9.6 -  Total Protein 6.0 - 8.3 g/dL - - -  Total Bilirubin 0.3 - 1.2 mg/dL - - -  Alkaline Phos 25 - 125 72 - -  AST 13 - 35 53(A) - -  ALT 7 - 35 19 - -    Ref. Range 11/15/2020 13:13  Iron Latest Ref Range: 28 - 170 ug/dL 38  UIBC Latest Units: ug/dL 299  TIBC Latest Ref  Range: 250 - 450 ug/dL 337  Saturation Ratios Latest Ref Range: 10.4 - 31.8 % 11  Ferritin Latest Ref Range: 11 - 307 ng/mL 32    ASSESSMENT & PLAN:  Assessment/Plan:  A 75 y.o. female with essential thrombocythemia.  When evaluating her platelets today, her platelets are well above the goal range of 400-600.  Based upon this the patient knows to increase her hydroxyurea to 500 mg BID for all days.  She knows to continue taking a baby aspirin on a daily basis to offset any potential clotting complications her essential thrombocythemia can cause.   I will see her back in 2 months to reassess her platelets to see how well they responded to the change in her hydroxyurea dose.  The patient understands all the plans discussed today and is in agreement with them.    I, Rita Ohara, am acting as scribe for Marice Potter, MD    I have reviewed this report as typed by the medical scribe, and it is complete and accurate.  Dequincy Macarthur Critchley, MD

## 2020-11-15 ENCOUNTER — Other Ambulatory Visit: Payer: Self-pay | Admitting: Oncology

## 2020-11-15 ENCOUNTER — Inpatient Hospital Stay: Payer: Medicare Other

## 2020-11-15 ENCOUNTER — Encounter: Payer: Self-pay | Admitting: Oncology

## 2020-11-15 ENCOUNTER — Telehealth: Payer: Self-pay | Admitting: Oncology

## 2020-11-15 ENCOUNTER — Inpatient Hospital Stay: Payer: Medicare Other | Attending: Oncology | Admitting: Oncology

## 2020-11-15 ENCOUNTER — Telehealth: Payer: Self-pay

## 2020-11-15 ENCOUNTER — Other Ambulatory Visit: Payer: Self-pay

## 2020-11-15 VITALS — BP 132/82 | HR 101 | Temp 98.4°F | Resp 14 | Ht 61.5 in | Wt 123.9 lb

## 2020-11-15 DIAGNOSIS — D509 Iron deficiency anemia, unspecified: Secondary | ICD-10-CM

## 2020-11-15 DIAGNOSIS — D649 Anemia, unspecified: Secondary | ICD-10-CM | POA: Diagnosis not present

## 2020-11-15 DIAGNOSIS — D473 Essential (hemorrhagic) thrombocythemia: Secondary | ICD-10-CM | POA: Diagnosis not present

## 2020-11-15 LAB — CBC AND DIFFERENTIAL
HCT: 37 (ref 36–46)
Hemoglobin: 12.4 (ref 12.0–16.0)
Neutrophils Absolute: 8.8
Platelets: 872 — AB (ref 150–399)
WBC: 10.6

## 2020-11-15 LAB — CBC: RBC: 3.61 — AB (ref 3.87–5.11)

## 2020-11-15 LAB — IRON AND TIBC
Iron: 38 ug/dL (ref 28–170)
Saturation Ratios: 11 % (ref 10.4–31.8)
TIBC: 337 ug/dL (ref 250–450)
UIBC: 299 ug/dL

## 2020-11-15 LAB — FERRITIN: Ferritin: 32 ng/mL (ref 11–307)

## 2020-11-15 NOTE — Telephone Encounter (Addendum)
11/16/20- Dr Bobby Rumpf asked e to tell pt that her iron levels were normal. He would like for her to increase her hydroxyurea to 500 mg po BID every day.  I called pt and notified her of above. She wrote down the new instructions for the hydroxyurea while talking with me on the phone.     11/15/20 -Pt LVM on nurse triage line that Dr Bobby Rumpf told her to call back this evening to ger her iron results. The results are not ready as of 1410. I told pt I would call her in the am.

## 2020-11-15 NOTE — Telephone Encounter (Signed)
Santiago Glad in Hematology called with critical platelet count: 872. Dr. Bobby Rumpf made aware.

## 2020-11-15 NOTE — Telephone Encounter (Signed)
Per 6/16 LOS, patient scheduled for Sept Appt's.  Gave patient Appt Summary

## 2020-11-20 DIAGNOSIS — D75839 Thrombocytosis, unspecified: Secondary | ICD-10-CM | POA: Diagnosis not present

## 2020-11-20 DIAGNOSIS — K5750 Diverticulosis of both small and large intestine without perforation or abscess without bleeding: Secondary | ICD-10-CM | POA: Diagnosis not present

## 2020-11-20 DIAGNOSIS — E871 Hypo-osmolality and hyponatremia: Secondary | ICD-10-CM | POA: Diagnosis present

## 2020-11-20 DIAGNOSIS — K219 Gastro-esophageal reflux disease without esophagitis: Secondary | ICD-10-CM | POA: Diagnosis present

## 2020-11-20 DIAGNOSIS — R1084 Generalized abdominal pain: Secondary | ICD-10-CM | POA: Diagnosis not present

## 2020-11-20 DIAGNOSIS — Z7982 Long term (current) use of aspirin: Secondary | ICD-10-CM | POA: Diagnosis not present

## 2020-11-20 DIAGNOSIS — I10 Essential (primary) hypertension: Secondary | ICD-10-CM | POA: Diagnosis present

## 2020-11-20 DIAGNOSIS — R21 Rash and other nonspecific skin eruption: Secondary | ICD-10-CM | POA: Diagnosis not present

## 2020-11-20 DIAGNOSIS — I700 Atherosclerosis of aorta: Secondary | ICD-10-CM | POA: Diagnosis not present

## 2020-11-20 DIAGNOSIS — R531 Weakness: Secondary | ICD-10-CM | POA: Diagnosis present

## 2020-11-20 DIAGNOSIS — N281 Cyst of kidney, acquired: Secondary | ICD-10-CM | POA: Diagnosis not present

## 2020-11-20 DIAGNOSIS — R197 Diarrhea, unspecified: Secondary | ICD-10-CM | POA: Diagnosis not present

## 2020-11-20 DIAGNOSIS — E860 Dehydration: Secondary | ICD-10-CM | POA: Diagnosis present

## 2020-11-20 DIAGNOSIS — Z79899 Other long term (current) drug therapy: Secondary | ICD-10-CM | POA: Diagnosis not present

## 2020-11-20 DIAGNOSIS — E876 Hypokalemia: Secondary | ICD-10-CM | POA: Diagnosis present

## 2020-11-20 DIAGNOSIS — E039 Hypothyroidism, unspecified: Secondary | ICD-10-CM | POA: Diagnosis present

## 2020-11-20 DIAGNOSIS — D473 Essential (hemorrhagic) thrombocythemia: Secondary | ICD-10-CM | POA: Diagnosis present

## 2020-11-20 DIAGNOSIS — K529 Noninfective gastroenteritis and colitis, unspecified: Secondary | ICD-10-CM | POA: Diagnosis not present

## 2020-11-20 DIAGNOSIS — K429 Umbilical hernia without obstruction or gangrene: Secondary | ICD-10-CM | POA: Diagnosis not present

## 2020-11-20 DIAGNOSIS — A084 Viral intestinal infection, unspecified: Secondary | ICD-10-CM | POA: Diagnosis present

## 2020-11-20 DIAGNOSIS — R52 Pain, unspecified: Secondary | ICD-10-CM | POA: Diagnosis not present

## 2020-11-20 DIAGNOSIS — K3189 Other diseases of stomach and duodenum: Secondary | ICD-10-CM | POA: Diagnosis not present

## 2020-11-21 DIAGNOSIS — E871 Hypo-osmolality and hyponatremia: Secondary | ICD-10-CM | POA: Diagnosis not present

## 2020-11-21 DIAGNOSIS — A084 Viral intestinal infection, unspecified: Secondary | ICD-10-CM | POA: Diagnosis not present

## 2020-11-21 DIAGNOSIS — E860 Dehydration: Secondary | ICD-10-CM | POA: Diagnosis not present

## 2020-11-23 DIAGNOSIS — E538 Deficiency of other specified B group vitamins: Secondary | ICD-10-CM | POA: Diagnosis not present

## 2020-11-23 DIAGNOSIS — M47896 Other spondylosis, lumbar region: Secondary | ICD-10-CM | POA: Diagnosis not present

## 2020-11-23 DIAGNOSIS — E039 Hypothyroidism, unspecified: Secondary | ICD-10-CM | POA: Diagnosis not present

## 2020-11-23 DIAGNOSIS — F419 Anxiety disorder, unspecified: Secondary | ICD-10-CM | POA: Diagnosis not present

## 2020-11-23 DIAGNOSIS — I110 Hypertensive heart disease with heart failure: Secondary | ICD-10-CM | POA: Diagnosis not present

## 2020-11-23 DIAGNOSIS — I5032 Chronic diastolic (congestive) heart failure: Secondary | ICD-10-CM | POA: Diagnosis not present

## 2020-11-28 DIAGNOSIS — R531 Weakness: Secondary | ICD-10-CM | POA: Diagnosis not present

## 2020-11-28 DIAGNOSIS — K529 Noninfective gastroenteritis and colitis, unspecified: Secondary | ICD-10-CM | POA: Diagnosis not present

## 2020-11-28 DIAGNOSIS — E860 Dehydration: Secondary | ICD-10-CM | POA: Diagnosis not present

## 2020-11-28 DIAGNOSIS — I10 Essential (primary) hypertension: Secondary | ICD-10-CM | POA: Diagnosis not present

## 2020-11-28 DIAGNOSIS — E871 Hypo-osmolality and hyponatremia: Secondary | ICD-10-CM | POA: Diagnosis not present

## 2020-11-29 DIAGNOSIS — I5032 Chronic diastolic (congestive) heart failure: Secondary | ICD-10-CM | POA: Diagnosis not present

## 2020-11-29 DIAGNOSIS — E039 Hypothyroidism, unspecified: Secondary | ICD-10-CM | POA: Diagnosis not present

## 2020-11-29 DIAGNOSIS — F419 Anxiety disorder, unspecified: Secondary | ICD-10-CM | POA: Diagnosis not present

## 2020-11-29 DIAGNOSIS — M47896 Other spondylosis, lumbar region: Secondary | ICD-10-CM | POA: Diagnosis not present

## 2020-11-29 DIAGNOSIS — I110 Hypertensive heart disease with heart failure: Secondary | ICD-10-CM | POA: Diagnosis not present

## 2020-11-29 DIAGNOSIS — E538 Deficiency of other specified B group vitamins: Secondary | ICD-10-CM | POA: Diagnosis not present

## 2020-11-30 DIAGNOSIS — D509 Iron deficiency anemia, unspecified: Secondary | ICD-10-CM | POA: Diagnosis not present

## 2020-11-30 DIAGNOSIS — G4700 Insomnia, unspecified: Secondary | ICD-10-CM | POA: Diagnosis not present

## 2020-11-30 DIAGNOSIS — M5136 Other intervertebral disc degeneration, lumbar region: Secondary | ICD-10-CM | POA: Diagnosis not present

## 2020-11-30 DIAGNOSIS — I5032 Chronic diastolic (congestive) heart failure: Secondary | ICD-10-CM | POA: Diagnosis not present

## 2020-11-30 DIAGNOSIS — R262 Difficulty in walking, not elsewhere classified: Secondary | ICD-10-CM | POA: Diagnosis not present

## 2020-11-30 DIAGNOSIS — E039 Hypothyroidism, unspecified: Secondary | ICD-10-CM | POA: Diagnosis not present

## 2020-11-30 DIAGNOSIS — E538 Deficiency of other specified B group vitamins: Secondary | ICD-10-CM | POA: Diagnosis not present

## 2020-11-30 DIAGNOSIS — K589 Irritable bowel syndrome without diarrhea: Secondary | ICD-10-CM | POA: Diagnosis not present

## 2020-11-30 DIAGNOSIS — G8929 Other chronic pain: Secondary | ICD-10-CM | POA: Diagnosis not present

## 2020-11-30 DIAGNOSIS — R531 Weakness: Secondary | ICD-10-CM | POA: Diagnosis not present

## 2020-11-30 DIAGNOSIS — Z79891 Long term (current) use of opiate analgesic: Secondary | ICD-10-CM | POA: Diagnosis not present

## 2020-11-30 DIAGNOSIS — M47896 Other spondylosis, lumbar region: Secondary | ICD-10-CM | POA: Diagnosis not present

## 2020-11-30 DIAGNOSIS — R1013 Epigastric pain: Secondary | ICD-10-CM | POA: Diagnosis not present

## 2020-11-30 DIAGNOSIS — Z791 Long term (current) use of non-steroidal anti-inflammatories (NSAID): Secondary | ICD-10-CM | POA: Diagnosis not present

## 2020-11-30 DIAGNOSIS — F419 Anxiety disorder, unspecified: Secondary | ICD-10-CM | POA: Diagnosis not present

## 2020-11-30 DIAGNOSIS — I110 Hypertensive heart disease with heart failure: Secondary | ICD-10-CM | POA: Diagnosis not present

## 2020-11-30 DIAGNOSIS — Z6822 Body mass index (BMI) 22.0-22.9, adult: Secondary | ICD-10-CM | POA: Diagnosis not present

## 2020-11-30 DIAGNOSIS — Z79899 Other long term (current) drug therapy: Secondary | ICD-10-CM | POA: Diagnosis not present

## 2020-12-06 ENCOUNTER — Other Ambulatory Visit: Payer: Self-pay | Admitting: Hematology and Oncology

## 2020-12-06 DIAGNOSIS — M47896 Other spondylosis, lumbar region: Secondary | ICD-10-CM | POA: Diagnosis not present

## 2020-12-06 DIAGNOSIS — D473 Essential (hemorrhagic) thrombocythemia: Secondary | ICD-10-CM

## 2020-12-06 DIAGNOSIS — E039 Hypothyroidism, unspecified: Secondary | ICD-10-CM | POA: Diagnosis not present

## 2020-12-06 DIAGNOSIS — I5032 Chronic diastolic (congestive) heart failure: Secondary | ICD-10-CM | POA: Diagnosis not present

## 2020-12-06 DIAGNOSIS — F419 Anxiety disorder, unspecified: Secondary | ICD-10-CM | POA: Diagnosis not present

## 2020-12-06 DIAGNOSIS — E538 Deficiency of other specified B group vitamins: Secondary | ICD-10-CM | POA: Diagnosis not present

## 2020-12-06 DIAGNOSIS — I110 Hypertensive heart disease with heart failure: Secondary | ICD-10-CM | POA: Diagnosis not present

## 2020-12-11 DIAGNOSIS — E039 Hypothyroidism, unspecified: Secondary | ICD-10-CM | POA: Diagnosis not present

## 2020-12-11 DIAGNOSIS — I110 Hypertensive heart disease with heart failure: Secondary | ICD-10-CM | POA: Diagnosis not present

## 2020-12-11 DIAGNOSIS — E538 Deficiency of other specified B group vitamins: Secondary | ICD-10-CM | POA: Diagnosis not present

## 2020-12-11 DIAGNOSIS — M47896 Other spondylosis, lumbar region: Secondary | ICD-10-CM | POA: Diagnosis not present

## 2020-12-11 DIAGNOSIS — I5032 Chronic diastolic (congestive) heart failure: Secondary | ICD-10-CM | POA: Diagnosis not present

## 2020-12-11 DIAGNOSIS — F419 Anxiety disorder, unspecified: Secondary | ICD-10-CM | POA: Diagnosis not present

## 2020-12-19 DIAGNOSIS — D473 Essential (hemorrhagic) thrombocythemia: Secondary | ICD-10-CM | POA: Diagnosis not present

## 2020-12-19 DIAGNOSIS — E782 Mixed hyperlipidemia: Secondary | ICD-10-CM | POA: Diagnosis not present

## 2020-12-19 DIAGNOSIS — I10 Essential (primary) hypertension: Secondary | ICD-10-CM | POA: Diagnosis not present

## 2020-12-19 DIAGNOSIS — R0902 Hypoxemia: Secondary | ICD-10-CM | POA: Diagnosis not present

## 2020-12-19 DIAGNOSIS — I351 Nonrheumatic aortic (valve) insufficiency: Secondary | ICD-10-CM | POA: Diagnosis not present

## 2020-12-19 DIAGNOSIS — G459 Transient cerebral ischemic attack, unspecified: Secondary | ICD-10-CM | POA: Diagnosis not present

## 2020-12-19 DIAGNOSIS — Z0000 Encounter for general adult medical examination without abnormal findings: Secondary | ICD-10-CM | POA: Diagnosis not present

## 2020-12-19 DIAGNOSIS — Z9181 History of falling: Secondary | ICD-10-CM | POA: Diagnosis not present

## 2020-12-19 DIAGNOSIS — I6523 Occlusion and stenosis of bilateral carotid arteries: Secondary | ICD-10-CM | POA: Diagnosis not present

## 2020-12-19 DIAGNOSIS — G319 Degenerative disease of nervous system, unspecified: Secondary | ICD-10-CM | POA: Diagnosis not present

## 2020-12-19 DIAGNOSIS — I491 Atrial premature depolarization: Secondary | ICD-10-CM | POA: Diagnosis not present

## 2020-12-19 DIAGNOSIS — R9082 White matter disease, unspecified: Secondary | ICD-10-CM | POA: Diagnosis not present

## 2020-12-19 DIAGNOSIS — Z139 Encounter for screening, unspecified: Secondary | ICD-10-CM | POA: Diagnosis not present

## 2020-12-19 DIAGNOSIS — E876 Hypokalemia: Secondary | ICD-10-CM | POA: Diagnosis not present

## 2020-12-19 DIAGNOSIS — I639 Cerebral infarction, unspecified: Secondary | ICD-10-CM | POA: Diagnosis not present

## 2020-12-19 DIAGNOSIS — E860 Dehydration: Secondary | ICD-10-CM | POA: Diagnosis not present

## 2020-12-19 DIAGNOSIS — Z8673 Personal history of transient ischemic attack (TIA), and cerebral infarction without residual deficits: Secondary | ICD-10-CM | POA: Diagnosis not present

## 2020-12-19 DIAGNOSIS — R200 Anesthesia of skin: Secondary | ICD-10-CM | POA: Diagnosis not present

## 2020-12-19 DIAGNOSIS — H538 Other visual disturbances: Secondary | ICD-10-CM | POA: Diagnosis not present

## 2020-12-19 DIAGNOSIS — Z1331 Encounter for screening for depression: Secondary | ICD-10-CM | POA: Diagnosis not present

## 2020-12-19 DIAGNOSIS — E785 Hyperlipidemia, unspecified: Secondary | ICD-10-CM | POA: Diagnosis not present

## 2020-12-20 DIAGNOSIS — G319 Degenerative disease of nervous system, unspecified: Secondary | ICD-10-CM | POA: Diagnosis not present

## 2020-12-20 DIAGNOSIS — Z7902 Long term (current) use of antithrombotics/antiplatelets: Secondary | ICD-10-CM | POA: Diagnosis not present

## 2020-12-20 DIAGNOSIS — I6523 Occlusion and stenosis of bilateral carotid arteries: Secondary | ICD-10-CM | POA: Diagnosis not present

## 2020-12-20 DIAGNOSIS — I639 Cerebral infarction, unspecified: Secondary | ICD-10-CM | POA: Diagnosis not present

## 2020-12-20 DIAGNOSIS — Z9282 Status post administration of tPA (rtPA) in a different facility within the last 24 hours prior to admission to current facility: Secondary | ICD-10-CM | POA: Diagnosis not present

## 2020-12-20 DIAGNOSIS — Z9181 History of falling: Secondary | ICD-10-CM | POA: Diagnosis not present

## 2020-12-20 DIAGNOSIS — R29701 NIHSS score 1: Secondary | ICD-10-CM | POA: Diagnosis present

## 2020-12-20 DIAGNOSIS — I6389 Other cerebral infarction: Secondary | ICD-10-CM | POA: Diagnosis not present

## 2020-12-20 DIAGNOSIS — F419 Anxiety disorder, unspecified: Secondary | ICD-10-CM | POA: Diagnosis present

## 2020-12-20 DIAGNOSIS — M545 Low back pain, unspecified: Secondary | ICD-10-CM | POA: Diagnosis present

## 2020-12-20 DIAGNOSIS — R4781 Slurred speech: Secondary | ICD-10-CM | POA: Diagnosis not present

## 2020-12-20 DIAGNOSIS — E871 Hypo-osmolality and hyponatremia: Secondary | ICD-10-CM | POA: Diagnosis present

## 2020-12-20 DIAGNOSIS — Z8719 Personal history of other diseases of the digestive system: Secondary | ICD-10-CM | POA: Diagnosis not present

## 2020-12-20 DIAGNOSIS — R29700 NIHSS score 0: Secondary | ICD-10-CM | POA: Diagnosis present

## 2020-12-20 DIAGNOSIS — R29818 Other symptoms and signs involving the nervous system: Secondary | ICD-10-CM | POA: Diagnosis not present

## 2020-12-20 DIAGNOSIS — E039 Hypothyroidism, unspecified: Secondary | ICD-10-CM | POA: Diagnosis present

## 2020-12-20 DIAGNOSIS — Z79891 Long term (current) use of opiate analgesic: Secondary | ICD-10-CM | POA: Diagnosis not present

## 2020-12-20 DIAGNOSIS — I10 Essential (primary) hypertension: Secondary | ICD-10-CM | POA: Diagnosis present

## 2020-12-20 DIAGNOSIS — Z79899 Other long term (current) drug therapy: Secondary | ICD-10-CM | POA: Diagnosis not present

## 2020-12-20 DIAGNOSIS — I6782 Cerebral ischemia: Secondary | ICD-10-CM | POA: Diagnosis not present

## 2020-12-20 DIAGNOSIS — D473 Essential (hemorrhagic) thrombocythemia: Secondary | ICD-10-CM | POA: Diagnosis present

## 2020-12-20 DIAGNOSIS — Z7982 Long term (current) use of aspirin: Secondary | ICD-10-CM | POA: Diagnosis not present

## 2020-12-20 HISTORY — DX: Cerebral infarction, unspecified: I63.9

## 2020-12-24 ENCOUNTER — Telehealth: Payer: Self-pay | Admitting: Cardiology

## 2020-12-24 DIAGNOSIS — I5032 Chronic diastolic (congestive) heart failure: Secondary | ICD-10-CM | POA: Diagnosis not present

## 2020-12-24 DIAGNOSIS — M47896 Other spondylosis, lumbar region: Secondary | ICD-10-CM | POA: Diagnosis not present

## 2020-12-24 DIAGNOSIS — E538 Deficiency of other specified B group vitamins: Secondary | ICD-10-CM | POA: Diagnosis not present

## 2020-12-24 DIAGNOSIS — I110 Hypertensive heart disease with heart failure: Secondary | ICD-10-CM | POA: Diagnosis not present

## 2020-12-24 DIAGNOSIS — F419 Anxiety disorder, unspecified: Secondary | ICD-10-CM | POA: Diagnosis not present

## 2020-12-24 DIAGNOSIS — E039 Hypothyroidism, unspecified: Secondary | ICD-10-CM | POA: Diagnosis not present

## 2020-12-24 NOTE — Telephone Encounter (Signed)
Patient's daughter is following up.

## 2020-12-24 NOTE — Telephone Encounter (Signed)
Patient daughter is calling in to setup an esophageal ultrasound to see if there is any blood clot at the back of her heart. She stated that Dr. Agustin Cree saw her in the hospital and said that she needs to have done, but the machine in the hospital wasn't working.Dr. Raliegh Ip told daughter to call and have it done asap. I dont see an order in here for it. Please advise.

## 2020-12-25 DIAGNOSIS — E039 Hypothyroidism, unspecified: Secondary | ICD-10-CM | POA: Diagnosis not present

## 2020-12-25 DIAGNOSIS — R112 Nausea with vomiting, unspecified: Secondary | ICD-10-CM | POA: Insufficient documentation

## 2020-12-25 DIAGNOSIS — I110 Hypertensive heart disease with heart failure: Secondary | ICD-10-CM | POA: Diagnosis not present

## 2020-12-25 DIAGNOSIS — I5032 Chronic diastolic (congestive) heart failure: Secondary | ICD-10-CM | POA: Diagnosis not present

## 2020-12-25 DIAGNOSIS — E7800 Pure hypercholesterolemia, unspecified: Secondary | ICD-10-CM | POA: Insufficient documentation

## 2020-12-25 DIAGNOSIS — F419 Anxiety disorder, unspecified: Secondary | ICD-10-CM | POA: Diagnosis not present

## 2020-12-25 DIAGNOSIS — E538 Deficiency of other specified B group vitamins: Secondary | ICD-10-CM | POA: Diagnosis not present

## 2020-12-25 DIAGNOSIS — Z9889 Other specified postprocedural states: Secondary | ICD-10-CM | POA: Insufficient documentation

## 2020-12-25 DIAGNOSIS — G8929 Other chronic pain: Secondary | ICD-10-CM | POA: Insufficient documentation

## 2020-12-25 DIAGNOSIS — M47896 Other spondylosis, lumbar region: Secondary | ICD-10-CM | POA: Diagnosis not present

## 2020-12-25 DIAGNOSIS — M545 Low back pain, unspecified: Secondary | ICD-10-CM | POA: Insufficient documentation

## 2020-12-25 NOTE — Telephone Encounter (Signed)
Spoke to the patient and got her scheduled to see Dr. Agustin Cree on Friday. She verbalizes understanding and states she will be here.

## 2020-12-28 ENCOUNTER — Encounter: Payer: Self-pay | Admitting: Cardiology

## 2020-12-28 ENCOUNTER — Ambulatory Visit (INDEPENDENT_AMBULATORY_CARE_PROVIDER_SITE_OTHER): Payer: Medicare Other | Admitting: Cardiology

## 2020-12-28 ENCOUNTER — Ambulatory Visit (INDEPENDENT_AMBULATORY_CARE_PROVIDER_SITE_OTHER): Payer: Medicare Other

## 2020-12-28 ENCOUNTER — Other Ambulatory Visit: Payer: Self-pay

## 2020-12-28 VITALS — BP 136/62 | HR 54 | Ht 60.0 in | Wt 118.4 lb

## 2020-12-28 DIAGNOSIS — E7800 Pure hypercholesterolemia, unspecified: Secondary | ICD-10-CM | POA: Diagnosis not present

## 2020-12-28 DIAGNOSIS — I10 Essential (primary) hypertension: Secondary | ICD-10-CM | POA: Diagnosis not present

## 2020-12-28 DIAGNOSIS — G459 Transient cerebral ischemic attack, unspecified: Secondary | ICD-10-CM

## 2020-12-28 DIAGNOSIS — D473 Essential (hemorrhagic) thrombocythemia: Secondary | ICD-10-CM

## 2020-12-28 DIAGNOSIS — I5032 Chronic diastolic (congestive) heart failure: Secondary | ICD-10-CM

## 2020-12-28 NOTE — H&P (View-Only) (Signed)
Cardiology Consultation:    Date:  12/28/2020   ID:  Jordan Higgins, [place] 1946-01-17, MRN [id number]  PCP:  Lowella Dandy, NP  Cardiologist:  Jenne Campus, MD   Referring MD: Lowella Dandy, NP   Chief Complaint  Patient presents with   Code Stroke    12/20/2020  I am here to discuss TEE  History of Present Illness:    Jordan Higgins is a 75 y.o. female who is being seen today for the evaluation of TIA at the request of Moon, Amy A, NP.  Couple years ago she was seen by our partners Dr. Oval Linsey, the evaluation at the time revealed diastolic congestive heart failure.  She has been doing quite well since that time.  She was also diagnosed with thrombocytosis with JAK-2 mutation.  That being treated with hydroxyurea.  Recently she ended up coming to [facility] with what appears to be TIA.  MRI has been done which did not show any evidence of CVA, carotid duplex also did not show any significant carotic artery stenosis, echocardiogram being performed which showed low normal ejection fraction 50 to 55% injection of agitated saline did not show any evidence of intracardiac shunt.  While she was in the hospital she developed another episode of TIA.  Code stroke being called and with advice of neurologist she was given tPA.  She recovered completely.  Her work-up also included neck and head CT angiography which did not show any significant stenosis she did have however some irregularity which was described as mild atherosclerosis with calcifications.  She is coming today to our office to talk about transesophageal echocardiogram and evaluation for potential reason for CVA.  Overall since the time she been discharged from hospital she complained of being weak tired exhausted but otherwise seems to be doing well denies having any new TIA/CVA-like symptoms.  There is no chest pain tightness squeezing pressure burning chest there is no shortness of breath she described however to  have some palpitations in the morning what she mean by that she can feel her heart speeding up a little.  While in the hospital she did not have an episode of atrial fibrillation.  Past Medical History:  Diagnosis Date   Anxiety    Chronic diastolic heart failure ([place]) 01/18/2016   Chronic lower back pain    Essential thrombocythemia ([place])    GERD (gastroesophageal reflux disease)    Headache(784.0) 01/16/2012   "all day; think its related to TIA"   High cholesterol    Hypertension    Hyponatremia 01/18/2016   Hypothyroidism    PONV (postoperative nausea and vomiting)    "violently; only thing that works for her is scopolamine patch "   Stroke ([place]) 12/20/2020   TIA (transient ischemic attack) 01/16/2012    Past Surgical History:  Procedure Laterality Date   BACK SURGERY     BLADDER SUSPENSION  1990's   "w/rectal suspensiion"   CHOLECYSTECTOMY  ~ 2010   INCISION AND DRAINAGE OF WOUND  11/15/2011; early 12/2011   "abscess; S/P back OR"; "staph infection in wound"   [place]  11/06/2011   "shaved down 2 bulging discs"   [place]  ~ 12/07/2011   right upper arm    Current Medications: Current Meds  Medication Sig   aspirin 81 MG tablet Take 81 mg by mouth daily.   busPIRone (BUSPAR) 5 MG tablet Take 5 mg by mouth 3 (three) times  daily as needed (anxiety).   hydroxyurea (HYDREA) 500 MG capsule Take 1 capsule (500 mg total) by mouth 2 (two) times daily.   levothyroxine (SYNTHROID, LEVOTHROID) 88 MCG tablet Take 88 mcg by mouth See admin instructions. Every day EXCEPT on Sunday.   sucralfate (CARAFATE) 1 g tablet Take 1 g by mouth at bedtime.   traMADol-acetaminophen (ULTRACET) 37.5-325 MG tablet Take 1 tablet by mouth every 12 (twelve) hours as needed for moderate pain or severe pain.   valsartan-hydrochlorothiazide (DIOVAN-HCT) 80-12.5 MG tablet Take 1 tablet by mouth daily.   zolpidem (AMBIEN) 5 MG tablet Take 5 mg by mouth at  bedtime as needed for sleep.     Allergies:   Patient has no known allergies.   Social History   Socioeconomic History   Marital status: Married    Spouse name: Not on file   Number of children: Not on file   Years of education: Not on file   Highest education level: Not on file  Occupational History   Not on file  Tobacco Use   Smoking status: Never   Smokeless tobacco: Never  Substance and Sexual Activity   Alcohol use: No    Alcohol/week: 0.0 standard drinks   Drug use: No   Sexual activity: Not on file  Other Topics Concern   Not on file  Social History Narrative   Not on file   Social Determinants of Health   Financial Resource Strain: Not on file  Food Insecurity: Not on file  Transportation Needs: Not on file  Physical Activity: Not on file  Stress: Not on file  Social Connections: Not on file     Family History: The patient's family history includes Heart attack in her brother. ROS:   Please see the history of present illness.    All 14 point review of systems negative except as described per history of present illness.  EKGs/Labs/Other Studies Reviewed:    The following studies were reviewed today: CT of the head MRI of the head with negative reviewed by me.  CT angio of her chest and neck showed only luminal irregularities.  EKG:  EKG is  ordered today.  The ekg ordered today demonstrates EKG done today shows sinus rhythm actually sinus tachycardia rate 108, PVCs, nonspecific ST segment changes.  Recent Labs: 09/14/2020: ALT 19; BUN 18; Creatinine 1.0; Potassium 4.1; Sodium 128 11/15/2020: Hemoglobin 12.4; Platelets 872  Recent Lipid Panel    Component Value Date/Time   CHOL 138 01/17/2012 0403   TRIG 135 01/17/2012 0403   HDL 44 01/17/2012 0403   CHOLHDL 3.1 01/17/2012 0403   VLDL 27 01/17/2012 0403   LDLCALC 67 01/17/2012 0403    Physical Exam:    VS:  BP 136/62 (BP Location: Left Arm, Patient Position: Sitting)   Pulse (!) 54   Ht 5'  (1.524 m)   Wt 118 lb 6.4 oz (53.7 kg)   SpO2 94%   BMI 23.12 kg/m     Wt Readings from Last 3 Encounters:  12/28/20 118 lb 6.4 oz (53.7 kg)  11/15/20 123 lb 14.4 oz (56.2 kg)  09/14/20 127 lb 8 oz (57.8 kg)     GEN:  Well nourished, well developed in no acute distress HEENT: Normal NECK: No JVD; No carotid bruits LYMPHATICS: No lymphadenopathy CARDIAC: RRR, no murmurs, no rubs, no gallops RESPIRATORY:  Clear to auscultation without rales, wheezing or rhonchi  ABDOMEN: Soft, non-tender, non-distended MUSCULOSKELETAL:  No edema; No deformity  SKIN: Warm and  dry NEUROLOGIC:  Alert and oriented x 3 PSYCHIATRIC:  Normal affect   ASSESSMENT:    1. Hypertension, unspecified type   2. TIA (transient ischemic attack)   3. Chronic diastolic heart failure ([place])   4. Essential thrombocytosis ([place])   5. High cholesterol    PLAN:    In order of problems listed above:  History of TIA.  She did receive tPA with complete recovery.  MRI of the head negative, CT angiogram of the head and neck negative except for luminal irregularities, she has been put on dual antiplatelet therapy.  She takes clopidogrel as well as aspirin, she was referred to [place] for long-term monitoring of her heart for potential atrial fibrillation as well as for transesophageal echocardiogram.  She comes to my office with her daughter who works at [employer].  I explained to them TEE procedure and I explained to them the rationale of doing it all of the risk benefits as well as potential complications.  They are willing to proceed.  In the meantime I will put monitor on her it will be Zio patch for a week if that will be unrevealing we consider implantable loop recorder. Chronic diastolic congestive heart failure appears to be compensated.  Denies have any shortness of breath physical exam reveals euvolemic woman. Essential thrombocytosis that being followed by hematology team.  She is receiving hydroxyurea. Dyslipidemia: I  did review her fasting lipid profile which was done in 2021 with LDL of 135.  I will make arrangements for another cholesterol being checked and then she will required statin.   Medication Adjustments/Labs and Tests Ordered: Current medicines are reviewed at length with the patient today.  Concerns regarding medicines are outlined above.  Orders Placed This Encounter  Procedures   EKG 12-Lead   No orders of the defined types were placed in this encounter.   Signed, Park Liter, MD, [facility]. 12/28/2020 9:46 AM    [place]

## 2020-12-28 NOTE — Patient Instructions (Signed)
Medication Instructions:  Your physician recommends that you continue on your current medications as directed. Please refer to the Current Medication list given to you today.  *If you need a refill on your cardiac medications before your next appointment, please call your pharmacy*   Lab Work: Your physician recommends that you return for lab work in: Between 12/31/2020-01/03/2021 CBC, BMP If you have labs (blood work) drawn today and your tests are completely normal, you will receive your results only by: [place] (if you have MyChart) OR A paper copy in the mail If you have any lab test that is abnormal or we need to change your treatment, we will call you to review the results.   Testing/Procedures: You are scheduled for a TEE on 01/07/2021 with Dr. Payton Emerald.  Please arrive at the [facility] (Main Entrance A) at [facility]: [address] [place],  [address] at 11 am. (1 hour prior to procedure)  DIET: Nothing to eat or drink after midnight except a sip of water with medications (see medication instructions below)  FYI: For your safety, and to allow [place] to monitor your vital signs accurately during the surgery/procedure we request that   if you have artificial nails, gel coating, SNS etc. Please have those removed prior to your surgery/procedure. Not having the nail coverings /polish removed may result in cancellation or delay of your surgery/procedure.   Medication Instructions: Hold: Ambien the morning of the test.   Continue your anticoagulant: Plavix You will need to continue your anticoagulant after your procedure until you are told by your provider that it is safe to stop   Labs: Between 12/31/2020-01/03/2021  You must have a responsible person to drive you home and stay in the waiting area during your procedure. Failure to do so could result in cancellation.  Bring your insurance cards.  *Special Note: Every effort is made to have your procedure  done on time. Occasionally there are emergencies that occur at the hospital that may cause delays. Please be patient if a delay does occur.    A zio monitor was ordered today. It will remain on for 7 days. You will then return monitor and event diary in provided box. It takes 1-2 weeks for report to be downloaded and returned to [place]. We will call you with the results. If monitor falls off or has orange flashing light, please call Zio for further instructions.     Follow-Up: At [facility], you and your health needs are our priority.  As part of our continuing mission to provide you with exceptional heart care, we have created designated Provider Care Teams.  These Care Teams include your primary Cardiologist (physician) and Advanced Practice Providers (APPs -  Physician Assistants and Nurse Practitioners) who all work together to provide you with the care you need, when you need it.  We recommend signing up for the patient portal called "MyChart".  Sign up information is provided on this After Visit Summary.  MyChart is used to connect with patients for Virtual Visits (Telemedicine).  Patients are able to view lab/test results, encounter notes, upcoming appointments, etc.  Non-urgent messages can be sent to your provider as well.   To learn more about what you can do with MyChart, go to [email or web address].    Your next appointment:   2 month(s)  The format for your next appointment:   In Person  Provider:   Jenne Campus, MD   Other Instructions

## 2020-12-28 NOTE — Progress Notes (Signed)
Cardiology Consultation:    Date:  12/28/2020   ID:  Jordan Higgins, [place] 06-25-1945, MRN [id number]  PCP:  Jordan Dandy, NP  Cardiologist:  Jordan Campus, MD   Referring MD: Jordan Dandy, NP   Chief Complaint  Patient presents with   Code Stroke    12/20/2020  I am here to discuss TEE  History of Present Illness:    Jordan Higgins is Higgins 75 y.o. female who is being seen today for the evaluation of TIA at the request of Moon, Amy A, NP.  Couple years ago she was seen by our partners Dr. Oval Linsey, the evaluation at the time revealed diastolic congestive heart failure.  She has been doing quite well since that time.  She was also diagnosed with thrombocytosis with JAK-2 mutation.  That being treated with hydroxyurea.  Recently she ended up coming to [facility] with what appears to be TIA.  MRI has been done which did not show any evidence of CVA, carotid duplex also did not show any significant carotic artery stenosis, echocardiogram being performed which showed low normal ejection fraction 50 to 55% injection of agitated saline did not show any evidence of intracardiac shunt.  While she was in the hospital she developed another episode of TIA.  Code stroke being called and with advice of neurologist she was given tPA.  She recovered completely.  Her work-up also included neck and head CT angiography which did not show any significant stenosis she did have however some irregularity which was described as mild atherosclerosis with calcifications.  She is coming today to our office to talk about transesophageal echocardiogram and evaluation for potential reason for CVA.  Overall since the time she been discharged from hospital she complained of being weak tired exhausted but otherwise seems to be doing well denies having any new TIA/CVA-like symptoms.  There is no chest pain tightness squeezing pressure burning chest there is no shortness of breath she described however to  have some palpitations in the morning what she mean by that she can feel her heart speeding up Higgins little.  While in the hospital she did not have an episode of atrial fibrillation.  Past Medical History:  Diagnosis Date   Anxiety    Chronic diastolic heart failure ([place]) 01/18/2016   Chronic lower back pain    Essential thrombocythemia ([place])    GERD (gastroesophageal reflux disease)    Headache(784.0) 01/16/2012   "all day; think its related to TIA"   High cholesterol    Hypertension    Hyponatremia 01/18/2016   Hypothyroidism    PONV (postoperative nausea and vomiting)    "violently; only thing that works for her is scopolamine patch "   Stroke ([place]) 12/20/2020   TIA (transient ischemic attack) 01/16/2012    Past Surgical History:  Procedure Laterality Date   BACK SURGERY     BLADDER SUSPENSION  1990's   "w/rectal suspensiion"   CHOLECYSTECTOMY  ~ 2010   INCISION AND DRAINAGE OF WOUND  11/15/2011; early 12/2011   "abscess; S/P back OR"; "staph infection in wound"   [place]  11/06/2011   "shaved down 2 bulging discs"   [place]  ~ 12/07/2011   right upper arm    Current Medications: Current Meds  Medication Sig   aspirin 81 MG tablet Take 81 mg by mouth daily.   busPIRone (BUSPAR) 5 MG tablet Take 5 mg by mouth 3 (three) times  daily as needed (anxiety).   hydroxyurea (HYDREA) 500 MG capsule Take 1 capsule (500 mg total) by mouth 2 (two) times daily.   levothyroxine (SYNTHROID, LEVOTHROID) 88 MCG tablet Take 88 mcg by mouth See admin instructions. Every day EXCEPT on Sunday.   sucralfate (CARAFATE) 1 g tablet Take 1 g by mouth at bedtime.   traMADol-acetaminophen (ULTRACET) 37.5-325 MG tablet Take 1 tablet by mouth every 12 (twelve) hours as needed for moderate pain or severe pain.   valsartan-hydrochlorothiazide (DIOVAN-HCT) 80-12.5 MG tablet Take 1 tablet by mouth daily.   zolpidem (AMBIEN) 5 MG tablet Take 5 mg by mouth at  bedtime as needed for sleep.     Allergies:   Patient has no known allergies.   Social History   Socioeconomic History   Marital status: Married    Spouse name: Not on file   Number of children: Not on file   Years of education: Not on file   Highest education level: Not on file  Occupational History   Not on file  Tobacco Use   Smoking status: Never   Smokeless tobacco: Never  Substance and Sexual Activity   Alcohol use: No    Alcohol/week: 0.0 standard drinks   Drug use: No   Sexual activity: Not on file  Other Topics Concern   Not on file  Social History Narrative   Not on file   Social Determinants of Health   Financial Resource Strain: Not on file  Food Insecurity: Not on file  Transportation Needs: Not on file  Physical Activity: Not on file  Stress: Not on file  Social Connections: Not on file     Family History: The patient's family history includes Heart attack in her brother. ROS:   Please see the history of present illness.    All 14 point review of systems negative except as described per history of present illness.  EKGs/Labs/Other Studies Reviewed:    The following studies were reviewed today: CT of the head MRI of the head with negative reviewed by me.  CT angio of her chest and neck showed only luminal irregularities.  EKG:  EKG is  ordered today.  The ekg ordered today demonstrates EKG done today shows sinus rhythm actually sinus tachycardia rate 108, PVCs, nonspecific ST segment changes.  Recent Labs: 09/14/2020: ALT 19; BUN 18; Creatinine 1.0; Potassium 4.1; Sodium 128 11/15/2020: Hemoglobin 12.4; Platelets 872  Recent Lipid Panel    Component Value Date/Time   CHOL 138 01/17/2012 0403   TRIG 135 01/17/2012 0403   HDL 44 01/17/2012 0403   CHOLHDL 3.1 01/17/2012 0403   VLDL 27 01/17/2012 0403   LDLCALC 67 01/17/2012 0403    Physical Exam:    VS:  BP 136/62 (BP Location: Left Arm, Patient Position: Sitting)   Pulse (!) 54   Ht 5'  (1.524 m)   Wt 118 lb 6.4 oz (53.7 kg)   SpO2 94%   BMI 23.12 kg/m     Wt Readings from Last 3 Encounters:  12/28/20 118 lb 6.4 oz (53.7 kg)  11/15/20 123 lb 14.4 oz (56.2 kg)  09/14/20 127 lb 8 oz (57.8 kg)     GEN:  Well nourished, well developed in no acute distress HEENT: Normal NECK: No JVD; No carotid bruits LYMPHATICS: No lymphadenopathy CARDIAC: RRR, no murmurs, no rubs, no gallops RESPIRATORY:  Clear to auscultation without rales, wheezing or rhonchi  ABDOMEN: Soft, non-tender, non-distended MUSCULOSKELETAL:  No edema; No deformity  SKIN: Warm and  dry NEUROLOGIC:  Alert and oriented x 3 PSYCHIATRIC:  Normal affect   ASSESSMENT:    1. Hypertension, unspecified type   2. TIA (transient ischemic attack)   3. Chronic diastolic heart failure ([place])   4. Essential thrombocytosis ([place])   5. High cholesterol    PLAN:    In order of problems listed above:  History of TIA.  She did receive tPA with complete recovery.  MRI of the head negative, CT angiogram of the head and neck negative except for luminal irregularities, she has been put on dual antiplatelet therapy.  She takes clopidogrel as well as aspirin, she was referred to [place] for long-term monitoring of her heart for potential atrial fibrillation as well as for transesophageal echocardiogram.  She comes to my office with her daughter who works at [employer].  I explained to them TEE procedure and I explained to them the rationale of doing it all of the risk benefits as well as potential complications.  They are willing to proceed.  In the meantime I will put monitor on her it will be Zio patch for Higgins week if that will be unrevealing we consider implantable loop recorder. Chronic diastolic congestive heart failure appears to be compensated.  Denies have any shortness of breath physical exam reveals euvolemic woman. Essential thrombocytosis that being followed by hematology team.  She is receiving hydroxyurea. Dyslipidemia: I  did review her fasting lipid profile which was done in 2021 with LDL of 135.  I will make arrangements for another cholesterol being checked and then she will required statin.   Medication Adjustments/Labs and Tests Ordered: Current medicines are reviewed at length with the patient today.  Concerns regarding medicines are outlined above.  Orders Placed This Encounter  Procedures   EKG 12-Lead   No orders of the defined types were placed in this encounter.   Signed, Park Liter, MD, [facility]. 12/28/2020 9:46 AM    [place]

## 2021-01-01 ENCOUNTER — Other Ambulatory Visit: Payer: Self-pay

## 2021-01-01 DIAGNOSIS — Z8673 Personal history of transient ischemic attack (TIA), and cerebral infarction without residual deficits: Secondary | ICD-10-CM | POA: Diagnosis not present

## 2021-01-01 DIAGNOSIS — I10 Essential (primary) hypertension: Secondary | ICD-10-CM | POA: Diagnosis not present

## 2021-01-01 DIAGNOSIS — G459 Transient cerebral ischemic attack, unspecified: Secondary | ICD-10-CM

## 2021-01-01 DIAGNOSIS — E876 Hypokalemia: Secondary | ICD-10-CM | POA: Diagnosis not present

## 2021-01-01 DIAGNOSIS — R531 Weakness: Secondary | ICD-10-CM | POA: Diagnosis not present

## 2021-01-01 DIAGNOSIS — F419 Anxiety disorder, unspecified: Secondary | ICD-10-CM | POA: Diagnosis not present

## 2021-01-01 DIAGNOSIS — I5032 Chronic diastolic (congestive) heart failure: Secondary | ICD-10-CM

## 2021-01-01 DIAGNOSIS — E7800 Pure hypercholesterolemia, unspecified: Secondary | ICD-10-CM | POA: Diagnosis not present

## 2021-01-01 DIAGNOSIS — D509 Iron deficiency anemia, unspecified: Secondary | ICD-10-CM

## 2021-01-01 DIAGNOSIS — D473 Essential (hemorrhagic) thrombocythemia: Secondary | ICD-10-CM | POA: Diagnosis not present

## 2021-01-01 DIAGNOSIS — E871 Hypo-osmolality and hyponatremia: Secondary | ICD-10-CM | POA: Diagnosis not present

## 2021-01-01 DIAGNOSIS — Z79899 Other long term (current) drug therapy: Secondary | ICD-10-CM | POA: Diagnosis not present

## 2021-01-01 DIAGNOSIS — E538 Deficiency of other specified B group vitamins: Secondary | ICD-10-CM | POA: Diagnosis not present

## 2021-01-02 ENCOUNTER — Telehealth: Payer: Self-pay

## 2021-01-02 LAB — CBC
Hematocrit: 34.3 % (ref 34.0–46.6)
Hemoglobin: 11.9 g/dL (ref 11.1–15.9)
MCH: 35.4 pg — ABNORMAL HIGH (ref 26.6–33.0)
MCHC: 34.7 g/dL (ref 31.5–35.7)
MCV: 102 fL — ABNORMAL HIGH (ref 79–97)
Platelets: 598 10*3/uL — ABNORMAL HIGH (ref 150–450)
RBC: 3.36 x10E6/uL — ABNORMAL LOW (ref 3.77–5.28)
RDW: 13.9 % (ref 11.7–15.4)
WBC: 9.7 10*3/uL (ref 3.4–10.8)

## 2021-01-02 LAB — BASIC METABOLIC PANEL
BUN/Creatinine Ratio: 16 (ref 12–28)
BUN: 13 mg/dL (ref 8–27)
CO2: 27 mmol/L (ref 20–29)
Calcium: 9.7 mg/dL (ref 8.7–10.3)
Chloride: 89 mmol/L — ABNORMAL LOW (ref 96–106)
Creatinine, Ser: 0.8 mg/dL (ref 0.57–1.00)
Glucose: 105 mg/dL — ABNORMAL HIGH (ref 65–99)
Potassium: 3.3 mmol/L — ABNORMAL LOW (ref 3.5–5.2)
Sodium: 130 mmol/L — ABNORMAL LOW (ref 134–144)
eGFR: 76 mL/min/{1.73_m2} (ref >59)

## 2021-01-02 NOTE — Telephone Encounter (Signed)
Tried calling patient. No answer and no voicemail set up for me to leave a message. 

## 2021-01-02 NOTE — Telephone Encounter (Signed)
-----   Message from Jenean Lindau, MD sent at 01/02/2021  1:53 PM EDT ----- Potassium 20 mEq daily and Chem-7 in a week.  CBC is also abnormal.  Make sure these reports are also seen by Dr. Agustin Cree and sent to primary care also. Jenean Lindau, MD 01/02/2021 1:52 PM

## 2021-01-03 ENCOUNTER — Telehealth: Payer: Self-pay

## 2021-01-03 DIAGNOSIS — I5032 Chronic diastolic (congestive) heart failure: Secondary | ICD-10-CM

## 2021-01-03 MED ORDER — POTASSIUM CHLORIDE CRYS ER 20 MEQ PO TBCR: 20.0000 meq | EXTENDED_RELEASE_TABLET | Freq: Every day | ORAL | 3 refills | Status: DC

## 2021-01-03 NOTE — Telephone Encounter (Signed)
Spoke with patient regarding results and recommendation.  Patient verbalizes understanding and is agreeable to plan of care. Advised patient to call back with any issues or concerns.  

## 2021-01-03 NOTE — Telephone Encounter (Signed)
-----   Message from Jenean Lindau, MD sent at 01/02/2021  1:53 PM EDT ----- Potassium 20 mEq daily and Chem-7 in a week.  CBC is also abnormal.  Make sure these reports are also seen by Dr. Agustin Cree and sent to primary care also. Jenean Lindau, MD 01/02/2021 1:52 PM

## 2021-01-04 DIAGNOSIS — I471 Supraventricular tachycardia: Secondary | ICD-10-CM | POA: Diagnosis not present

## 2021-01-04 DIAGNOSIS — G459 Transient cerebral ischemic attack, unspecified: Secondary | ICD-10-CM

## 2021-01-07 ENCOUNTER — Ambulatory Visit (HOSPITAL_COMMUNITY): Payer: Medicare Other | Admitting: Anesthesiology

## 2021-01-07 ENCOUNTER — Ambulatory Visit (HOSPITAL_COMMUNITY)
Admission: RE | Admit: 2021-01-07 | Discharge: 2021-01-07 | Disposition: A | Discharge status: Home / Self Care | Payer: Medicare Other | Attending: Internal Medicine | Admitting: Internal Medicine

## 2021-01-07 ENCOUNTER — Encounter (HOSPITAL_COMMUNITY)
Admission: RE | Disposition: A | Discharge status: Home / Self Care | Payer: Self-pay | Source: Home / Self Care | Attending: Internal Medicine

## 2021-01-07 ENCOUNTER — Encounter (HOSPITAL_COMMUNITY): Payer: Self-pay | Admitting: Internal Medicine

## 2021-01-07 ENCOUNTER — Other Ambulatory Visit: Payer: Self-pay

## 2021-01-07 ENCOUNTER — Ambulatory Visit (HOSPITAL_BASED_OUTPATIENT_CLINIC_OR_DEPARTMENT_OTHER): Payer: Medicare Other

## 2021-01-07 DIAGNOSIS — I313 Pericardial effusion (noninflammatory): Secondary | ICD-10-CM | POA: Insufficient documentation

## 2021-01-07 DIAGNOSIS — I088 Other rheumatic multiple valve diseases: Secondary | ICD-10-CM | POA: Diagnosis not present

## 2021-01-07 DIAGNOSIS — I110 Hypertensive heart disease with heart failure: Secondary | ICD-10-CM | POA: Diagnosis not present

## 2021-01-07 DIAGNOSIS — I700 Atherosclerosis of aorta: Secondary | ICD-10-CM | POA: Insufficient documentation

## 2021-01-07 DIAGNOSIS — Z79899 Other long term (current) drug therapy: Secondary | ICD-10-CM | POA: Diagnosis not present

## 2021-01-07 DIAGNOSIS — G459 Transient cerebral ischemic attack, unspecified: Secondary | ICD-10-CM | POA: Diagnosis not present

## 2021-01-07 DIAGNOSIS — Z79890 Hormone replacement therapy (postmenopausal): Secondary | ICD-10-CM | POA: Insufficient documentation

## 2021-01-07 DIAGNOSIS — K219 Gastro-esophageal reflux disease without esophagitis: Secondary | ICD-10-CM | POA: Diagnosis not present

## 2021-01-07 DIAGNOSIS — Z7982 Long term (current) use of aspirin: Secondary | ICD-10-CM | POA: Diagnosis not present

## 2021-01-07 DIAGNOSIS — I5032 Chronic diastolic (congestive) heart failure: Secondary | ICD-10-CM | POA: Insufficient documentation

## 2021-01-07 DIAGNOSIS — E7800 Pure hypercholesterolemia, unspecified: Secondary | ICD-10-CM | POA: Diagnosis not present

## 2021-01-07 DIAGNOSIS — D473 Essential (hemorrhagic) thrombocythemia: Secondary | ICD-10-CM | POA: Diagnosis not present

## 2021-01-07 HISTORY — PX: TEE WITHOUT CARDIOVERSION: SHX5443

## 2021-01-07 HISTORY — PX: BUBBLE STUDY: SHX6837

## 2021-01-07 SURGERY — ECHOCARDIOGRAM, TRANSESOPHAGEAL
Anesthesia: Monitor Anesthesia Care

## 2021-01-07 MED ORDER — SODIUM CHLORIDE 0.9 % IV SOLN
INTRAVENOUS | Status: AC | PRN
  Administered 2021-01-07: 500 mL via INTRAMUSCULAR

## 2021-01-07 MED ORDER — BUTAMBEN-TETRACAINE-BENZOCAINE 2-2-14 % EX AERO
INHALATION_SPRAY | CUTANEOUS | Status: DC | PRN
  Administered 2021-01-07: 2 via TOPICAL

## 2021-01-07 MED ORDER — PROPOFOL 500 MG/50ML IV EMUL
INTRAVENOUS | Status: DC | PRN
  Administered 2021-01-07: 100 ug/kg/min via INTRAVENOUS

## 2021-01-07 MED ORDER — SODIUM CHLORIDE 0.9 % IV SOLN: INTRAVENOUS | Status: DC

## 2021-01-07 MED ORDER — POTASSIUM CHLORIDE CRYS ER 20 MEQ PO TBCR: 40.0000 meq | EXTENDED_RELEASE_TABLET | Freq: Once | ORAL | Status: DC

## 2021-01-07 MED ORDER — PROPOFOL 10 MG/ML IV BOLUS
INTRAVENOUS | Status: DC | PRN
  Administered 2021-01-07: 30 mg via INTRAVENOUS
  Administered 2021-01-07: 20 mg via INTRAVENOUS

## 2021-01-07 NOTE — Interval H&P Note (Signed)
History and Physical Interval Note:  01/07/2021 10:47 AM  Jordan Higgins  has presented today for surgery, with the diagnosis of TIA.  The various methods of treatment have been discussed with the patient and family. After consideration of risks, benefits and other options for treatment, the patient has consented to  Procedure(s): TRANSESOPHAGEAL ECHOCARDIOGRAM (TEE) (N/A) as a surgical intervention.  The patient's history has been reviewed, patient examined, no change in status, stable for surgery.  I have reviewed the patient's chart and labs.  Questions were answered to the patient's satisfaction.     Elouise Munroe

## 2021-01-07 NOTE — Progress Notes (Signed)
Echocardiogram Echocardiogram Transesophageal has been performed.  Oneal Deputy Taccara Bushnell RDCS 01/07/2021, 11:58 AM

## 2021-01-07 NOTE — Anesthesia Preprocedure Evaluation (Addendum)
Anesthesia Evaluation  Patient identified by MRN, date of birth, ID band Patient awake    Reviewed: Allergy & Precautions, NPO status , Patient's Chart, lab work & pertinent test results  History of Anesthesia Complications (+) PONV  Airway Mallampati: III  TM Distance: >3 FB Neck ROM: Full  Mouth opening: Limited Mouth Opening  Dental  (+) Caps, Dental Advisory Given Upper caps:   Pulmonary neg pulmonary ROS,    Pulmonary exam normal breath sounds clear to auscultation       Cardiovascular hypertension, Pt. on medications +CHF (grade 1 diastolic dysfunction)  Normal cardiovascular exam Rhythm:Regular Rate:Normal  Echo 2017: - Left ventricle: The cavity size was normal. Wall thickness was  normal. Systolic function was normal. The estimated ejection  fraction was in the range of 55% to 60%. Wall motion was normal;  there were no regional wall motion abnormalities. Doppler  parameters are consistent with abnormal left ventricular  relaxation (grade 1 diastolic dysfunction).  - Atrial septum: There was an atrial septal aneurysm.    Neuro/Psych  Headaches, PSYCHIATRIC DISORDERS Anxiety TIA   GI/Hepatic Neg liver ROS, GERD  ,  Endo/Other  Hypothyroidism   Renal/GU negative Renal ROS  negative genitourinary   Musculoskeletal  (+) Arthritis , Osteoarthritis,    Abdominal   Peds  Hematology  (+) Blood dyscrasia (on plavix), ,   Anesthesia Other Findings   Reproductive/Obstetrics negative OB ROS                            Anesthesia Physical Anesthesia Plan  ASA: 3  Anesthesia Plan: MAC   Post-op Pain Management:    Induction: Intravenous  PONV Risk Score and Plan: 3 and Propofol infusion and Treatment may vary due to age or medical condition  Airway Management Planned: Natural Airway  Additional Equipment:   Intra-op Plan:   Post-operative Plan:   Informed Consent:  I have reviewed the patients History and Physical, chart, labs and discussed the procedure including the risks, benefits and alternatives for the proposed anesthesia with the patient or authorized representative who has indicated his/her understanding and acceptance.     Dental advisory given  Plan Discussed with: CRNA  Anesthesia Plan Comments:         Anesthesia Quick Evaluation

## 2021-01-07 NOTE — Transfer of Care (Signed)
Immediate Anesthesia Transfer of Care Note  Patient: Jordan Higgins  Procedure(s) Performed: TRANSESOPHAGEAL ECHOCARDIOGRAM (TEE) BUBBLE STUDY  Patient Location: Endoscopy Unit  Anesthesia Type:MAC  Level of Consciousness: awake, alert , oriented and patient cooperative  Airway & Oxygen Therapy: Patient Spontanous Breathing and Patient connected to nasal cannula oxygen  Post-op Assessment: Report given to RN and Post -op Vital signs reviewed and stable  Post vital signs: Reviewed  Last Vitals:  Vitals Value Taken Time  BP    Temp    Pulse    Resp    SpO2      Last Pain:  Vitals:   01/07/21 1025  TempSrc: Oral  PainSc: 0-No pain         Complications: No notable events documented.

## 2021-01-07 NOTE — CV Procedure (Signed)
INDICATIONS: TIA  PROCEDURE:   Informed consent was obtained prior to the procedure. The risks, benefits and alternatives for the procedure were discussed and the patient comprehended these risks.  Risks include, but are not limited to, cough, sore throat, vomiting, nausea, somnolence, esophageal and stomach trauma or perforation, bleeding, low blood pressure, aspiration, pneumonia, infection, trauma to the teeth and death.    After a procedural time-out, the oropharynx was anesthetized with 20% benzocaine spray.   During this procedure the patient was administered propofol per anesthesia.  The patient's heart rate, blood pressure, and oxygen saturation were monitored continuously during the procedure. The period of conscious sedation was 35 minutes, of which I was present face-to-face 100% of this time.  The transesophageal probe was inserted in the esophagus and stomach without difficulty and multiple views were obtained.  The patient was kept under observation until the patient left the procedure room.  The patient left the procedure room in stable condition.   Agitated microbubble saline contrast was administered.  COMPLICATIONS:    There were no immediate complications.  FINDINGS:   FORMAL ECHOCARDIOGRAM REPORT PENDING LVEF 55% RV size and function normal Grossly normal valves, trivial MR, trivial AI, trivial PR, trivial TR Atrial septal aneurysm - hypermobile. Unable to demonstrate left to right shunt with color flow or agitated saline exam. Aortic atherosclerosis noted.  RECOMMENDATIONS:    Can dismiss from endo when alert.  Time Spent Directly with the Patient:  60 minutes   Jazzalynn Rhudy A Margaretann Loveless 01/07/2021, 11:51 AM

## 2021-01-07 NOTE — Discharge Instructions (Addendum)
TEE  YOU HAD AN CARDIAC PROCEDURE TODAY: Refer to the procedure report and other information in the discharge instructions given to you for any specific questions about what was found during the examination. If this information does not answer your questions, please call [facility] office at [phone] to clarify.   DIET: Your first meal following the procedure should be a light meal and then it is ok to progress to your normal diet. A half-sandwich or bowl of soup is an example of a good first meal. Heavy or fried foods are harder to digest and may make you feel nauseous or bloated. Drink plenty of fluids but you should avoid alcoholic beverages for 24 hours. If you had a esophageal dilation, please see attached instructions for diet.   ACTIVITY: Your care partner should take you home directly after the procedure. You should plan to take it easy, moving slowly for the rest of the day. You can resume normal activity the day after the procedure however YOU SHOULD NOT DRIVE, use power tools, machinery or perform tasks that involve climbing or major physical exertion for 24 hours (because of the sedation medicines used during the test).   SYMPTOMS TO REPORT IMMEDIATELY: A cardiologist can be reached at any hour. Please call [phone] for any of the following symptoms:  Vomiting of blood or coffee ground material  New, significant abdominal pain  New, significant chest pain or pain under the shoulder blades  Painful or persistently difficult swallowing  New shortness of breath  Black, tarry-looking or red, bloody stools  FOLLOW UP:  Please also call with any specific questions about appointments or follow up tests.   Special Note- On 8/8 your potassium level was a little low at 3.3- MD Margaretann Loveless would like you to take two potassium capsules  instead of your usual one capsule and eat something rich in potassium (banana,potato, tomato). Then on 01/08/21 you can go back to your normal dose of one  potassium pill.

## 2021-01-07 NOTE — Anesthesia Procedure Notes (Signed)
Procedure Name: MAC Date/Time: 01/07/2021 11:15 AM Performed by: Jenne Campus, CRNA Pre-anesthesia Checklist: Patient identified, Emergency Drugs available, Suction available and Patient being monitored Oxygen Delivery Method: Nasal cannula

## 2021-01-08 NOTE — Anesthesia Postprocedure Evaluation (Signed)
Anesthesia Post Note  Patient: Jordan Higgins  Procedure(s) Performed: TRANSESOPHAGEAL ECHOCARDIOGRAM (TEE) BUBBLE STUDY     Patient location during evaluation: Endoscopy Anesthesia Type: MAC Level of consciousness: awake and alert Pain management: pain level controlled Vital Signs Assessment: post-procedure vital signs reviewed and stable Respiratory status: spontaneous breathing, nonlabored ventilation, respiratory function stable and patient connected to nasal cannula oxygen Cardiovascular status: blood pressure returned to baseline and stable Postop Assessment: no apparent nausea or vomiting Anesthetic complications: no   No notable events documented.  Last Vitals:  Vitals:   01/07/21 1216 01/07/21 1220  BP:  (!) 164/70  Pulse: 71 76  Resp: (!) 21 15  Temp:    SpO2: 99% 100%    Last Pain:  Vitals:   01/07/21 1220  TempSrc:   PainSc: 0-No pain                 Broghan Pannone L Goran Olden

## 2021-01-09 ENCOUNTER — Encounter (HOSPITAL_COMMUNITY): Payer: Self-pay | Admitting: Internal Medicine

## 2021-01-09 DIAGNOSIS — G459 Transient cerebral ischemic attack, unspecified: Secondary | ICD-10-CM | POA: Diagnosis not present

## 2021-01-10 ENCOUNTER — Other Ambulatory Visit: Payer: Self-pay

## 2021-01-10 DIAGNOSIS — I5032 Chronic diastolic (congestive) heart failure: Secondary | ICD-10-CM | POA: Diagnosis not present

## 2021-01-11 LAB — BASIC METABOLIC PANEL
BUN/Creatinine Ratio: 13 (ref 12–28)
BUN: 12 mg/dL (ref 8–27)
CO2: 24 mmol/L (ref 20–29)
Calcium: 9.4 mg/dL (ref 8.7–10.3)
Chloride: 94 mmol/L — ABNORMAL LOW (ref 96–106)
Creatinine, Ser: 0.93 mg/dL (ref 0.57–1.00)
Glucose: 110 mg/dL — ABNORMAL HIGH (ref 65–99)
Potassium: 4.5 mmol/L (ref 3.5–5.2)
Sodium: 131 mmol/L — ABNORMAL LOW (ref 134–144)
eGFR: 63 mL/min/{1.73_m2} (ref >59)

## 2021-01-14 ENCOUNTER — Other Ambulatory Visit: Payer: Self-pay

## 2021-01-14 MED ORDER — METOPROLOL TARTRATE 25 MG PO TABS: 12.5000 mg | ORAL_TABLET | Freq: Two times a day (BID) | ORAL | 3 refills | Status: DC

## 2021-01-18 DIAGNOSIS — R634 Abnormal weight loss: Secondary | ICD-10-CM | POA: Diagnosis not present

## 2021-01-18 DIAGNOSIS — R531 Weakness: Secondary | ICD-10-CM | POA: Diagnosis not present

## 2021-01-18 DIAGNOSIS — R1013 Epigastric pain: Secondary | ICD-10-CM | POA: Diagnosis not present

## 2021-01-18 DIAGNOSIS — F419 Anxiety disorder, unspecified: Secondary | ICD-10-CM | POA: Diagnosis not present

## 2021-01-18 DIAGNOSIS — E538 Deficiency of other specified B group vitamins: Secondary | ICD-10-CM | POA: Diagnosis not present

## 2021-01-21 ENCOUNTER — Telehealth: Payer: Self-pay

## 2021-01-21 NOTE — Telephone Encounter (Signed)
I notified Dr Bobby Rumpf of below. He is agreeable to see pt later this week, when schedule allows. Pt called, & appt given for Friday, 01/25/21.     @ 825am - I spoke with pt. She reports that she has been in hospital and treated for TIA. She was treated with TPA and symptoms resolved per cardiology notes. They did CTA of H/N - which was negative, MRI negative, & TEE.EF 50-55%.  She saw her PCP on Friday, no blood work done. She has seen cardiology since discharge as well. Dr Agustin Cree has changed some of her meds. She would like to be seen by Dr Bobby Rumpf this week and have him check her out. She reports taking Hydrea '500mg'$  po qd (per June 22 office note, she was instructed to take the Hydrea '500mg'$  po BID).

## 2021-01-24 NOTE — Progress Notes (Signed)
[facility]  [address] [place],  [place]  [address] [phone]  Clinic Day:  01/25/2021  Referring physician: Lowella Dandy, NP  This document serves as a record of services personally performed by Marice Potter, MD. It was created on their behalf by Curry,Lauren E, a trained medical scribe. The creation of this record is based on the scribe's personal observations and the provider's statements to them.  HISTORY OF PRESENT ILLNESS:  The patient is a 75 y.o. female with essential thrombocythemia.  Due to the rise in her platelets, the patient was told to increase her hydroxyurea to 500 mg twice daily.  She comes in today to reassess her platelet count.  The patient claims she briefly took hydroxyurea twice daily, but got progressively weak to where she dropped her dose to just 1 pill daily.  She claims she was recently being worked up for [employer], as well as baseline cardiac problems.  Apparently, this workup came back unremarkable.  However, she was placed on Plavix.  Overall, the patient claims to be doing okay.  However, she continues to lose weight.  She complains of having early satiety for months.     PHYSICAL EXAM:  Blood pressure 121/62, pulse 61, temperature 97.9 F (36.6 C), resp. rate 14, height 5' (1.524 m), weight 116 lb 6.4 oz (52.8 kg), SpO2 96 %. Wt Readings from Last 3 Encounters:  01/25/21 116 lb 6.4 oz (52.8 kg)  01/07/21 120 lb (54.4 kg)  12/28/20 118 lb 6.4 oz (53.7 kg)   Body mass index is 22.73 kg/m. Performance status (ECOG): 1 Physical Exam Constitutional:      Appearance: Normal appearance. She is not ill-appearing.  HENT:     Mouth/Throat:     Mouth: Mucous membranes are moist.     Pharynx: Oropharynx is clear. No oropharyngeal exudate or posterior oropharyngeal erythema.  Cardiovascular:     Rate and Rhythm: Normal rate and regular rhythm.     Heart sounds: No murmur heard.   No friction rub. No gallop.   Pulmonary:     Effort: Pulmonary effort is normal. No respiratory distress.     Breath sounds: Normal breath sounds. No wheezing, rhonchi or rales.  Abdominal:     General: Bowel sounds are normal. There is no distension.     Palpations: Abdomen is soft. There is no mass.     Tenderness: There is no abdominal tenderness.  Musculoskeletal:        General: No swelling.     Right lower leg: No edema.     Left lower leg: No edema.  Lymphadenopathy:     Cervical: No cervical adenopathy.     Upper Body:     Right upper body: No supraclavicular or axillary adenopathy.     Left upper body: No supraclavicular or axillary adenopathy.     Lower Body: No right inguinal adenopathy. No left inguinal adenopathy.  Skin:    General: Skin is warm.     Coloration: Skin is not jaundiced.     Findings: No lesion or rash.  Neurological:     General: No focal deficit present.     Mental Status: She is alert and oriented to person, place, and time. Mental status is at baseline.     Cranial Nerves: Cranial nerves are intact.  Psychiatric:        Mood and Affect: Mood normal.        Behavior: Behavior normal.  Thought Content: Thought content normal.   LABS:   CBC Latest Ref Rng & Units 01/25/2021 01/01/2021 11/15/2020  WBC - 13.8 9.7 10.6  Hemoglobin 12.0 - 16.0 12.5 11.9 12.4  Hematocrit 36 - 46 36 34.3 37  Platelets 150 - 399 704(A) 598(H) 872(A)   CMP Latest Ref Rng & Units 01/10/2021 01/01/2021 09/14/2020  Glucose 65 - 99 mg/dL 110(H) 105(H) -  BUN 8 - 27 mg/dL '12 13 18  '$ Creatinine 0.57 - 1.00 mg/dL 0.93 0.80 1.0  Sodium 134 - 144 mmol/L 131(L) 130(L) 128(A)  Potassium 3.5 - 5.2 mmol/L 4.5 3.3(L) 4.1  Chloride 96 - 106 mmol/L 94(L) 89(L) 95(A)  CO2 20 - 29 mmol/L 24 27 28(A)  Calcium 8.7 - 10.3 mg/dL 9.4 9.7 8.7  Total Protein 6.0 - 8.3 g/dL - - -  Total Bilirubin 0.3 - 1.2 mg/dL - - -  Alkaline Phos 25 - 125 - - 72  AST 13 - 35 - - 53(A)  ALT 7 - 35 - - 19    ASSESSMENT & PLAN:   Assessment/Plan:  A 75 y.o. female with essential thrombocythemia.  When evaluating her platelets today, her platelets are still above the goal range of 400-600.  As mentioned previously, she has not been taking her hydroxyurea at 500 mg twice daily.  She knows this is necessary to get her platelet count down and prevent a clotting disorder from developing.  The patient claims she may have had a mini-stroke, which is another reason why it is imperative to bring down her platelets.  The patient voices understanding of this and vows to start taking hydroxyurea at 500 mg twice daily.  I am also concerned with her weight loss and early satiety.  Based upon these symptoms, I will have her undergo a CT scan of her abdomen/pelvis to ensure occult, ominous pathology is not present.  This scan will be done next week; I will see her back a day later to go over these images and their implications.  The patient understands all the plans discussed today and is in agreement with them.    I, Rita Ohara, am acting as scribe for Marice Potter, MD    I have reviewed this report as typed by the medical scribe, and it is complete and accurate.  Dequincy Macarthur Critchley, MD

## 2021-01-25 ENCOUNTER — Encounter: Payer: Self-pay | Admitting: Oncology

## 2021-01-25 ENCOUNTER — Inpatient Hospital Stay: Payer: Medicare Other | Attending: Oncology | Admitting: Oncology

## 2021-01-25 ENCOUNTER — Inpatient Hospital Stay: Payer: Medicare Other

## 2021-01-25 ENCOUNTER — Other Ambulatory Visit: Payer: Self-pay

## 2021-01-25 ENCOUNTER — Other Ambulatory Visit: Payer: Self-pay | Admitting: Oncology

## 2021-01-25 DIAGNOSIS — D473 Essential (hemorrhagic) thrombocythemia: Secondary | ICD-10-CM

## 2021-01-25 DIAGNOSIS — R6881 Early satiety: Secondary | ICD-10-CM

## 2021-01-25 DIAGNOSIS — D649 Anemia, unspecified: Secondary | ICD-10-CM | POA: Diagnosis not present

## 2021-01-25 DIAGNOSIS — R634 Abnormal weight loss: Secondary | ICD-10-CM

## 2021-01-25 LAB — CBC AND DIFFERENTIAL
HCT: 36 (ref 36–46)
Hemoglobin: 12.5 (ref 12.0–16.0)
Neutrophils Absolute: 11.59
Platelets: 704 — AB (ref 150–399)
WBC: 13.8

## 2021-01-25 LAB — CBC: RBC: 3.42 — AB (ref 3.87–5.11)

## 2021-01-28 ENCOUNTER — Telehealth: Payer: Self-pay | Admitting: Oncology

## 2021-01-28 NOTE — Telephone Encounter (Signed)
01/28/21 Spoke with patient and confirmed ct imaging appt

## 2021-02-01 ENCOUNTER — Encounter: Payer: Self-pay | Admitting: Oncology

## 2021-02-01 DIAGNOSIS — K429 Umbilical hernia without obstruction or gangrene: Secondary | ICD-10-CM | POA: Diagnosis not present

## 2021-02-01 DIAGNOSIS — D473 Essential (hemorrhagic) thrombocythemia: Secondary | ICD-10-CM | POA: Diagnosis not present

## 2021-02-01 DIAGNOSIS — K5730 Diverticulosis of large intestine without perforation or abscess without bleeding: Secondary | ICD-10-CM | POA: Diagnosis not present

## 2021-02-01 DIAGNOSIS — N281 Cyst of kidney, acquired: Secondary | ICD-10-CM | POA: Diagnosis not present

## 2021-02-01 DIAGNOSIS — R634 Abnormal weight loss: Secondary | ICD-10-CM | POA: Diagnosis not present

## 2021-02-15 ENCOUNTER — Other Ambulatory Visit: Payer: Medicare Other

## 2021-02-15 ENCOUNTER — Ambulatory Visit: Payer: Medicare Other | Admitting: Oncology

## 2021-02-15 NOTE — Progress Notes (Signed)
[facility]  [address] [place],  [place]  [address] [phone]  Clinic Day:  02/22/2021  Referring physician: Lowella Dandy, NP  This document serves as a record of services personally performed by Marice Potter, MD. It was created on their behalf by Curry,Lauren E, a trained medical scribe. The creation of this record is based on the scribe's personal observations and the provider's statements to them.  HISTORY OF PRESENT ILLNESS:  The patient is a 75 y.o. female with essential thrombocythemia.  Due to the rise in her platelets, the patient was told to increase her hydroxyurea to 500 mg twice daily.  However, she has still been taking her hydroxyurea once daily, which frustrates her daughter.  The patient has a recent history of a stroke, which has further concerned me about her elevated platelets and how she has not been taking her hydroxyurea at the recommended doses.  Overall, the patient claims to still feel weak.  Based upon her weight loss, CT scans were done to ensure no occult disease process is present.  She comes in today to review her scans, as well as to reassess her peripheral counts.    PHYSICAL EXAM:  Blood pressure 137/62, pulse 63, temperature 98 F (36.7 C), resp. rate 14, height 5' (1.524 m), weight 118 lb 1.6 oz (53.6 kg), SpO2 97 %. Wt Readings from Last 3 Encounters:  02/22/21 118 lb 1.6 oz (53.6 kg)  01/25/21 116 lb 6.4 oz (52.8 kg)  01/07/21 120 lb (54.4 kg)   Body mass index is 23.06 kg/m. Performance status (ECOG): 1 Physical Exam Constitutional:      Appearance: Normal appearance. She is not ill-appearing.  HENT:     Mouth/Throat:     Mouth: Mucous membranes are moist.     Pharynx: Oropharynx is clear. No oropharyngeal exudate or posterior oropharyngeal erythema.  Cardiovascular:     Rate and Rhythm: Normal rate and regular rhythm.     Heart sounds: No murmur heard.   No friction rub. No gallop.  Pulmonary:      Effort: Pulmonary effort is normal. No respiratory distress.     Breath sounds: Normal breath sounds. No wheezing, rhonchi or rales.  Abdominal:     General: Bowel sounds are normal. There is no distension.     Palpations: Abdomen is soft. There is no mass.     Tenderness: There is no abdominal tenderness.  Musculoskeletal:        General: No swelling.     Right lower leg: No edema.     Left lower leg: No edema.  Lymphadenopathy:     Cervical: No cervical adenopathy.     Upper Body:     Right upper body: No supraclavicular or axillary adenopathy.     Left upper body: No supraclavicular or axillary adenopathy.     Lower Body: No right inguinal adenopathy. No left inguinal adenopathy.  Skin:    General: Skin is warm.     Coloration: Skin is not jaundiced.     Findings: No lesion or rash.  Neurological:     General: No focal deficit present.     Mental Status: She is alert and oriented to person, place, and time. Mental status is at baseline.     Cranial Nerves: Cranial nerves are intact.  Psychiatric:        Mood and Affect: Mood normal.        Behavior: Behavior normal.  Thought Content: Thought content normal.   SCANS: EXAM: CT ABDOMEN AND PELVIS WITH CONTRAST  FINDINGS: Lower chest: Normal heart size. Lung bases are clear. No large area pulmonary consolidation. No pleural effusion.  Hepatobiliary: Liver is normal in size and contour. Subcentimeter too small to characterize low-attenuation lesions within the hepatic dome and left hepatic lobe, stable from recent prior. Prior cholecystectomy. No intrahepatic or extrahepatic biliary ductal dilatation.  Pancreas: Unremarkable  Spleen: Unremarkable  Adrenals/Urinary Tract: Normal adrenal glands. Kidneys enhance symmetrically with contrast. Stable 4.5 cm exophytic cyst off of the midpole left kidney. Too small to characterize low-attenuation lesion midpole right kidney. Urinary bladder is  unremarkable.  Stomach/Bowel: Normal appendix. Sigmoid colonic diverticulosis. No CT evidence for acute diverticulitis. Redemonstrated mild submucosal fatty infiltration involving the distal ileum. No surrounding inflammatory stranding.  Vascular/Lymphatic: Normal caliber abdominal aorta. No retroperitoneal lymphadenopathy. Similar prominent ileocecal lymph nodes measuring up to 8 mm (image 51; series 2).  Reproductive: Uterus is unremarkable. Unchanged 1.5 cm left ovarian cyst (image 64; series 2).  Other: Fat containing umbilical hernia.  Musculoskeletal: No aggressive or acute appearing osseous lesions. Lower thoracic and lumbar spine degenerative changes. Unchanged superior endplate compression deformity of the L1 vertebral body.  IMPRESSION: 1. No acute process within the abdomen or pelvis. 2. Submucosal fatty infiltration involving distal ileum suggestive of prior chronic inflammation. No acute inflammatory change on current exam. 3. Fat containing umbilical hernia.   LABS:   CBC Latest Ref Rng & Units 02/22/2021 01/25/2021 01/01/2021  WBC - 12.8 13.8 9.7  Hemoglobin 12.0 - 16.0 11.0(A) 12.5 11.9  Hematocrit 36 - 46 33(A) 36 34.3  Platelets 150 - 399 885(A) 704(A) 598(H)   CMP Latest Ref Rng & Units 01/10/2021 01/01/2021 09/14/2020  Glucose 65 - 99 mg/dL 110(H) 105(H) -  BUN 8 - 27 mg/dL '12 13 18  '$ Creatinine 0.57 - 1.00 mg/dL 0.93 0.80 1.0  Sodium 134 - 144 mmol/L 131(L) 130(L) 128(A)  Potassium 3.5 - 5.2 mmol/L 4.5 3.3(L) 4.1  Chloride 96 - 106 mmol/L 94(L) 89(L) 95(A)  CO2 20 - 29 mmol/L 24 27 28(A)  Calcium 8.7 - 10.3 mg/dL 9.4 9.7 8.7  Total Protein 6.0 - 8.3 g/dL - - -  Total Bilirubin 0.3 - 1.2 mg/dL - - -  Alkaline Phos 25 - 125 - - 72  AST 13 - 35 - - 53(A)  ALT 7 - 35 - - 19    Ref. Range 02/22/2021 09:59  Iron Latest Ref Range: 28 - 170 ug/dL 28  UIBC Latest Units: ug/dL 326  TIBC Latest Ref Range: 250 - 450 ug/dL 354  Saturation Ratios Latest Ref Range:  10.4 - 31.8 % 8 (L)  Ferritin Latest Ref Range: 11 - 307 ng/mL 31     ASSESSMENT & PLAN:  Assessment/Plan:  A 75 y.o. female with essential thrombocythemia.  In clinic today, I went over her CT scan images with her, for which no occult disease process was seen.  With respect to her labs today, her platelets are much higher than where they should be.  I stressed to her the importance of increasing her hydroxyurea to 500 mg twice daily.  She knows this is necessary to get her platelet count down and prevent another clotting complication, such as a stroke, from developing.   As her hemoglobin and iron parameters are lower versus previously, I will arrange for her to receive IV iron over these next few weeks.  I will see her back in 2  months to reassess her peripheral counts.   The patient understands all the plans discussed today and is in agreement with them.    I, Rita Ohara, am acting as scribe for Marice Potter, MD    I have reviewed this report as typed by the medical scribe, and it is complete and accurate.  Dequincy Macarthur Critchley, MD

## 2021-02-22 ENCOUNTER — Other Ambulatory Visit: Payer: Self-pay | Admitting: Oncology

## 2021-02-22 ENCOUNTER — Inpatient Hospital Stay (INDEPENDENT_AMBULATORY_CARE_PROVIDER_SITE_OTHER): Payer: Medicare Other | Admitting: Oncology

## 2021-02-22 ENCOUNTER — Telehealth: Payer: Self-pay

## 2021-02-22 ENCOUNTER — Inpatient Hospital Stay: Payer: Medicare Other | Attending: Oncology

## 2021-02-22 ENCOUNTER — Other Ambulatory Visit: Payer: Self-pay | Admitting: Hematology and Oncology

## 2021-02-22 ENCOUNTER — Telehealth: Payer: Self-pay | Admitting: Oncology

## 2021-02-22 VITALS — BP 137/62 | HR 63 | Temp 98.0°F | Resp 14 | Ht 60.0 in | Wt 118.1 lb

## 2021-02-22 DIAGNOSIS — D509 Iron deficiency anemia, unspecified: Secondary | ICD-10-CM

## 2021-02-22 DIAGNOSIS — D473 Essential (hemorrhagic) thrombocythemia: Secondary | ICD-10-CM

## 2021-02-22 DIAGNOSIS — D508 Other iron deficiency anemias: Secondary | ICD-10-CM

## 2021-02-22 LAB — CBC AND DIFFERENTIAL
HCT: 33 — AB (ref 36–46)
Hemoglobin: 11 — AB (ref 12.0–16.0)
Neutrophils Absolute: 11.01
Platelets: 885 — AB (ref 150–399)
WBC: 12.8

## 2021-02-22 LAB — IRON AND TIBC
Iron: 28 ug/dL (ref 28–170)
Saturation Ratios: 8 % — ABNORMAL LOW (ref 10.4–31.8)
TIBC: 354 ug/dL (ref 250–450)
UIBC: 326 ug/dL

## 2021-02-22 LAB — CBC
MCV: 105 — AB (ref 81–99)
RBC: 3.18 — AB (ref 3.87–5.11)

## 2021-02-22 LAB — FERRITIN: Ferritin: 31 ng/mL (ref 11–307)

## 2021-02-22 NOTE — Telephone Encounter (Signed)
Per 9/23 LOS, patient scheduled for Nov Appt's.  Patient's daughter entered Appt's in her phone

## 2021-02-22 NOTE — Telephone Encounter (Signed)
Jordan Higgins in Hematology called with critically high platelet count. Platelet count: 885. Dr. Bobby Rumpf made aware

## 2021-02-25 ENCOUNTER — Ambulatory Visit (INDEPENDENT_AMBULATORY_CARE_PROVIDER_SITE_OTHER): Payer: Medicare Other | Admitting: Cardiology

## 2021-02-25 ENCOUNTER — Other Ambulatory Visit: Payer: Self-pay

## 2021-02-25 ENCOUNTER — Encounter: Payer: Self-pay | Admitting: Cardiology

## 2021-02-25 VITALS — BP 146/84 | HR 74 | Ht 60.0 in | Wt 118.6 lb

## 2021-02-25 DIAGNOSIS — E785 Hyperlipidemia, unspecified: Secondary | ICD-10-CM | POA: Diagnosis not present

## 2021-02-25 DIAGNOSIS — G459 Transient cerebral ischemic attack, unspecified: Secondary | ICD-10-CM

## 2021-02-25 DIAGNOSIS — I10 Essential (primary) hypertension: Secondary | ICD-10-CM | POA: Diagnosis not present

## 2021-02-25 DIAGNOSIS — I5032 Chronic diastolic (congestive) heart failure: Secondary | ICD-10-CM

## 2021-02-25 NOTE — Progress Notes (Signed)
Cardiology Office Note:    Date:  02/25/2021   ID:  Jordan Higgins, [place] 13-Apr-1946, MRN [id number]  PCP:  Lowella Dandy, NP  Cardiologist:  Jenne Campus, MD    Referring MD: Lowella Dandy, NP   Chief Complaint  Patient presents with   Medication Management    History of Present Illness:    Jordan Higgins is a 75 y.o. female who was recently admitted with episode of what appears to be TIA.  Quite extensive evaluation has been performed after that she did have angiogram of her artery in her neck as well as echocardiogram and then eventually TEE trying to find a cardiac source of emboli.  She also had Zio patch for 2 weeks which showed on supraventricular tachycardia as well as APCs but no atrial fibrillation.  She is coming today 2 months for follow-up.  She also got thrombocytosis and also she apparently skipped some medication she did take medication the right leg.  She had recently visit with her oncologist/hematologist Dr. Cassandria Santee who told her that that may be what is responsible for his CVA.  The purpose of today's visit was potentially talk about implantable loop recorder but since we do have a more likely explanation for his TIA I will hold off with implantation.  Past Medical History:  Diagnosis Date   Anxiety    Chronic diastolic heart failure ([place]) 01/18/2016   Chronic lower back pain    Essential thrombocythemia ([place])    GERD (gastroesophageal reflux disease)    Headache(784.0) 01/16/2012   "all day; think its related to TIA"   High cholesterol    Hypertension    Hyponatremia 01/18/2016   Hypothyroidism    PONV (postoperative nausea and vomiting)    "violently; only thing that works for her is scopolamine patch "   Stroke ([place]) 12/20/2020   TIA (transient ischemic attack) 01/16/2012    Past Surgical History:  Procedure Laterality Date   BACK SURGERY     BLADDER SUSPENSION  1990's   "w/rectal suspensiion"   BUBBLE STUDY  01/07/2021   Procedure:  BUBBLE STUDY;  Surgeon: Elouise Munroe, MD;  Location: [facility] ENDOSCOPY;  Service: Cardiology;;   CHOLECYSTECTOMY  ~ 2010   [place]  11/15/2011; early 12/2011   "abscess; S/P back OR"; "staph infection in wound"   [place]  11/06/2011   "shaved down 2 bulging discs"   [place]  ~ 12/07/2011   right upper arm   TEE WITHOUT CARDIOVERSION N/A 01/07/2021   Procedure: TRANSESOPHAGEAL ECHOCARDIOGRAM (TEE);  Surgeon: Elouise Munroe, MD;  Location: [facility];  Service: Cardiology;  Laterality: N/A;    Current Medications: Current Meds  Medication Sig   aspirin EC 81 MG tablet Take 81 mg by mouth in the morning. Swallow whole.   busPIRone (BUSPAR) 5 MG tablet Take 5 mg by mouth 3 (three) times daily as needed (nerves).   clopidogrel (PLAVIX) 75 MG tablet Take 75 mg by mouth daily in the afternoon.   fluticasone (FLONASE) 50 MCG/ACT nasal spray Place 1 spray into both nostrils daily as needed for allergies or rhinitis (sinus pressure/allergies).   hydroxyurea (HYDREA) 500 MG capsule Take 1 capsule (500 mg total) by mouth 2 (two) times daily. (Patient taking differently: Take 500 mg by mouth every evening.)   hyoscyamine (ANASPAZ) 0.125 MG TBDP disintergrating tablet Place 0.0625-0.125 mg under the tongue as needed for bladder spasms or cramping.  ibuprofen (ADVIL) 200 MG tablet Take 200-400 mg by mouth every 8 (eight) hours as needed for mild pain or moderate pain (pain.).   levothyroxine (SYNTHROID, LEVOTHROID) 88 MCG tablet Take 88 mcg by mouth See admin instructions. Take 1 tablet (88 mcg) by mouth in the morning every day EXCEPT on Sunday.   loratadine (CLARITIN) 10 MG tablet Take 10 mg by mouth daily as needed for allergies (allergies/sinus issues.).   metoprolol tartrate (LOPRESSOR) 25 MG tablet Take 0.5 tablets (12.5 mg total) by mouth 2 (two) times daily.   Polyethyl Glycol-Propyl Glycol (SYSTANE) 0.4-0.3 % SOLN Place  1-2 drops into both eyes 3 (three) times daily as needed (dry/irritated eyes.).   potassium chloride SA (KLOR-CON) 20 MEQ tablet Take 1 tablet (20 mEq total) by mouth daily.   sucralfate (CARAFATE) 1 g tablet Take 1 g by mouth at bedtime.   traMADol-acetaminophen (ULTRACET) 37.5-325 MG tablet Take 1 tablet by mouth 2 (two) times daily as needed for moderate pain or severe pain (chronic pain.).   valsartan-hydrochlorothiazide (DIOVAN-HCT) 80-12.5 MG tablet Take 1 tablet by mouth in the morning.   zolpidem (AMBIEN) 10 MG tablet Take 10 mg by mouth at bedtime as needed for sleep.     Allergies:   Patient has no known allergies.   Social History   Socioeconomic History   Marital status: Married    Spouse name: Not on file   Number of children: Not on file   Years of education: Not on file   Highest education level: Not on file  Occupational History   Not on file  Tobacco Use   Smoking status: Never   Smokeless tobacco: Never  Substance and Sexual Activity   Alcohol use: No    Alcohol/week: 0.0 standard drinks   Drug use: No   Sexual activity: Not on file  Other Topics Concern   Not on file  Social History Narrative   Not on file   Social Determinants of Health   Financial Resource Strain: Not on file  Food Insecurity: Not on file  Transportation Needs: Not on file  Physical Activity: Not on file  Stress: Not on file  Social Connections: Not on file     Family History: The patient's family history includes Heart attack in her brother. ROS:   Please see the history of present illness.    All 14 point review of systems negative except as described per history of present illness  EKGs/Labs/Other Studies Reviewed:      Recent Labs: 09/14/2020: ALT 19 01/10/2021: BUN 12; Creatinine, Ser 0.93; Potassium 4.5; Sodium 131 02/22/2021: Hemoglobin 11.0; Platelets 885  Recent Lipid Panel    Component Value Date/Time   CHOL 138 01/17/2012 0403   TRIG 135 01/17/2012 0403   HDL  44 01/17/2012 0403   CHOLHDL 3.1 01/17/2012 0403   VLDL 27 01/17/2012 0403   LDLCALC 67 01/17/2012 0403    Physical Exam:    VS:  BP (!) 146/84 (BP Location: Left Arm, Patient Position: Sitting)   Pulse 74   Ht 5' (1.524 m)   Wt 118 lb 9.6 oz (53.8 kg)   SpO2 96%   BMI 23.16 kg/m     Wt Readings from Last 3 Encounters:  02/25/21 118 lb 9.6 oz (53.8 kg)  02/22/21 118 lb 1.6 oz (53.6 kg)  01/25/21 116 lb 6.4 oz (52.8 kg)     GEN:  Well nourished, well developed in no acute distress HEENT: Normal NECK: No JVD; No carotid bruits  LYMPHATICS: No lymphadenopathy CARDIAC: RRR, no murmurs, no rubs, no gallops RESPIRATORY:  Clear to auscultation without rales, wheezing or rhonchi  ABDOMEN: Soft, non-tender, non-distended MUSCULOSKELETAL:  No edema; No deformity  SKIN: Warm and dry LOWER EXTREMITIES: no swelling NEUROLOGIC:  Alert and oriented x 3 PSYCHIATRIC:  Normal affect   ASSESSMENT:    1. Hypertension, unspecified type   2. Chronic diastolic heart failure ([place])   3. TIA (transient ischemic attack)   4. Dyslipidemia    PLAN:    In order of problems listed above:  Essential hypertension blood pressure seems to be well controlled continue present management. TIA denies having any.  She was on dual antiplatelet therapy.  Stable it looks like her TIA was related to hematological issues. Supraventricular tachycardia with APCs based on the monitor she wear she is on small dose of beta-blocker seems to be tolerating well will continue. Dyslipidemia I do not have her this year fasting lipid profile I see her LDL of 135 HDL 71 this is from last year August.  We will repeat the test.   Medication Adjustments/Labs and Tests Ordered: Current medicines are reviewed at length with the patient today.  Concerns regarding medicines are outlined above.  Orders Placed This Encounter  Procedures   Basic metabolic panel   Medication changes: No orders of the defined types were placed  in this encounter.   Signed, Park Liter, MD, [facility] 02/25/2021 [place]

## 2021-02-25 NOTE — Patient Instructions (Signed)
Medication Instructions:  Your physician recommends that you continue on your current medications as directed. Please refer to the Current Medication list given to you today.  *If you need a refill on your cardiac medications before your next appointment, please call your pharmacy*   Lab Work: None ordered If you have labs (blood work) drawn today and your tests are completely normal, you will receive your results only by: MyChart Message (if you have MyChart) OR A paper copy in the mail If you have any lab test that is abnormal or we need to change your treatment, we will call you to review the results.   Testing/Procedures: None ordered   Follow-Up: At [facility], you and your health needs are our priority.  As part of our continuing mission to provide you with exceptional heart care, we have created designated Provider Care Teams.  These Care Teams include your primary Cardiologist (physician) and Advanced Practice Providers (APPs -  Physician Assistants and Nurse Practitioners) who all work together to provide you with the care you need, when you need it.  We recommend signing up for the patient portal called "MyChart".  Sign up information is provided on this After Visit Summary.  MyChart is used to connect with patients for Virtual Visits (Telemedicine).  Patients are able to view lab/test results, encounter notes, upcoming appointments, etc.  Non-urgent messages can be sent to your provider as well.   To learn more about what you can do with MyChart, go to [email or web address].    Your next appointment:   4 month(s)  The format for your next appointment:   In Person  Provider:   Robert Krasowski, MD   Other Instructions NA  

## 2021-02-27 LAB — BASIC METABOLIC PANEL
BUN/Creatinine Ratio: 15 (ref 12–28)
BUN: 13 mg/dL (ref 8–27)
CO2: 23 mmol/L (ref 20–29)
Calcium: 9.2 mg/dL (ref 8.7–10.3)
Chloride: 90 mmol/L — ABNORMAL LOW (ref 96–106)
Creatinine, Ser: 0.84 mg/dL (ref 0.57–1.00)
Glucose: 124 mg/dL — ABNORMAL HIGH (ref 70–99)
Potassium: 4.9 mmol/L (ref 3.5–5.2)
Sodium: 129 mmol/L — ABNORMAL LOW (ref 134–144)
eGFR: 72 mL/min/{1.73_m2} (ref >59)

## 2021-03-03 DIAGNOSIS — D509 Iron deficiency anemia, unspecified: Secondary | ICD-10-CM | POA: Insufficient documentation

## 2021-03-06 ENCOUNTER — Encounter: Payer: Self-pay | Admitting: Oncology

## 2021-03-07 ENCOUNTER — Other Ambulatory Visit: Payer: Self-pay

## 2021-03-07 ENCOUNTER — Inpatient Hospital Stay: Payer: Medicare Other | Attending: Oncology

## 2021-03-07 VITALS — BP 136/51 | HR 56 | Temp 98.0°F | Resp 18 | Ht 60.0 in | Wt 117.8 lb

## 2021-03-07 DIAGNOSIS — D509 Iron deficiency anemia, unspecified: Secondary | ICD-10-CM

## 2021-03-07 DIAGNOSIS — D473 Essential (hemorrhagic) thrombocythemia: Secondary | ICD-10-CM | POA: Insufficient documentation

## 2021-03-07 MED ORDER — SODIUM CHLORIDE 0.9 % IV SOLN: Freq: Once | INTRAVENOUS | Status: AC

## 2021-03-07 MED ORDER — SODIUM CHLORIDE 0.9 % IV SOLN
510.0000 mg | Freq: Once | INTRAVENOUS | Status: AC
  Administered 2021-03-07: 510 mg via INTRAVENOUS
  Filled 2021-03-07: qty 17

## 2021-03-07 NOTE — Patient Instructions (Signed)
Ferumoxytol Injection What is this medication? FERUMOXYTOL (FER ue MOX i tol) treats low levels of iron in your body (iron deficiency anemia). Iron is a mineral that plays an important role in making red blood cells, which carry oxygen from your lungs to the rest of your body. This medicine may be used for other purposes; ask your health care provider or pharmacist if you have questions. COMMON BRAND NAME(S): Feraheme What should I tell my care team before I take this medication? They need to know if you have any of these conditions: Anemia not caused by low iron levels High levels of iron in the blood Magnetic resonance imaging (MRI) test scheduled An unusual or allergic reaction to iron, other medications, foods, dyes, or preservatives Pregnant or trying to get pregnant Breast-feeding How should I use this medication? This medication is for injection into a vein. It is given in a hospital or clinic setting. Talk to your care team the use of this medication in children. Special care may be needed. Overdosage: If you think you have taken too much of this medicine contact a poison control center or emergency room at once. NOTE: This medicine is only for you. Do not share this medicine with others. What if I miss a dose? It is important not to miss your dose. Call your care team if you are unable to keep an appointment. What may interact with this medication? Other iron products This list may not describe all possible interactions. Give your health care provider a list of all the medicines, herbs, non-prescription drugs, or dietary supplements you use. Also tell them if you smoke, drink alcohol, or use illegal drugs. Some items may interact with your medicine. What should I watch for while using this medication? Visit your care team regularly. Tell your care team if your symptoms do not start to get better or if they get worse. You may need blood work done while you are taking this  medication. You may need to follow a special diet. Talk to your care team. Foods that contain iron include: whole grains/cereals, dried fruits, beans, or peas, leafy green vegetables, and organ meats (liver, kidney). What side effects may I notice from receiving this medication? Side effects that you should report to your care team as soon as possible: Allergic reactions-skin rash, itching, hives, swelling of the face, lips, tongue, or throat Low blood pressure-dizziness, feeling faint or lightheaded, blurry vision Shortness of breath Side effects that usually do not require medical attention (report to your care team if they continue or are bothersome): Flushing Headache Joint pain Muscle pain Nausea Pain, redness, or irritation at injection site This list may not describe all possible side effects. Call your doctor for medical advice about side effects. You may report side effects to FDA at 1-800-FDA-1088. Where should I keep my medication? This medication is given in a hospital or clinic and will not be stored at home. NOTE: This sheet is a summary. It may not cover all possible information. If you have questions about this medicine, talk to your doctor, pharmacist, or health care provider.  2022 Elsevier/Gold Standard (2020-10-05 15:35:12)  

## 2021-03-11 ENCOUNTER — Telehealth: Payer: Self-pay | Admitting: Cardiology

## 2021-03-11 ENCOUNTER — Inpatient Hospital Stay: Payer: Medicare Other

## 2021-03-11 ENCOUNTER — Other Ambulatory Visit: Payer: Self-pay

## 2021-03-11 VITALS — BP 138/59 | HR 68 | Temp 98.2°F | Resp 18 | Ht 60.0 in | Wt 115.0 lb

## 2021-03-11 DIAGNOSIS — D509 Iron deficiency anemia, unspecified: Secondary | ICD-10-CM

## 2021-03-11 DIAGNOSIS — D473 Essential (hemorrhagic) thrombocythemia: Secondary | ICD-10-CM | POA: Diagnosis not present

## 2021-03-11 MED ORDER — SODIUM CHLORIDE 0.9 % IV SOLN
510.0000 mg | Freq: Once | INTRAVENOUS | Status: AC
  Administered 2021-03-11: 510 mg via INTRAVENOUS
  Filled 2021-03-11: qty 17

## 2021-03-11 MED ORDER — SODIUM CHLORIDE 0.9 % IV SOLN: Freq: Once | INTRAVENOUS | Status: AC

## 2021-03-11 NOTE — Telephone Encounter (Signed)
Pt c/o medication issue:  1. Name of Medication: clopidogrel (PLAVIX) 75 MG tablet   2. How are you currently taking this medication (dosage and times per day)? Take 75 mg by mouth daily in the afternoon.  3. Are you having a reaction (difficulty breathing--STAT)? no  4. What is your medication issue? Pt would like to know if she is to continue taking this medication please advise

## 2021-03-11 NOTE — Patient Instructions (Signed)
Ferumoxytol Injection What is this medication? FERUMOXYTOL (FER ue MOX i tol) treats low levels of iron in your body (iron deficiency anemia). Iron is a mineral that plays an important role in making red blood cells, which carry oxygen from your lungs to the rest of your body. This medicine may be used for other purposes; ask your health care provider or pharmacist if you have questions. COMMON BRAND NAME(S): Feraheme What should I tell my care team before I take this medication? They need to know if you have any of these conditions: Anemia not caused by low iron levels High levels of iron in the blood Magnetic resonance imaging (MRI) test scheduled An unusual or allergic reaction to iron, other medications, foods, dyes, or preservatives Pregnant or trying to get pregnant Breast-feeding How should I use this medication? This medication is for injection into a vein. It is given in a hospital or clinic setting. Talk to your care team the use of this medication in children. Special care may be needed. Overdosage: If you think you have taken too much of this medicine contact a poison control center or emergency room at once. NOTE: This medicine is only for you. Do not share this medicine with others. What if I miss a dose? It is important not to miss your dose. Call your care team if you are unable to keep an appointment. What may interact with this medication? Other iron products This list may not describe all possible interactions. Give your health care provider a list of all the medicines, herbs, non-prescription drugs, or dietary supplements you use. Also tell them if you smoke, drink alcohol, or use illegal drugs. Some items may interact with your medicine. What should I watch for while using this medication? Visit your care team regularly. Tell your care team if your symptoms do not start to get better or if they get worse. You may need blood work done while you are taking this  medication. You may need to follow a special diet. Talk to your care team. Foods that contain iron include: whole grains/cereals, dried fruits, beans, or peas, leafy green vegetables, and organ meats (liver, kidney). What side effects may I notice from receiving this medication? Side effects that you should report to your care team as soon as possible: Allergic reactions-skin rash, itching, hives, swelling of the face, lips, tongue, or throat Low blood pressure-dizziness, feeling faint or lightheaded, blurry vision Shortness of breath Side effects that usually do not require medical attention (report to your care team if they continue or are bothersome): Flushing Headache Joint pain Muscle pain Nausea Pain, redness, or irritation at injection site This list may not describe all possible side effects. Call your doctor for medical advice about side effects. You may report side effects to FDA at 1-800-FDA-1088. Where should I keep my medication? This medication is given in a hospital or clinic and will not be stored at home. NOTE: This sheet is a summary. It may not cover all possible information. If you have questions about this medicine, talk to your doctor, pharmacist, or health care provider.  2022 Elsevier/Gold Standard (2020-10-05 15:35:12)  

## 2021-03-11 NOTE — Telephone Encounter (Signed)
Patient wants to know if Dr. Agustin Cree wants her to continue plavix. If so she would like him to be the one to fill it. Will consult with him.

## 2021-03-12 NOTE — Telephone Encounter (Signed)
Called patient informed her that this needs to be answered by hematology or neurology. She understood.

## 2021-03-15 ENCOUNTER — Encounter: Payer: Self-pay | Admitting: *Deleted

## 2021-04-12 DIAGNOSIS — E039 Hypothyroidism, unspecified: Secondary | ICD-10-CM | POA: Diagnosis not present

## 2021-04-12 DIAGNOSIS — G4700 Insomnia, unspecified: Secondary | ICD-10-CM | POA: Diagnosis not present

## 2021-04-12 DIAGNOSIS — M79671 Pain in right foot: Secondary | ICD-10-CM | POA: Diagnosis not present

## 2021-04-12 DIAGNOSIS — F419 Anxiety disorder, unspecified: Secondary | ICD-10-CM | POA: Diagnosis not present

## 2021-04-12 DIAGNOSIS — E871 Hypo-osmolality and hyponatremia: Secondary | ICD-10-CM | POA: Diagnosis not present

## 2021-04-12 DIAGNOSIS — D473 Essential (hemorrhagic) thrombocythemia: Secondary | ICD-10-CM | POA: Diagnosis not present

## 2021-04-12 DIAGNOSIS — K589 Irritable bowel syndrome without diarrhea: Secondary | ICD-10-CM | POA: Diagnosis not present

## 2021-04-12 DIAGNOSIS — R531 Weakness: Secondary | ICD-10-CM | POA: Diagnosis not present

## 2021-04-12 DIAGNOSIS — Z23 Encounter for immunization: Secondary | ICD-10-CM | POA: Diagnosis not present

## 2021-04-12 DIAGNOSIS — I10 Essential (primary) hypertension: Secondary | ICD-10-CM | POA: Diagnosis not present

## 2021-04-12 DIAGNOSIS — Z6821 Body mass index (BMI) 21.0-21.9, adult: Secondary | ICD-10-CM | POA: Diagnosis not present

## 2021-04-12 DIAGNOSIS — R634 Abnormal weight loss: Secondary | ICD-10-CM | POA: Diagnosis not present

## 2021-04-17 NOTE — Progress Notes (Signed)
[facility]  [address] [place],  [place]  [address] [phone]  Clinic Day:  04/24/2021  Referring physician: Lowella Dandy, NP  This document serves as a record of services personally performed by Jordan Potter, MD. It was created on their behalf by Curry,Lauren E, a trained medical scribe. The creation of this record is based on the scribe's personal observations and the provider's statements to them.  HISTORY OF PRESENT ILLNESS:  The patient is a 75 y.o. female with essential thrombocythemia.  Due to the rise in her platelets, the patient is back to taking hydroxyurea to 500 mg twice daily.  Furthermore, since her last visit, the patient was given IV iron as her iron levels were borderline low.  She comes in today to reassess her platelets.  Since her last visit, the patient has been doing well.  She denies having any underlying clotting complications from her essential thrombocythemia.  Overall, she feels much better since her IV iron was given.   PHYSICAL EXAM:  Blood pressure (!) 148/71, pulse 63, temperature 98.3 F (36.8 C), resp. rate 16, height 5' (1.524 m), weight 118 lb 9.6 oz (53.8 kg), SpO2 100 %. Wt Readings from Last 3 Encounters:  04/24/21 118 lb 9.6 oz (53.8 kg)  03/11/21 115 lb (52.2 kg)  03/07/21 117 lb 12 oz (53.4 kg)   Body mass index is 23.16 kg/m. Performance status (ECOG): 1 Physical Exam Constitutional:      Appearance: Normal appearance. She is not ill-appearing.  HENT:     Mouth/Throat:     Mouth: Mucous membranes are moist.     Pharynx: Oropharynx is clear. No oropharyngeal exudate or posterior oropharyngeal erythema.  Cardiovascular:     Rate and Rhythm: Normal rate and regular rhythm.     Heart sounds: No murmur heard.   No friction rub. No gallop.  Pulmonary:     Effort: Pulmonary effort is normal. No respiratory distress.     Breath sounds: Normal breath sounds. No wheezing, rhonchi or rales.  Abdominal:      General: Bowel sounds are normal. There is no distension.     Palpations: Abdomen is soft. There is no mass.     Tenderness: There is no abdominal tenderness.  Musculoskeletal:        General: No swelling.     Right lower leg: No edema.     Left lower leg: No edema.  Lymphadenopathy:     Cervical: No cervical adenopathy.     Upper Body:     Right upper body: No supraclavicular or axillary adenopathy.     Left upper body: No supraclavicular or axillary adenopathy.     Lower Body: No right inguinal adenopathy. No left inguinal adenopathy.  Skin:    General: Skin is warm.     Coloration: Skin is not jaundiced.     Findings: No lesion or rash.  Neurological:     General: No focal deficit present.     Mental Status: She is alert and oriented to person, place, and time. Mental status is at baseline.  Psychiatric:        Mood and Affect: Mood normal.        Behavior: Behavior normal.        Thought Content: Thought content normal.    LABS:   CBC Latest Ref Rng & Units 04/24/2021 02/22/2021 01/25/2021  WBC - 8.2 12.8 13.8  Hemoglobin 12.0 - 16.0 11.4(A) 11.0(A) 12.5  Hematocrit  36 - 46 33(A) 33(A) 36  Platelets 150 - 399 376 885(A) 704(A)   CMP Latest Ref Rng & Units 02/25/2021 01/10/2021 01/01/2021  Glucose 70 - 99 mg/dL 124(H) 110(H) 105(H)  BUN 8 - 27 mg/dL 13 12 13   Creatinine 0.57 - 1.00 mg/dL 0.84 0.93 0.80  Sodium 134 - 144 mmol/L 129(L) 131(L) 130(L)  Potassium 3.5 - 5.2 mmol/L 4.9 4.5 3.3(L)  Chloride 96 - 106 mmol/L 90(L) 94(L) 89(L)  CO2 20 - 29 mmol/L 23 24 27   Calcium 8.7 - 10.3 mg/dL 9.2 9.4 9.7  Total Protein 6.0 - 8.3 g/dL - - -  Total Bilirubin 0.3 - 1.2 mg/dL - - -  Alkaline Phos 25 - 125 - - -  AST 13 - 35 - - -  ALT 7 - 35 - - -    Latest Reference Range & Units 04/24/21 09:47  Iron 28 - 170 ug/dL 106  UIBC ug/dL 136  TIBC 250 - 450 ug/dL 242 (L)  Saturation Ratios 10.4 - 31.8 % 44 (H)  Ferritin 11 - 307 ng/mL 269   ASSESSMENT & PLAN:   Assessment/Plan:  A 75 y.o. female with essential thrombocythemia.  I am very pleased with her labs today as her platelets have fallen back to normal levels.  This is likely related to both her taking hydroxyurea at 500 mg twice daily, as well as recently receiving IV iron.  Although she has documented essential thrombocythemia, she understands that being iron deficient can also cause a reactive thrombocythemia.  Clinically, the patient is doing well.  I will see her back in 4 months for repeat clinical assessment.   The patient understands all the plans discussed today and is in agreement with them.    I, Rita Ohara, am acting as scribe for Jordan Potter, MD    I have reviewed this report as typed by the medical scribe, and it is complete and accurate.  Tramel Westbrook Macarthur Critchley, MD

## 2021-04-23 ENCOUNTER — Other Ambulatory Visit: Payer: Self-pay | Admitting: Oncology

## 2021-04-23 DIAGNOSIS — D473 Essential (hemorrhagic) thrombocythemia: Secondary | ICD-10-CM

## 2021-04-24 ENCOUNTER — Telehealth: Payer: Self-pay | Admitting: Oncology

## 2021-04-24 ENCOUNTER — Other Ambulatory Visit: Payer: Self-pay | Admitting: Oncology

## 2021-04-24 ENCOUNTER — Other Ambulatory Visit: Payer: Self-pay

## 2021-04-24 ENCOUNTER — Inpatient Hospital Stay: Payer: Medicare Other | Attending: Oncology

## 2021-04-24 ENCOUNTER — Other Ambulatory Visit: Payer: Self-pay | Admitting: Hematology and Oncology

## 2021-04-24 ENCOUNTER — Inpatient Hospital Stay (INDEPENDENT_AMBULATORY_CARE_PROVIDER_SITE_OTHER): Payer: Medicare Other | Admitting: Oncology

## 2021-04-24 VITALS — BP 148/71 | HR 63 | Temp 98.3°F | Resp 16 | Ht 60.0 in | Wt 118.6 lb

## 2021-04-24 DIAGNOSIS — D473 Essential (hemorrhagic) thrombocythemia: Secondary | ICD-10-CM | POA: Insufficient documentation

## 2021-04-24 DIAGNOSIS — D508 Other iron deficiency anemias: Secondary | ICD-10-CM | POA: Diagnosis not present

## 2021-04-24 LAB — FERRITIN: Ferritin: 269 ng/mL (ref 11–307)

## 2021-04-24 LAB — CBC AND DIFFERENTIAL
HCT: 33 — AB (ref 36–46)
Hemoglobin: 11.4 — AB (ref 12.0–16.0)
Neutrophils Absolute: 6.81
Platelets: 376 (ref 150–399)
WBC: 8.2

## 2021-04-24 LAB — IRON AND TIBC
Iron: 106 ug/dL (ref 28–170)
Saturation Ratios: 44 % — ABNORMAL HIGH (ref 10.4–31.8)
TIBC: 242 ug/dL — ABNORMAL LOW (ref 250–450)
UIBC: 136 ug/dL

## 2021-04-24 LAB — CBC: RBC: 2.72 — AB (ref 3.87–5.11)

## 2021-04-24 NOTE — Telephone Encounter (Signed)
Per 11/23 los next appt scheduled and confirmed with patient

## 2021-06-07 DIAGNOSIS — D473 Essential (hemorrhagic) thrombocythemia: Secondary | ICD-10-CM | POA: Diagnosis not present

## 2021-06-07 DIAGNOSIS — R531 Weakness: Secondary | ICD-10-CM | POA: Diagnosis not present

## 2021-06-07 DIAGNOSIS — I10 Essential (primary) hypertension: Secondary | ICD-10-CM | POA: Diagnosis not present

## 2021-06-07 DIAGNOSIS — E039 Hypothyroidism, unspecified: Secondary | ICD-10-CM | POA: Diagnosis not present

## 2021-06-07 DIAGNOSIS — Z6821 Body mass index (BMI) 21.0-21.9, adult: Secondary | ICD-10-CM | POA: Diagnosis not present

## 2021-06-07 DIAGNOSIS — G4700 Insomnia, unspecified: Secondary | ICD-10-CM | POA: Diagnosis not present

## 2021-06-07 DIAGNOSIS — M5136 Other intervertebral disc degeneration, lumbar region: Secondary | ICD-10-CM | POA: Diagnosis not present

## 2021-06-07 DIAGNOSIS — F419 Anxiety disorder, unspecified: Secondary | ICD-10-CM | POA: Diagnosis not present

## 2021-06-07 DIAGNOSIS — J309 Allergic rhinitis, unspecified: Secondary | ICD-10-CM | POA: Diagnosis not present

## 2021-06-07 DIAGNOSIS — D509 Iron deficiency anemia, unspecified: Secondary | ICD-10-CM | POA: Diagnosis not present

## 2021-06-07 DIAGNOSIS — K589 Irritable bowel syndrome without diarrhea: Secondary | ICD-10-CM | POA: Diagnosis not present

## 2021-06-07 DIAGNOSIS — R634 Abnormal weight loss: Secondary | ICD-10-CM | POA: Diagnosis not present

## 2021-06-07 DIAGNOSIS — E871 Hypo-osmolality and hyponatremia: Secondary | ICD-10-CM | POA: Diagnosis not present

## 2021-06-27 ENCOUNTER — Ambulatory Visit: Payer: Medicare Other | Admitting: Cardiology

## 2021-07-01 ENCOUNTER — Other Ambulatory Visit: Payer: Self-pay

## 2021-07-01 ENCOUNTER — Ambulatory Visit (INDEPENDENT_AMBULATORY_CARE_PROVIDER_SITE_OTHER): Payer: Medicare Other | Admitting: Cardiology

## 2021-07-01 VITALS — BP 130/64 | HR 58 | Ht 60.0 in | Wt 117.8 lb

## 2021-07-01 DIAGNOSIS — I10 Essential (primary) hypertension: Secondary | ICD-10-CM | POA: Diagnosis not present

## 2021-07-01 DIAGNOSIS — G459 Transient cerebral ischemic attack, unspecified: Secondary | ICD-10-CM

## 2021-07-01 DIAGNOSIS — E785 Hyperlipidemia, unspecified: Secondary | ICD-10-CM

## 2021-07-01 NOTE — Progress Notes (Signed)
Cardiology Office Note:    Date:  07/01/2021   ID:  Jordan Higgins, DOB February 13, 1946, MRN [id number]  PCP:  Jordan Dandy, NP  Cardiologist:  Jordan Campus, MD    Referring MD: Jordan Dandy, NP   Chief Complaint  Patient presents with   Follow-up  My feet are painful  History of Present Illness:    Jordan Higgins is a 76 y.o. female  who was recently admitted with episode of what appears to be TIA.  Quite extensive evaluation has been performed after that she did have angiogram of her artery in her neck as well as echocardiogram and then eventually TEE trying to find a cardiac source of emboli.  She also had Zio patch for 2 weeks which showed on supraventricular tachycardia as well as APCs but no atrial fibrillation.  She is coming today 2 months for follow-up.  She also got thrombocytosis and also she apparently skipped some medication she did take medication the right leg.  She had recently visit with her oncologist/hematologist Dr. Cassandria Higgins who told her that that may be what is responsible for his CVA.   Comes today 2 months of follow-up.  No TIA CVA-like symptoms.  Denies have any chest pain tightness squeezing pressure burning chest she complain of having cold feet as well as pain in her feet.  It does not matter what she does just painful when she walks it is painful when she lay down it is painful and cold.  Past Medical History:  Diagnosis Date   Anxiety    Chronic diastolic heart failure ([place]) 01/18/2016   Chronic lower back pain    Essential thrombocythemia ([place])    GERD (gastroesophageal reflux disease)    Headache(784.0) 01/16/2012   "all day; think its related to TIA"   High cholesterol    Hypertension    Hyponatremia 01/18/2016   Hypothyroidism    PONV (postoperative nausea and vomiting)    "violently; only thing that works for her is scopolamine patch "   Stroke ([place]) 12/20/2020   TIA (transient ischemic attack) 01/16/2012    Past Surgical History:   Procedure Laterality Date   BACK SURGERY     BLADDER SUSPENSION  1990's   "w/rectal suspensiion"   BUBBLE STUDY  01/07/2021   Procedure: BUBBLE STUDY;  Surgeon: Jordan Munroe, MD;  Location: [facility] ENDOSCOPY;  Service: Cardiology;;   CHOLECYSTECTOMY  ~ 2010   [place]  11/15/2011; early 12/2011   "abscess; S/P back OR"; "staph infection in wound"   [place]  11/06/2011   "shaved down 2 bulging discs"   [place]  ~ 12/07/2011   right upper arm   TEE WITHOUT CARDIOVERSION N/A 01/07/2021   Procedure: TRANSESOPHAGEAL ECHOCARDIOGRAM (TEE);  Surgeon: Jordan Munroe, MD;  Location: [place];  Service: Cardiology;  Laterality: N/A;    Current Medications: Current Meds  Medication Sig   aspirin EC 81 MG tablet Take 81 mg by mouth in the morning. Swallow whole.   busPIRone (BUSPAR) 5 MG tablet Take 5 mg by mouth 3 (three) times daily as needed (nerves).   fluticasone (FLONASE) 50 MCG/ACT nasal spray Place 1 spray into both nostrils daily as needed for allergies or rhinitis (sinus pressure/allergies).   hydroxyurea (HYDREA) 500 MG capsule Take 1 capsule (500 mg total) by mouth 2 (two) times daily. (Patient taking differently: Take 500 mg by mouth every evening.)   hyoscyamine (ANASPAZ) 0.125  MG TBDP disintergrating tablet Place 0.0625-0.125 mg under the tongue as needed for bladder spasms or cramping.   ibuprofen (ADVIL) 200 MG tablet Take 200-400 mg by mouth every 8 (eight) hours as needed for mild pain or moderate pain (pain.).   levothyroxine (SYNTHROID, LEVOTHROID) 88 MCG tablet Take 88 mcg by mouth See admin instructions. Take 1 tablet (88 mcg) by mouth in the morning every day EXCEPT on Sunday.   loratadine (CLARITIN) 10 MG tablet Take 10 mg by mouth daily as needed for allergies (allergies/sinus issues.).   metoprolol tartrate (LOPRESSOR) 25 MG tablet Take 0.5 tablets (12.5 mg total) by mouth 2 (two) times daily.    Polyethyl Glycol-Propyl Glycol (SYSTANE) 0.4-0.3 % SOLN Place 1-2 drops into both eyes 3 (three) times daily as needed (dry/irritated eyes.).   sucralfate (CARAFATE) 1 g tablet Take 1 g by mouth at bedtime.   traMADol-acetaminophen (ULTRACET) 37.5-325 MG tablet Take 1 tablet by mouth 2 (two) times daily as needed for moderate pain or severe pain (chronic pain.).   valsartan-hydrochlorothiazide (DIOVAN-HCT) 80-12.5 MG tablet Take 1 tablet by mouth in the morning.   zolpidem (AMBIEN) 10 MG tablet Take 10 mg by mouth at bedtime as needed for sleep.     Allergies:   Patient has no known allergies.   Social History   Socioeconomic History   Marital status: Married    Spouse name: Not on file   Number of children: Not on file   Years of education: Not on file   Highest education level: Not on file  Occupational History   Not on file  Tobacco Use   Smoking status: Never   Smokeless tobacco: Never  Substance and Sexual Activity   Alcohol use: No    Alcohol/week: 0.0 standard drinks   Drug use: No   Sexual activity: Not on file  Other Topics Concern   Not on file  Social History Narrative   Not on file   Social Determinants of Health   Financial Resource Strain: Not on file  Food Insecurity: Not on file  Transportation Needs: Not on file  Physical Activity: Not on file  Stress: Not on file  Social Connections: Not on file     Family History: The patient's family history includes Heart attack in her brother. ROS:   Please see the history of present illness.    All 14 point review of systems negative except as described per history of present illness  EKGs/Labs/Other Studies Reviewed:      Recent Labs: 09/14/2020: ALT 19 02/25/2021: BUN 13; Creatinine, Ser 0.84; Potassium 4.9; Sodium 129 04/24/2021: Hemoglobin 11.4; Platelets 376  Recent Lipid Panel    Component Value Date/Time   CHOL 138 01/17/2012 0403   TRIG 135 01/17/2012 0403   HDL 44 01/17/2012 0403   CHOLHDL  3.1 01/17/2012 0403   VLDL 27 01/17/2012 0403   LDLCALC 67 01/17/2012 0403    Physical Exam:    VS:  BP 130/64 (BP Location: Right Arm, Patient Position: Sitting)    Pulse (!) 58    Ht 5' (1.524 m)    Wt 117 lb 12.8 oz (53.4 kg)    SpO2 95%    BMI 23.01 kg/m     Wt Readings from Last 3 Encounters:  07/01/21 117 lb 12.8 oz (53.4 kg)  04/24/21 118 lb 9.6 oz (53.8 kg)  03/11/21 115 lb (52.2 kg)     GEN:  Well nourished, well developed in no acute distress HEENT: Normal NECK: No  JVD; No carotid bruits LYMPHATICS: No lymphadenopathy CARDIAC: RRR, no murmurs, no rubs, no gallops RESPIRATORY:  Clear to auscultation without rales, wheezing or rhonchi  ABDOMEN: Soft, non-tender, non-distended MUSCULOSKELETAL:  No edema; No deformity  SKIN: Warm and dry LOWER EXTREMITIES: no swelling NEUROLOGIC:  Alert and oriented x 3 PSYCHIATRIC:  Normal affect   ASSESSMENT:    1. TIA (transient ischemic attack)   2. Primary hypertension   3. Dyslipidemia    PLAN:    In order of problems listed above:  History of TIA I think we do have explanation for her TIA.  Therefore echocardiogram being done did not show any intracardiac shunt, monitor did not show any significant arrhythmia.  I think can drop a cardiac evaluation for potential cardiac source of TIA. Essential hypertension blood pressure seems to be well controlled continue present management. Leg pain I do feel her dorsalis pedis arteries but very weak I will ask you to do segmental pressures.  Also told her to discontinue beta-blocker sometimes it gives people cold sensation in the feet. Dyslipidemia I did review her K PN which show me her LDL of 135 HDL 71 this is from August 2021.  She is ready to see her primary care physician she will have another fasting lipid profile done.  We will wait for results of it and see if anything needs to be done however she did have intolerance to multiple cholesterol medications.   Medication  Adjustments/Labs and Tests Ordered: Current medicines are reviewed at length with the patient today.  Concerns regarding medicines are outlined above.  No orders of the defined types were placed in this encounter.  Medication changes: No orders of the defined types were placed in this encounter.   Signed, Park Liter, MD, [facility] 07/01/2021 12:14 PM    [facility]

## 2021-07-01 NOTE — Patient Instructions (Signed)
Medication Instructions:  Your physician has recommended you make the following change in your medication:   STOP Metoprolol  *If you need a refill on your cardiac medications before your next appointment, please call your pharmacy*   Lab Work: None If you have labs (blood work) drawn today and your tests are completely normal, you will receive your results only by: [place] (if you have MyChart) OR A paper copy in the mail If you have any lab test that is abnormal or we need to change your treatment, we will call you to review the results.   Testing/Procedures: Segmental Blood Pressure Scan   Follow-Up: At [facility], you and your health needs are our priority.  As part of our continuing mission to provide you with exceptional heart care, we have created designated Provider Care Teams.  These Care Teams include your primary Cardiologist (physician) and Advanced Practice Providers (APPs -  Physician Assistants and Nurse Practitioners) who all work together to provide you with the care you need, when you need it.  We recommend signing up for the patient portal called "MyChart".  Sign up information is provided on this After Visit Summary.  MyChart is used to connect with patients for Virtual Visits (Telemedicine).  Patients are able to view lab/test results, encounter notes, upcoming appointments, etc.  Non-urgent messages can be sent to your provider as well.   To learn more about what you can do with MyChart, go to [email or web address].    Your next appointment:   6 month(s)  The format for your next appointment:   In Person  Provider:   Jenne Campus, MD    Other Instructions None

## 2021-07-02 DIAGNOSIS — I10 Essential (primary) hypertension: Secondary | ICD-10-CM | POA: Diagnosis not present

## 2021-07-02 DIAGNOSIS — I5032 Chronic diastolic (congestive) heart failure: Secondary | ICD-10-CM | POA: Diagnosis not present

## 2021-07-08 ENCOUNTER — Other Ambulatory Visit: Payer: Self-pay | Admitting: Hematology and Oncology

## 2021-07-08 DIAGNOSIS — D473 Essential (hemorrhagic) thrombocythemia: Secondary | ICD-10-CM

## 2021-07-08 MED ORDER — HYDROXYUREA 500 MG PO CAPS: 500.0000 mg | ORAL_CAPSULE | Freq: Two times a day (BID) | ORAL | 1 refills | Status: DC

## 2021-07-15 ENCOUNTER — Ambulatory Visit (INDEPENDENT_AMBULATORY_CARE_PROVIDER_SITE_OTHER): Payer: Medicare Other

## 2021-07-15 ENCOUNTER — Other Ambulatory Visit: Payer: Self-pay

## 2021-07-15 ENCOUNTER — Other Ambulatory Visit: Payer: Self-pay | Admitting: Cardiology

## 2021-07-15 DIAGNOSIS — M79605 Pain in left leg: Secondary | ICD-10-CM

## 2021-07-15 DIAGNOSIS — M79604 Pain in right leg: Secondary | ICD-10-CM

## 2021-07-15 DIAGNOSIS — G459 Transient cerebral ischemic attack, unspecified: Secondary | ICD-10-CM | POA: Diagnosis not present

## 2021-07-15 DIAGNOSIS — I10 Essential (primary) hypertension: Secondary | ICD-10-CM

## 2021-07-15 DIAGNOSIS — E785 Hyperlipidemia, unspecified: Secondary | ICD-10-CM

## 2021-07-18 DIAGNOSIS — H35373 Puckering of macula, bilateral: Secondary | ICD-10-CM | POA: Diagnosis not present

## 2021-07-18 DIAGNOSIS — H25812 Combined forms of age-related cataract, left eye: Secondary | ICD-10-CM | POA: Diagnosis not present

## 2021-07-19 ENCOUNTER — Telehealth: Payer: Self-pay | Admitting: Cardiology

## 2021-07-19 DIAGNOSIS — G459 Transient cerebral ischemic attack, unspecified: Secondary | ICD-10-CM

## 2021-07-19 NOTE — Telephone Encounter (Signed)
Patient is requesting to go over ABI W/WO TBI results.

## 2021-07-22 NOTE — Telephone Encounter (Signed)
Tried calling patient. No answer and no voicemail set up for me to leave a message. 

## 2021-07-23 NOTE — Telephone Encounter (Signed)
Spoke to the patient just now and let her know Dr. Wendy Poet recommendations. She is agreeable and the order for the test has been placed. Scheduling will call to get this scheduled for her.    Encouraged patient to call back with any questions or concerns.

## 2021-07-23 NOTE — Addendum Note (Signed)
Addended by: Resa Miner I on: 07/23/2021 08:08 AM   Modules accepted: Orders

## 2021-07-31 ENCOUNTER — Ambulatory Visit (INDEPENDENT_AMBULATORY_CARE_PROVIDER_SITE_OTHER): Payer: Medicare Other

## 2021-07-31 ENCOUNTER — Other Ambulatory Visit: Payer: Self-pay

## 2021-07-31 DIAGNOSIS — G458 Other transient cerebral ischemic attacks and related syndromes: Secondary | ICD-10-CM | POA: Diagnosis not present

## 2021-07-31 DIAGNOSIS — G459 Transient cerebral ischemic attack, unspecified: Secondary | ICD-10-CM

## 2021-08-05 DIAGNOSIS — I10 Essential (primary) hypertension: Secondary | ICD-10-CM | POA: Diagnosis not present

## 2021-08-05 DIAGNOSIS — D509 Iron deficiency anemia, unspecified: Secondary | ICD-10-CM | POA: Diagnosis not present

## 2021-08-05 DIAGNOSIS — Z6821 Body mass index (BMI) 21.0-21.9, adult: Secondary | ICD-10-CM | POA: Diagnosis not present

## 2021-08-05 DIAGNOSIS — E039 Hypothyroidism, unspecified: Secondary | ICD-10-CM | POA: Diagnosis not present

## 2021-08-05 DIAGNOSIS — R600 Localized edema: Secondary | ICD-10-CM | POA: Diagnosis not present

## 2021-08-05 DIAGNOSIS — R531 Weakness: Secondary | ICD-10-CM | POA: Diagnosis not present

## 2021-08-05 DIAGNOSIS — I5032 Chronic diastolic (congestive) heart failure: Secondary | ICD-10-CM | POA: Diagnosis not present

## 2021-08-05 DIAGNOSIS — E785 Hyperlipidemia, unspecified: Secondary | ICD-10-CM | POA: Diagnosis not present

## 2021-08-05 DIAGNOSIS — I471 Supraventricular tachycardia: Secondary | ICD-10-CM | POA: Diagnosis not present

## 2021-08-05 DIAGNOSIS — D473 Essential (hemorrhagic) thrombocythemia: Secondary | ICD-10-CM | POA: Diagnosis not present

## 2021-08-22 NOTE — Progress Notes (Signed)
?[facility]  ?[address] ?[place],  [place]  [address] ?[phone] ? ?Clinic Day:  08/23/2021 ? ?Referring physician: Lowella Dandy, NP ? ?HISTORY OF PRESENT ILLNESS:  ?The patient is a 76 y.o. female with essential thrombocythemia.  Due to the rise in her platelets, the patient is back to taking hydroxyurea to 500 mg twice daily.  Of note, the patient also has a history of iron deficiency anemia, for which IV iron was effective in replenishing her iron stores and better normalizing her platelet count.  She comes in today to reassess her peripheral counts.  Since her last visit, the patient has been doing well.  She continues to deny having any clotting complications from her underlying essential thrombocythemia. ? ?PHYSICAL EXAM:  ?Blood pressure 120/64, pulse 89, temperature 98.4 ?F (36.9 ?C), resp. rate 14, height 5' (1.524 m), weight 120 lb 1.6 oz (54.5 kg), SpO2 98 %. ?Wt Readings from Last 3 Encounters:  ?08/23/21 120 lb 1.6 oz (54.5 kg)  ?07/01/21 117 lb 12.8 oz (53.4 kg)  ?04/24/21 118 lb 9.6 oz (53.8 kg)  ? ?Body mass index is 23.46 kg/m?Marland Kitchen ?Performance status (ECOG): 1 ?Physical Exam ?Constitutional:   ?   Appearance: Normal appearance. She is not ill-appearing.  ?HENT:  ?   Mouth/Throat:  ?   Mouth: Mucous membranes are moist.  ?   Pharynx: Oropharynx is clear. No oropharyngeal exudate or posterior oropharyngeal erythema.  ?Cardiovascular:  ?   Rate and Rhythm: Normal rate and regular rhythm.  ?   Heart sounds: No murmur heard. ?  No friction rub. No gallop.  ?Pulmonary:  ?   Effort: Pulmonary effort is normal. No respiratory distress.  ?   Breath sounds: Normal breath sounds. No wheezing, rhonchi or rales.  ?Abdominal:  ?   General: Bowel sounds are normal. There is no distension.  ?   Palpations: Abdomen is soft. There is no mass.  ?   Tenderness: There is no abdominal tenderness.  ?Musculoskeletal:     ?   General: No swelling.  ?   Right lower leg: No edema.  ?    Left lower leg: No edema.  ?Lymphadenopathy:  ?   Cervical: No cervical adenopathy.  ?   Upper Body:  ?   Right upper body: No supraclavicular or axillary adenopathy.  ?   Left upper body: No supraclavicular or axillary adenopathy.  ?   Lower Body: No right inguinal adenopathy. No left inguinal adenopathy.  ?Skin: ?   General: Skin is warm.  ?   Coloration: Skin is not jaundiced.  ?   Findings: No lesion or rash.  ?Neurological:  ?   General: No focal deficit present.  ?   Mental Status: She is alert and oriented to person, place, and time. Mental status is at baseline.  ?Psychiatric:     ?   Mood and Affect: Mood normal.     ?   Behavior: Behavior normal.     ?   Thought Content: Thought content normal.  ? ? ?LABS:  ? ? ?  Latest Ref Rng & Units 08/23/2021  ? 12:00 AM 04/24/2021  ? 12:00 AM 02/22/2021  ? 12:00 AM  ?CBC  ?WBC  8.1      8.2      12.8       ?Hemoglobin 12.0 - 16.0 11.1      11.4      11.0       ?  Hematocrit 36 - 46 33      33      33       ?Platelets 150 - 400 K/uL 592      376      885       ?  ? This result is from an external source.  ? ? Latest Reference Range & Units 08/23/21 10:07  ?Iron 28 - 170 ug/dL 102  ?UIBC ug/dL 185  ?TIBC 250 - 450 ug/dL 287  ?Saturation Ratios 10.4 - 31.8 % 36 (H)  ?Ferritin 11 - 307 ng/mL 121  ? ? ?ASSESSMENT & PLAN:  ?Assessment/Plan:  A 76 y.o. female with essential thrombocythemia.  Although higher than at her last visit, I am pleased as her platelet count remains less than 600.  Based upon this, she will continue to take hydroxyurea 500 mg twice daily.  She also knows to continue taking a baby aspirin on a daily basis to offset any potential clotting complications her underlying myeloproliferative disorder can cause.  Clinically, the patient is doing fairly well.  I will see her back in 4 months for repeat clinical assessment.  The patient understands all the plans discussed today and is in agreement with them.   ? ?Samreen Seltzer Macarthur Critchley, MD ?  ? ? ?  ?

## 2021-08-23 ENCOUNTER — Inpatient Hospital Stay: Payer: Medicare Other

## 2021-08-23 ENCOUNTER — Other Ambulatory Visit: Payer: Self-pay | Admitting: Oncology

## 2021-08-23 ENCOUNTER — Inpatient Hospital Stay: Payer: Medicare Other | Attending: Oncology | Admitting: Oncology

## 2021-08-23 ENCOUNTER — Telehealth: Payer: Self-pay

## 2021-08-23 ENCOUNTER — Telehealth: Payer: Self-pay | Admitting: Oncology

## 2021-08-23 ENCOUNTER — Other Ambulatory Visit: Payer: Self-pay

## 2021-08-23 VITALS — BP 120/64 | HR 89 | Temp 98.4°F | Resp 14 | Ht 60.0 in | Wt 120.1 lb

## 2021-08-23 DIAGNOSIS — D473 Essential (hemorrhagic) thrombocythemia: Secondary | ICD-10-CM

## 2021-08-23 DIAGNOSIS — D75839 Thrombocytosis, unspecified: Secondary | ICD-10-CM | POA: Insufficient documentation

## 2021-08-23 DIAGNOSIS — D509 Iron deficiency anemia, unspecified: Secondary | ICD-10-CM | POA: Insufficient documentation

## 2021-08-23 LAB — FERRITIN: Ferritin: 121 ng/mL (ref 11–307)

## 2021-08-23 LAB — IRON AND TIBC
Iron: 102 ug/dL (ref 28–170)
Saturation Ratios: 36 % — ABNORMAL HIGH (ref 10.4–31.8)
TIBC: 287 ug/dL (ref 250–450)
UIBC: 185 ug/dL

## 2021-08-23 LAB — CBC AND DIFFERENTIAL
HCT: 33 — AB (ref 36–46)
Hemoglobin: 11.1 — AB (ref 12.0–16.0)
Neutrophils Absolute: 6.48
Platelets: 592 10*3/uL — AB (ref 150–400)
WBC: 8.1

## 2021-08-23 LAB — CBC: RBC: 2.47 — AB (ref 3.87–5.11)

## 2021-08-23 NOTE — Telephone Encounter (Signed)
Cholesterol panel report being faxed.  ?

## 2021-08-23 NOTE — Telephone Encounter (Signed)
Per 08/23/21 los next appt scheduled and confirmed with patient ?

## 2021-08-23 NOTE — Telephone Encounter (Signed)
-----   Message from Park Liter, MD sent at 08/21/2021  8:21 PM EDT ----- ?It supposed to be that I would like to see the copy of her cholesterol for her primary care physician ?----- Message ----- ?From: Darrel Reach, CMA ?Sent: 08/21/2021   3:22 PM EDT ?To: Park Liter, MD ? ?When you say Get a copy of collection form PCP, what are you referring of?  ?----- Message ----- ?From: Park Liter, MD ?Sent: 08/15/2021   9:14 AM EDT ?To: Louie Casa, RN ? ?The way to improve it is to take care of cholesterol.  I do not see any cholesterol medication because of this atherosclerosis she need to be taken some cholesterol medication.  Last time I saw her the only cholesterol had was from 2021.  If we can get copy of collection from primary care physician we can address that ? ? ?

## 2021-08-25 ENCOUNTER — Encounter: Payer: Self-pay | Admitting: Oncology

## 2021-09-04 ENCOUNTER — Ambulatory Visit: Payer: Medicare Other | Admitting: Cardiology

## 2021-09-19 DIAGNOSIS — H2512 Age-related nuclear cataract, left eye: Secondary | ICD-10-CM | POA: Diagnosis not present

## 2021-09-19 DIAGNOSIS — H43393 Other vitreous opacities, bilateral: Secondary | ICD-10-CM | POA: Diagnosis not present

## 2021-11-28 DIAGNOSIS — D473 Essential (hemorrhagic) thrombocythemia: Secondary | ICD-10-CM | POA: Diagnosis not present

## 2021-11-28 DIAGNOSIS — R531 Weakness: Secondary | ICD-10-CM | POA: Diagnosis not present

## 2021-11-28 DIAGNOSIS — M25561 Pain in right knee: Secondary | ICD-10-CM | POA: Diagnosis not present

## 2021-11-28 DIAGNOSIS — F321 Major depressive disorder, single episode, moderate: Secondary | ICD-10-CM | POA: Diagnosis not present

## 2021-11-28 DIAGNOSIS — I10 Essential (primary) hypertension: Secondary | ICD-10-CM | POA: Diagnosis not present

## 2021-11-28 DIAGNOSIS — I5032 Chronic diastolic (congestive) heart failure: Secondary | ICD-10-CM | POA: Diagnosis not present

## 2021-11-28 DIAGNOSIS — E039 Hypothyroidism, unspecified: Secondary | ICD-10-CM | POA: Diagnosis not present

## 2021-11-28 DIAGNOSIS — I471 Supraventricular tachycardia: Secondary | ICD-10-CM | POA: Diagnosis not present

## 2021-11-28 DIAGNOSIS — E785 Hyperlipidemia, unspecified: Secondary | ICD-10-CM | POA: Diagnosis not present

## 2021-11-28 DIAGNOSIS — D509 Iron deficiency anemia, unspecified: Secondary | ICD-10-CM | POA: Diagnosis not present

## 2021-12-09 DIAGNOSIS — M25561 Pain in right knee: Secondary | ICD-10-CM | POA: Diagnosis not present

## 2021-12-19 NOTE — Progress Notes (Signed)
[facility]  [address] [place],  [place]  [address] [phone]  Clinic Day:  12/20/2021  Referring physician: Lowella Dandy, NP  HISTORY OF PRESENT ILLNESS:  The patient is a 76 y.o. female with essential thrombocythemia.  She takes hydroxyurea 500 mg twice daily to keep her platelets from precipitously rising.  Of note, the patient also has a history of iron deficiency anemia, for which IV iron has been given in the past.  She comes in today to reassess her peripheral counts.  Since her last visit, the patient has been doing okay.  She continues to deny having any clotting complications from her underlying essential thrombocythemia.  However, she has had recent falls, related to problems related to her knees.  PHYSICAL EXAM:  Blood pressure (!) 143/63, pulse 80, temperature 97.9 F (36.6 C), resp. rate 16, height 5' (1.524 m), weight 116 lb 6.4 oz (52.8 kg), SpO2 99 %. Wt Readings from Last 3 Encounters:  12/20/21 116 lb 6.4 oz (52.8 kg)  08/23/21 120 lb 1.6 oz (54.5 kg)  07/01/21 117 lb 12.8 oz (53.4 kg)   Body mass index is 22.73 kg/m. Performance status (ECOG): 1 Physical Exam Constitutional:      Appearance: Normal appearance. She is not ill-appearing.     Comments: She is ambulating with a walker.  She does look weaker versus previous visits.  HENT:     Mouth/Throat:     Mouth: Mucous membranes are moist.     Pharynx: Oropharynx is clear. No oropharyngeal exudate or posterior oropharyngeal erythema.  Cardiovascular:     Rate and Rhythm: Normal rate and regular rhythm.     Heart sounds: No murmur heard.    No friction rub. No gallop.  Pulmonary:     Effort: Pulmonary effort is normal. No respiratory distress.     Breath sounds: Normal breath sounds. No wheezing, rhonchi or rales.  Abdominal:     General: Bowel sounds are normal. There is no distension.     Palpations: Abdomen is soft. There is no mass.     Tenderness: There is no  abdominal tenderness.  Musculoskeletal:        General: No swelling.     Right lower leg: No edema.     Left lower leg: No edema.  Lymphadenopathy:     Cervical: No cervical adenopathy.     Upper Body:     Right upper body: No supraclavicular or axillary adenopathy.     Left upper body: No supraclavicular or axillary adenopathy.     Lower Body: No right inguinal adenopathy. No left inguinal adenopathy.  Skin:    General: Skin is warm.     Coloration: Skin is not jaundiced.     Findings: No lesion or rash.  Neurological:     General: No focal deficit present.     Mental Status: She is alert and oriented to person, place, and time. Mental status is at baseline.  Psychiatric:        Mood and Affect: Mood normal.        Behavior: Behavior normal.        Thought Content: Thought content normal.   LABS:      Latest Ref Rng & Units 12/20/2021   12:00 AM 08/23/2021   12:00 AM 04/24/2021   12:00 AM  CBC  WBC  7.8     8.1     8.2      Hemoglobin 12.0 - 16.0  10.8     11.1     11.4      Hematocrit 36 - 46 31     33     33      Platelets 150 - 400 K/uL 496     592     376         This result is from an external source.    Latest Reference Range & Units 12/20/21 10:15  Iron 28 - 170 ug/dL 58  UIBC ug/dL 236  TIBC 250 - 450 ug/dL 294  Saturation Ratios 10.4 - 31.8 % 20  Ferritin 11 - 307 ng/mL 100    ASSESSMENT & PLAN:  Assessment/Plan:  A 76 y.o. female with essential thrombocythemia.  I am pleased that her platelet count is lower today than what it was at her last visit.  However, her hemoglobin is also slightly lower.  Her labs today do not show any evidence of iron deficiency anemia being present.  My concern is her hydroxyurea could be having cytoreductive effects as a pertains to her red cells.  However, as her anemia is not severe, it will continue to be followed conservatively.  She knows to continue taking her hydroxyurea at 500 mg twice daily.  She also knows to take a baby  aspirin on a daily basis to offset any potential clotting complications her myeloproliferative disorder can cause.  Otherwise, I will see her back in 4 months for repeat clinical assessment.  The patient understands all the plans discussed today and is in agreement with them.    Jakobie Henslee Macarthur Critchley, MD

## 2021-12-20 ENCOUNTER — Other Ambulatory Visit: Payer: Self-pay | Admitting: Oncology

## 2021-12-20 ENCOUNTER — Telehealth: Payer: Self-pay | Admitting: Oncology

## 2021-12-20 ENCOUNTER — Inpatient Hospital Stay: Payer: Medicare Other | Attending: Oncology | Admitting: Oncology

## 2021-12-20 ENCOUNTER — Other Ambulatory Visit: Payer: Self-pay | Admitting: Hematology and Oncology

## 2021-12-20 ENCOUNTER — Inpatient Hospital Stay: Payer: Medicare Other

## 2021-12-20 VITALS — BP 143/63 | HR 80 | Temp 97.9°F | Resp 16 | Ht 60.0 in | Wt 116.4 lb

## 2021-12-20 DIAGNOSIS — Z79899 Other long term (current) drug therapy: Secondary | ICD-10-CM | POA: Diagnosis not present

## 2021-12-20 DIAGNOSIS — D473 Essential (hemorrhagic) thrombocythemia: Secondary | ICD-10-CM | POA: Diagnosis not present

## 2021-12-20 DIAGNOSIS — D649 Anemia, unspecified: Secondary | ICD-10-CM | POA: Diagnosis not present

## 2021-12-20 LAB — IRON AND TIBC
Iron: 58 ug/dL (ref 28–170)
Saturation Ratios: 20 % (ref 10.4–31.8)
TIBC: 294 ug/dL (ref 250–450)
UIBC: 236 ug/dL

## 2021-12-20 LAB — CBC AND DIFFERENTIAL
HCT: 31 — AB (ref 36–46)
Hemoglobin: 10.8 — AB (ref 12.0–16.0)
Neutrophils Absolute: 6.32
Platelets: 496 10*3/uL — AB (ref 150–400)
WBC: 7.8

## 2021-12-20 LAB — CBC: RBC: 2.31 — AB (ref 3.87–5.11)

## 2021-12-20 LAB — FERRITIN: Ferritin: 100 ng/mL (ref 11–307)

## 2021-12-20 MED ORDER — HYDROXYUREA 500 MG PO CAPS: 500.0000 mg | ORAL_CAPSULE | Freq: Two times a day (BID) | ORAL | 1 refills | Status: DC

## 2021-12-20 NOTE — Telephone Encounter (Signed)
Per 12/20/21 los next appt scheduled and confirmed with patient 

## 2022-01-06 ENCOUNTER — Ambulatory Visit: Payer: Medicare Other | Admitting: Cardiology

## 2022-01-07 ENCOUNTER — Other Ambulatory Visit: Payer: Self-pay | Admitting: Hematology and Oncology

## 2022-01-07 ENCOUNTER — Encounter: Payer: Self-pay | Admitting: Oncology

## 2022-01-07 DIAGNOSIS — D473 Essential (hemorrhagic) thrombocythemia: Secondary | ICD-10-CM

## 2022-01-07 MED ORDER — HYDROXYUREA 500 MG PO CAPS: 500.0000 mg | ORAL_CAPSULE | Freq: Two times a day (BID) | ORAL | 1 refills | Status: DC

## 2022-01-20 ENCOUNTER — Other Ambulatory Visit (HOSPITAL_COMMUNITY): Payer: Self-pay

## 2022-01-20 DIAGNOSIS — I10 Essential (primary) hypertension: Secondary | ICD-10-CM | POA: Diagnosis not present

## 2022-01-20 DIAGNOSIS — J309 Allergic rhinitis, unspecified: Secondary | ICD-10-CM | POA: Diagnosis not present

## 2022-02-10 DIAGNOSIS — Z7982 Long term (current) use of aspirin: Secondary | ICD-10-CM | POA: Diagnosis not present

## 2022-02-10 DIAGNOSIS — F32A Depression, unspecified: Secondary | ICD-10-CM | POA: Diagnosis not present

## 2022-02-10 DIAGNOSIS — Z8673 Personal history of transient ischemic attack (TIA), and cerebral infarction without residual deficits: Secondary | ICD-10-CM | POA: Diagnosis not present

## 2022-02-10 DIAGNOSIS — Z888 Allergy status to other drugs, medicaments and biological substances status: Secondary | ICD-10-CM | POA: Diagnosis not present

## 2022-02-10 DIAGNOSIS — Z79899 Other long term (current) drug therapy: Secondary | ICD-10-CM | POA: Diagnosis not present

## 2022-02-10 DIAGNOSIS — E861 Hypovolemia: Secondary | ICD-10-CM | POA: Diagnosis not present

## 2022-02-10 DIAGNOSIS — D473 Essential (hemorrhagic) thrombocythemia: Secondary | ICD-10-CM | POA: Diagnosis not present

## 2022-02-10 DIAGNOSIS — I10 Essential (primary) hypertension: Secondary | ICD-10-CM | POA: Diagnosis not present

## 2022-02-10 DIAGNOSIS — R531 Weakness: Secondary | ICD-10-CM | POA: Diagnosis not present

## 2022-02-10 DIAGNOSIS — K219 Gastro-esophageal reflux disease without esophagitis: Secondary | ICD-10-CM | POA: Diagnosis not present

## 2022-02-10 DIAGNOSIS — Z0389 Encounter for observation for other suspected diseases and conditions ruled out: Secondary | ICD-10-CM | POA: Diagnosis not present

## 2022-02-10 DIAGNOSIS — E871 Hypo-osmolality and hyponatremia: Secondary | ICD-10-CM | POA: Diagnosis not present

## 2022-02-10 DIAGNOSIS — Z885 Allergy status to narcotic agent status: Secondary | ICD-10-CM | POA: Diagnosis not present

## 2022-02-10 DIAGNOSIS — E039 Hypothyroidism, unspecified: Secondary | ICD-10-CM | POA: Diagnosis not present

## 2022-02-10 DIAGNOSIS — R1111 Vomiting without nausea: Secondary | ICD-10-CM | POA: Diagnosis not present

## 2022-02-10 DIAGNOSIS — F419 Anxiety disorder, unspecified: Secondary | ICD-10-CM | POA: Diagnosis not present

## 2022-02-20 DIAGNOSIS — Z9181 History of falling: Secondary | ICD-10-CM | POA: Diagnosis not present

## 2022-02-20 DIAGNOSIS — Z79899 Other long term (current) drug therapy: Secondary | ICD-10-CM | POA: Diagnosis not present

## 2022-02-20 DIAGNOSIS — R600 Localized edema: Secondary | ICD-10-CM | POA: Diagnosis not present

## 2022-02-20 DIAGNOSIS — E440 Moderate protein-calorie malnutrition: Secondary | ICD-10-CM | POA: Diagnosis not present

## 2022-02-20 DIAGNOSIS — E871 Hypo-osmolality and hyponatremia: Secondary | ICD-10-CM | POA: Diagnosis not present

## 2022-02-20 DIAGNOSIS — D539 Nutritional anemia, unspecified: Secondary | ICD-10-CM | POA: Diagnosis not present

## 2022-02-20 DIAGNOSIS — I10 Essential (primary) hypertension: Secondary | ICD-10-CM | POA: Diagnosis not present

## 2022-03-06 DIAGNOSIS — R600 Localized edema: Secondary | ICD-10-CM | POA: Diagnosis not present

## 2022-03-06 DIAGNOSIS — E440 Moderate protein-calorie malnutrition: Secondary | ICD-10-CM | POA: Diagnosis not present

## 2022-03-06 DIAGNOSIS — F419 Anxiety disorder, unspecified: Secondary | ICD-10-CM | POA: Diagnosis not present

## 2022-03-06 DIAGNOSIS — E871 Hypo-osmolality and hyponatremia: Secondary | ICD-10-CM | POA: Diagnosis not present

## 2022-03-06 DIAGNOSIS — I10 Essential (primary) hypertension: Secondary | ICD-10-CM | POA: Diagnosis not present

## 2022-03-06 DIAGNOSIS — G8929 Other chronic pain: Secondary | ICD-10-CM | POA: Diagnosis not present

## 2022-03-06 DIAGNOSIS — Z139 Encounter for screening, unspecified: Secondary | ICD-10-CM | POA: Diagnosis not present

## 2022-03-06 DIAGNOSIS — M545 Low back pain, unspecified: Secondary | ICD-10-CM | POA: Diagnosis not present

## 2022-03-06 DIAGNOSIS — D539 Nutritional anemia, unspecified: Secondary | ICD-10-CM | POA: Diagnosis not present

## 2022-03-14 DIAGNOSIS — M170 Bilateral primary osteoarthritis of knee: Secondary | ICD-10-CM | POA: Diagnosis not present

## 2022-03-16 DIAGNOSIS — G459 Transient cerebral ischemic attack, unspecified: Secondary | ICD-10-CM | POA: Diagnosis not present

## 2022-03-16 DIAGNOSIS — R410 Disorientation, unspecified: Secondary | ICD-10-CM | POA: Diagnosis not present

## 2022-03-16 DIAGNOSIS — E871 Hypo-osmolality and hyponatremia: Secondary | ICD-10-CM | POA: Diagnosis not present

## 2022-03-16 DIAGNOSIS — I10 Essential (primary) hypertension: Secondary | ICD-10-CM | POA: Diagnosis not present

## 2022-03-16 DIAGNOSIS — R4781 Slurred speech: Secondary | ICD-10-CM | POA: Diagnosis not present

## 2022-03-16 DIAGNOSIS — E559 Vitamin D deficiency, unspecified: Secondary | ICD-10-CM | POA: Diagnosis not present

## 2022-03-17 DIAGNOSIS — D473 Essential (hemorrhagic) thrombocythemia: Secondary | ICD-10-CM | POA: Diagnosis not present

## 2022-03-17 DIAGNOSIS — K219 Gastro-esophageal reflux disease without esophagitis: Secondary | ICD-10-CM | POA: Diagnosis not present

## 2022-03-17 DIAGNOSIS — Z888 Allergy status to other drugs, medicaments and biological substances status: Secondary | ICD-10-CM | POA: Diagnosis not present

## 2022-03-17 DIAGNOSIS — I10 Essential (primary) hypertension: Secondary | ICD-10-CM | POA: Diagnosis not present

## 2022-03-17 DIAGNOSIS — M1990 Unspecified osteoarthritis, unspecified site: Secondary | ICD-10-CM | POA: Diagnosis not present

## 2022-03-17 DIAGNOSIS — F419 Anxiety disorder, unspecified: Secondary | ICD-10-CM | POA: Diagnosis not present

## 2022-03-17 DIAGNOSIS — R9431 Abnormal electrocardiogram [ECG] [EKG]: Secondary | ICD-10-CM | POA: Diagnosis not present

## 2022-03-17 DIAGNOSIS — Z8673 Personal history of transient ischemic attack (TIA), and cerebral infarction without residual deficits: Secondary | ICD-10-CM | POA: Diagnosis not present

## 2022-03-17 DIAGNOSIS — Z885 Allergy status to narcotic agent status: Secondary | ICD-10-CM | POA: Diagnosis not present

## 2022-03-17 DIAGNOSIS — R4701 Aphasia: Secondary | ICD-10-CM | POA: Diagnosis not present

## 2022-03-17 DIAGNOSIS — G459 Transient cerebral ischemic attack, unspecified: Secondary | ICD-10-CM | POA: Diagnosis present

## 2022-03-17 DIAGNOSIS — E876 Hypokalemia: Secondary | ICD-10-CM | POA: Diagnosis not present

## 2022-03-17 DIAGNOSIS — Z7982 Long term (current) use of aspirin: Secondary | ICD-10-CM | POA: Diagnosis not present

## 2022-03-17 DIAGNOSIS — E871 Hypo-osmolality and hyponatremia: Secondary | ICD-10-CM | POA: Diagnosis not present

## 2022-03-17 DIAGNOSIS — E785 Hyperlipidemia, unspecified: Secondary | ICD-10-CM | POA: Diagnosis not present

## 2022-03-17 DIAGNOSIS — E559 Vitamin D deficiency, unspecified: Secondary | ICD-10-CM | POA: Diagnosis not present

## 2022-03-17 DIAGNOSIS — R7989 Other specified abnormal findings of blood chemistry: Secondary | ICD-10-CM | POA: Diagnosis not present

## 2022-03-17 DIAGNOSIS — F32A Depression, unspecified: Secondary | ICD-10-CM | POA: Diagnosis not present

## 2022-03-17 DIAGNOSIS — E039 Hypothyroidism, unspecified: Secondary | ICD-10-CM | POA: Diagnosis not present

## 2022-03-17 DIAGNOSIS — Z79899 Other long term (current) drug therapy: Secondary | ICD-10-CM | POA: Diagnosis not present

## 2022-03-19 DIAGNOSIS — G8929 Other chronic pain: Secondary | ICD-10-CM | POA: Diagnosis not present

## 2022-03-19 DIAGNOSIS — Z8673 Personal history of transient ischemic attack (TIA), and cerebral infarction without residual deficits: Secondary | ICD-10-CM | POA: Diagnosis not present

## 2022-03-19 DIAGNOSIS — D509 Iron deficiency anemia, unspecified: Secondary | ICD-10-CM | POA: Diagnosis not present

## 2022-03-19 DIAGNOSIS — F32A Depression, unspecified: Secondary | ICD-10-CM | POA: Diagnosis not present

## 2022-03-19 DIAGNOSIS — E039 Hypothyroidism, unspecified: Secondary | ICD-10-CM | POA: Diagnosis not present

## 2022-03-19 DIAGNOSIS — E559 Vitamin D deficiency, unspecified: Secondary | ICD-10-CM | POA: Diagnosis not present

## 2022-03-19 DIAGNOSIS — R4701 Aphasia: Secondary | ICD-10-CM | POA: Diagnosis not present

## 2022-03-19 DIAGNOSIS — I5032 Chronic diastolic (congestive) heart failure: Secondary | ICD-10-CM | POA: Diagnosis not present

## 2022-03-19 DIAGNOSIS — M5136 Other intervertebral disc degeneration, lumbar region: Secondary | ICD-10-CM | POA: Diagnosis not present

## 2022-03-19 DIAGNOSIS — K219 Gastro-esophageal reflux disease without esophagitis: Secondary | ICD-10-CM | POA: Diagnosis not present

## 2022-03-19 DIAGNOSIS — F419 Anxiety disorder, unspecified: Secondary | ICD-10-CM | POA: Diagnosis not present

## 2022-03-19 DIAGNOSIS — D473 Essential (hemorrhagic) thrombocythemia: Secondary | ICD-10-CM | POA: Diagnosis not present

## 2022-03-19 DIAGNOSIS — Z79891 Long term (current) use of opiate analgesic: Secondary | ICD-10-CM | POA: Diagnosis not present

## 2022-03-19 DIAGNOSIS — E871 Hypo-osmolality and hyponatremia: Secondary | ICD-10-CM | POA: Diagnosis not present

## 2022-03-19 DIAGNOSIS — Z7982 Long term (current) use of aspirin: Secondary | ICD-10-CM | POA: Diagnosis not present

## 2022-03-19 DIAGNOSIS — Z9181 History of falling: Secondary | ICD-10-CM | POA: Diagnosis not present

## 2022-03-19 DIAGNOSIS — M533 Sacrococcygeal disorders, not elsewhere classified: Secondary | ICD-10-CM | POA: Diagnosis not present

## 2022-03-19 DIAGNOSIS — M47896 Other spondylosis, lumbar region: Secondary | ICD-10-CM | POA: Diagnosis not present

## 2022-03-19 DIAGNOSIS — G4700 Insomnia, unspecified: Secondary | ICD-10-CM | POA: Diagnosis not present

## 2022-03-19 DIAGNOSIS — E7800 Pure hypercholesterolemia, unspecified: Secondary | ICD-10-CM | POA: Diagnosis not present

## 2022-03-19 DIAGNOSIS — M170 Bilateral primary osteoarthritis of knee: Secondary | ICD-10-CM | POA: Diagnosis not present

## 2022-03-19 DIAGNOSIS — I110 Hypertensive heart disease with heart failure: Secondary | ICD-10-CM | POA: Diagnosis not present

## 2022-03-25 DIAGNOSIS — Z79899 Other long term (current) drug therapy: Secondary | ICD-10-CM | POA: Diagnosis not present

## 2022-03-25 DIAGNOSIS — D509 Iron deficiency anemia, unspecified: Secondary | ICD-10-CM | POA: Diagnosis not present

## 2022-03-25 DIAGNOSIS — D473 Essential (hemorrhagic) thrombocythemia: Secondary | ICD-10-CM | POA: Diagnosis not present

## 2022-03-25 DIAGNOSIS — K589 Irritable bowel syndrome without diarrhea: Secondary | ICD-10-CM | POA: Diagnosis not present

## 2022-03-25 DIAGNOSIS — G459 Transient cerebral ischemic attack, unspecified: Secondary | ICD-10-CM | POA: Diagnosis not present

## 2022-03-25 DIAGNOSIS — R531 Weakness: Secondary | ICD-10-CM | POA: Diagnosis not present

## 2022-03-25 DIAGNOSIS — I10 Essential (primary) hypertension: Secondary | ICD-10-CM | POA: Diagnosis not present

## 2022-03-25 DIAGNOSIS — I5032 Chronic diastolic (congestive) heart failure: Secondary | ICD-10-CM | POA: Diagnosis not present

## 2022-03-25 DIAGNOSIS — R600 Localized edema: Secondary | ICD-10-CM | POA: Diagnosis not present

## 2022-03-25 DIAGNOSIS — E871 Hypo-osmolality and hyponatremia: Secondary | ICD-10-CM | POA: Diagnosis not present

## 2022-03-25 DIAGNOSIS — E559 Vitamin D deficiency, unspecified: Secondary | ICD-10-CM | POA: Diagnosis not present

## 2022-03-26 DIAGNOSIS — M47896 Other spondylosis, lumbar region: Secondary | ICD-10-CM | POA: Diagnosis not present

## 2022-03-26 DIAGNOSIS — D473 Essential (hemorrhagic) thrombocythemia: Secondary | ICD-10-CM | POA: Diagnosis not present

## 2022-03-26 DIAGNOSIS — M170 Bilateral primary osteoarthritis of knee: Secondary | ICD-10-CM | POA: Diagnosis not present

## 2022-03-26 DIAGNOSIS — I110 Hypertensive heart disease with heart failure: Secondary | ICD-10-CM | POA: Diagnosis not present

## 2022-03-26 DIAGNOSIS — I5032 Chronic diastolic (congestive) heart failure: Secondary | ICD-10-CM | POA: Diagnosis not present

## 2022-03-26 DIAGNOSIS — M5136 Other intervertebral disc degeneration, lumbar region: Secondary | ICD-10-CM | POA: Diagnosis not present

## 2022-03-31 DIAGNOSIS — E440 Moderate protein-calorie malnutrition: Secondary | ICD-10-CM | POA: Diagnosis not present

## 2022-03-31 DIAGNOSIS — R600 Localized edema: Secondary | ICD-10-CM | POA: Diagnosis not present

## 2022-03-31 DIAGNOSIS — E871 Hypo-osmolality and hyponatremia: Secondary | ICD-10-CM | POA: Diagnosis not present

## 2022-03-31 DIAGNOSIS — I10 Essential (primary) hypertension: Secondary | ICD-10-CM | POA: Diagnosis not present

## 2022-04-04 DIAGNOSIS — M170 Bilateral primary osteoarthritis of knee: Secondary | ICD-10-CM | POA: Diagnosis not present

## 2022-04-04 DIAGNOSIS — D473 Essential (hemorrhagic) thrombocythemia: Secondary | ICD-10-CM | POA: Diagnosis not present

## 2022-04-04 DIAGNOSIS — I5032 Chronic diastolic (congestive) heart failure: Secondary | ICD-10-CM | POA: Diagnosis not present

## 2022-04-04 DIAGNOSIS — I110 Hypertensive heart disease with heart failure: Secondary | ICD-10-CM | POA: Diagnosis not present

## 2022-04-04 DIAGNOSIS — M47896 Other spondylosis, lumbar region: Secondary | ICD-10-CM | POA: Diagnosis not present

## 2022-04-04 DIAGNOSIS — M5136 Other intervertebral disc degeneration, lumbar region: Secondary | ICD-10-CM | POA: Diagnosis not present

## 2022-04-11 DIAGNOSIS — I5032 Chronic diastolic (congestive) heart failure: Secondary | ICD-10-CM | POA: Diagnosis not present

## 2022-04-11 DIAGNOSIS — I110 Hypertensive heart disease with heart failure: Secondary | ICD-10-CM | POA: Diagnosis not present

## 2022-04-11 DIAGNOSIS — M5136 Other intervertebral disc degeneration, lumbar region: Secondary | ICD-10-CM | POA: Diagnosis not present

## 2022-04-11 DIAGNOSIS — M170 Bilateral primary osteoarthritis of knee: Secondary | ICD-10-CM | POA: Diagnosis not present

## 2022-04-11 DIAGNOSIS — M47896 Other spondylosis, lumbar region: Secondary | ICD-10-CM | POA: Diagnosis not present

## 2022-04-11 DIAGNOSIS — D473 Essential (hemorrhagic) thrombocythemia: Secondary | ICD-10-CM | POA: Diagnosis not present

## 2022-04-17 DIAGNOSIS — D473 Essential (hemorrhagic) thrombocythemia: Secondary | ICD-10-CM | POA: Diagnosis not present

## 2022-04-17 DIAGNOSIS — I110 Hypertensive heart disease with heart failure: Secondary | ICD-10-CM | POA: Diagnosis not present

## 2022-04-17 DIAGNOSIS — M25551 Pain in right hip: Secondary | ICD-10-CM | POA: Diagnosis not present

## 2022-04-17 DIAGNOSIS — I5032 Chronic diastolic (congestive) heart failure: Secondary | ICD-10-CM | POA: Diagnosis not present

## 2022-04-17 DIAGNOSIS — E871 Hypo-osmolality and hyponatremia: Secondary | ICD-10-CM | POA: Diagnosis not present

## 2022-04-17 DIAGNOSIS — M170 Bilateral primary osteoarthritis of knee: Secondary | ICD-10-CM | POA: Diagnosis not present

## 2022-04-17 DIAGNOSIS — R600 Localized edema: Secondary | ICD-10-CM | POA: Diagnosis not present

## 2022-04-17 DIAGNOSIS — R531 Weakness: Secondary | ICD-10-CM | POA: Diagnosis not present

## 2022-04-17 DIAGNOSIS — M5136 Other intervertebral disc degeneration, lumbar region: Secondary | ICD-10-CM | POA: Diagnosis not present

## 2022-04-17 DIAGNOSIS — M47896 Other spondylosis, lumbar region: Secondary | ICD-10-CM | POA: Diagnosis not present

## 2022-04-17 DIAGNOSIS — D509 Iron deficiency anemia, unspecified: Secondary | ICD-10-CM | POA: Diagnosis not present

## 2022-04-28 ENCOUNTER — Encounter: Payer: Self-pay | Admitting: Cardiology

## 2022-04-28 ENCOUNTER — Ambulatory Visit: Payer: Medicare Other | Attending: Cardiology | Admitting: Cardiology

## 2022-04-28 VITALS — BP 170/84 | HR 82 | Ht 60.0 in | Wt 132.0 lb

## 2022-04-28 DIAGNOSIS — E7800 Pure hypercholesterolemia, unspecified: Secondary | ICD-10-CM | POA: Diagnosis not present

## 2022-04-28 DIAGNOSIS — I5032 Chronic diastolic (congestive) heart failure: Secondary | ICD-10-CM | POA: Diagnosis not present

## 2022-04-28 DIAGNOSIS — I10 Essential (primary) hypertension: Secondary | ICD-10-CM | POA: Insufficient documentation

## 2022-04-28 DIAGNOSIS — G459 Transient cerebral ischemic attack, unspecified: Secondary | ICD-10-CM | POA: Diagnosis not present

## 2022-04-28 NOTE — Addendum Note (Signed)
Addended by: Edwyna Shell I on: 04/28/2022 11:29 AM   Modules accepted: Orders

## 2022-04-28 NOTE — Patient Instructions (Signed)
Medication Instructions:  Your physician recommends that you continue on your current medications as directed. Please refer to the Current Medication list given to you today.  *If you need a refill on your cardiac medications before your next appointment, please call your pharmacy*   Lab Work: Your physician recommends that you return for lab work in:   Labs today: CMP, Pro BNP, TSH, Urinalysis  If you have labs (blood work) drawn today and your tests are completely normal, you will receive your results only by: MyChart Message (if you have MyChart) OR A paper copy in the mail If you have any lab test that is abnormal or we need to change your treatment, we will call you to review the results.   Testing/Procedures: Your physician has requested that you have an echocardiogram. Echocardiography is a painless test that uses sound waves to create images of your heart. It provides your doctor with information about the size and shape of your heart and how well your heart's chambers and valves are working. This procedure takes approximately one hour. There are no restrictions for this procedure. Please do NOT wear cologne, perfume, aftershave, or lotions (deodorant is allowed). Please arrive 15 minutes prior to your appointment time.    Follow-Up: At [facility], you and your health needs are our priority.  As part of our continuing mission to provide you with exceptional heart care, we have created designated Provider Care Teams.  These Care Teams include your primary Cardiologist (physician) and Advanced Practice Providers (APPs -  Physician Assistants and Nurse Practitioners) who all work together to provide you with the care you need, when you need it.  We recommend signing up for the patient portal called "MyChart".  Sign up information is provided on this After Visit Summary.  MyChart is used to connect with patients for Virtual Visits (Telemedicine).  Patients are able to view  lab/test results, encounter notes, upcoming appointments, etc.  Non-urgent messages can be sent to your provider as well.   To learn more about what you can do with MyChart, go to [email or web address].    Your next appointment:   3 month(s)  The format for your next appointment:   In Person  Provider:   Jenne Campus, MD    Other Instructions None  Important Information About Sugar

## 2022-04-28 NOTE — Progress Notes (Signed)
Cardiology Office Note:    Date:  04/28/2022   ID:  Jordan Higgins, DOB 10-18-1945, MRN [id number]  PCP:  Jordan Dandy, NP  Cardiologist:  Jenne Campus, MD    Referring MD: Jordan Dandy, NP   Chief Complaint  Patient presents with   Follow-up    History of Present Illness:    Jordan Higgins is a 76 y.o. female   who was recently admitted with episode of what appears to be TIA.  Quite extensive evaluation has been performed after that she did have angiogram of her artery in her neck as well as echocardiogram and then eventually TEE trying to find a cardiac source of emboli.  She also had Zio patch for 2 weeks which showed on supraventricular tachycardia as well as APCs but no atrial fibrillation.  She is coming today 2 months for follow-up.  She also got thrombocytosis and also she apparently skipped some medication she did take medication the right leg.  She had recently visit with her oncologist/hematologist Dr. Cassandria Higgins who told her that that may be what is responsible for his CVA.    She is in my office to date for follow-up.  He is not doing well.  She described to have swelling of lower extremities, she was found to have low sodium she was given some salt tablets and she was found to have hypokalemia and potassium has been supplemented she is weak tired exhausted denies have any chest pain tightness squeezing pressure burning chest feels fatigue tiredness swelling of lower extremities bother her a lot.  Past Medical History:  Diagnosis Date   Anxiety    Chronic diastolic heart failure ([place]) 01/18/2016   Chronic lower back pain    Essential thrombocythemia ([place])    GERD (gastroesophageal reflux disease)    Headache(784.0) 01/16/2012   "all day; think its related to TIA"   High cholesterol    Hypertension    Hyponatremia 01/18/2016   Hypothyroidism    PONV (postoperative nausea and vomiting)    "violently; only thing that works for her is scopolamine patch "    Stroke ([place]) 12/20/2020   TIA (transient ischemic attack) 01/16/2012    Past Surgical History:  Procedure Laterality Date   BACK SURGERY     BLADDER SUSPENSION  1990's   "w/rectal suspensiion"   BUBBLE STUDY  01/07/2021   Procedure: BUBBLE STUDY;  Surgeon: Elouise Munroe, MD;  Location: [facility] ENDOSCOPY;  Service: Cardiology;;   CHOLECYSTECTOMY  ~ 2010   [place]  11/15/2011; early 12/2011   "abscess; S/P back OR"; "staph infection in wound"   [place]  11/06/2011   "shaved down 2 bulging discs"   [place]  ~ 12/07/2011   right upper arm   TEE WITHOUT CARDIOVERSION N/A 01/07/2021   Procedure: TRANSESOPHAGEAL ECHOCARDIOGRAM (TEE);  Surgeon: Elouise Munroe, MD;  Location: [facility];  Service: Cardiology;  Laterality: N/A;    Current Medications: Current Meds  Medication Sig   aspirin EC 81 MG tablet Take 81 mg by mouth in the morning. Swallow whole.   busPIRone (BUSPAR) 5 MG tablet Take 5 mg by mouth 3 (three) times daily as needed (nerves).   cholecalciferol (VITAMIN D3) 25 MCG (1000 UNIT) tablet Take 5,000 Units by mouth daily.   hydroxyurea (HYDREA) 500 MG capsule Take 1 capsule (500 mg total) by mouth 2 (two) times daily.   levothyroxine (SYNTHROID, LEVOTHROID) 88 MCG tablet  Take 88 mcg by mouth daily before breakfast. Take 1 tablet (88 mcg) by mouth in the morning every day EXCEPT on Sunday.   Polyethyl Glycol-Propyl Glycol (SYSTANE) 0.4-0.3 % SOLN Place 1-2 drops into both eyes 3 (three) times daily as needed (dry/irritated eyes.).   potassium chloride (KLOR-CON) 10 MEQ tablet Take 10 mEq by mouth daily.   traMADol-acetaminophen (ULTRACET) 37.5-325 MG tablet Take 1 tablet by mouth 2 (two) times daily as needed for moderate pain or severe pain (chronic pain.).   valsartan (DIOVAN) 80 MG tablet Take 80 mg by mouth daily.   zolpidem (AMBIEN) 10 MG tablet Take 10 mg by mouth at bedtime as needed for sleep.      Allergies:   Trazodone   Social History   Socioeconomic History   Marital status: Married    Spouse name: Not on file   Number of children: Not on file   Years of education: Not on file   Highest education level: Not on file  Occupational History   Not on file  Tobacco Use   Smoking status: Never   Smokeless tobacco: Never  Substance and Sexual Activity   Alcohol use: No    Alcohol/week: 0.0 standard drinks of alcohol   Drug use: No   Sexual activity: Not on file  Other Topics Concern   Not on file  Social History Narrative   Not on file   Social Determinants of Health   Financial Resource Strain: Not on file  Food Insecurity: Not on file  Transportation Needs: Not on file  Physical Activity: Not on file  Stress: Not on file  Social Connections: Not on file     Family History: The patient's family history includes Heart attack in her brother. ROS:   Please see the history of present illness.    All 14 point review of systems negative except as described per history of present illness  EKGs/Labs/Other Studies Reviewed:      Recent Labs: 12/20/2021: Hemoglobin 10.8; Platelets 496  Recent Lipid Panel    Component Value Date/Time   CHOL 138 01/17/2012 0403   TRIG 135 01/17/2012 0403   HDL 44 01/17/2012 0403   CHOLHDL 3.1 01/17/2012 0403   VLDL 27 01/17/2012 0403   LDLCALC 67 01/17/2012 0403    Physical Exam:    VS:  BP (!) 170/84 (BP Location: Left Arm, Patient Position: Sitting, Cuff Size: Small)   Pulse 82   Ht 5' (1.524 m)   Wt 132 lb (59.9 kg)   SpO2 99%   BMI 25.78 kg/m     Wt Readings from Last 3 Encounters:  04/28/22 132 lb (59.9 kg)  12/20/21 116 lb 6.4 oz (52.8 kg)  08/23/21 120 lb 1.6 oz (54.5 kg)     GEN:  Well nourished, well developed in no acute distress HEENT: Normal NECK: JVD is barely visible while she is sitting up; No carotid bruits LYMPHATICS: No lymphadenopathy CARDIAC: RRR, no murmurs, no rubs, no gallops RESPIRATORY:   Clear to auscultation without rales, wheezing or rhonchi  ABDOMEN: Soft, non-tender, non-distended MUSCULOSKELETAL:  No edema; No deformity  SKIN: Warm and dry LOWER EXTREMITIES: 2+ swelling NEUROLOGIC:  Alert and oriented x 3 PSYCHIATRIC:  Normal affect   ASSESSMENT:    1. Chronic diastolic heart failure ([place])   2. Primary hypertension   3. TIA (transient ischemic attack)   4. High cholesterol    PLAN:    In order of problems listed above:  Swelling of lower extremities  in this lady with chronic diastolic congestive heart failure.  I will ask her to have proBNP done, will do complete metabolic panel 20 figure out what her protein and albumin's are which could be contributing to her swelling.  I will also ask her to have a urine analysis done to see if there is any protein loss in the urine I told her with her electrolyte abnormality meaning low sodium low potassium probably hypoalbuminemia hypoproteinemia probably proteinuria she may need to see nephrologist however I will try to answer the question if any of this is responsible for her heart.  I will ask her also to have an echocardiogram done. Essential hypertension blood pressure elevated today but she tells me every single time she see physician she have high blood pressure. History of TIA workup negative continue present management. High cholesterol I will not do any testing for it right now I did review K PN which show me LDL 102 HDL 65 this is from March of this year.  We will continue monitoring.   Medication Adjustments/Labs and Tests Ordered: Current medicines are reviewed at length with the patient today.  Concerns regarding medicines are outlined above.  No orders of the defined types were placed in this encounter.  Medication changes: No orders of the defined types were placed in this encounter.   Signed, Park Liter, MD, [facility] 04/28/2022 11:08 AM    [place]

## 2022-04-29 LAB — URINALYSIS
Bilirubin, UA: NEGATIVE
Glucose, UA: NEGATIVE
Leukocytes,UA: NEGATIVE
Nitrite, UA: NEGATIVE
RBC, UA: NEGATIVE
Specific Gravity, UA: 1.028 (ref 1.005–1.030)
Urobilinogen, Ur: 0.2 mg/dL (ref 0.2–1.0)
pH, UA: 6 (ref 5.0–7.5)

## 2022-04-29 LAB — COMPREHENSIVE METABOLIC PANEL
ALT: 9 IU/L (ref 0–32)
AST: 32 IU/L (ref 0–40)
Albumin/Globulin Ratio: 1.6 (ref 1.2–2.2)
Albumin: 3.1 g/dL — ABNORMAL LOW (ref 3.8–4.8)
Alkaline Phosphatase: 98 IU/L (ref 44–121)
BUN/Creatinine Ratio: 15 (ref 12–28)
BUN: 10 mg/dL (ref 8–27)
Bilirubin Total: 0.6 mg/dL (ref 0.0–1.2)
CO2: 21 mmol/L (ref 20–29)
Calcium: 8.5 mg/dL — ABNORMAL LOW (ref 8.7–10.3)
Chloride: 101 mmol/L (ref 96–106)
Creatinine, Ser: 0.65 mg/dL (ref 0.57–1.00)
Globulin, Total: 1.9 g/dL (ref 1.5–4.5)
Glucose: 107 mg/dL — ABNORMAL HIGH (ref 70–99)
Potassium: 3.9 mmol/L (ref 3.5–5.2)
Sodium: 134 mmol/L (ref 134–144)
Total Protein: 5 g/dL — ABNORMAL LOW (ref 6.0–8.5)
eGFR: 90 mL/min/{1.73_m2} (ref >59)

## 2022-04-29 LAB — TSH: TSH: 3.03 u[IU]/mL (ref 0.450–4.500)

## 2022-04-29 LAB — PRO B NATRIURETIC PEPTIDE: NT-Pro BNP: 2445 pg/mL — ABNORMAL HIGH (ref 0–738)

## 2022-05-01 ENCOUNTER — Ambulatory Visit: Payer: Medicare Other | Attending: Cardiology

## 2022-05-01 DIAGNOSIS — I10 Essential (primary) hypertension: Secondary | ICD-10-CM | POA: Diagnosis not present

## 2022-05-01 DIAGNOSIS — G459 Transient cerebral ischemic attack, unspecified: Secondary | ICD-10-CM | POA: Insufficient documentation

## 2022-05-01 DIAGNOSIS — I5032 Chronic diastolic (congestive) heart failure: Secondary | ICD-10-CM | POA: Diagnosis not present

## 2022-05-01 DIAGNOSIS — E7800 Pure hypercholesterolemia, unspecified: Secondary | ICD-10-CM | POA: Diagnosis not present

## 2022-05-01 LAB — ECHOCARDIOGRAM COMPLETE
Area-P 1/2: 6.32 cm2
S' Lateral: 3.4 cm

## 2022-05-02 ENCOUNTER — Telehealth: Payer: Self-pay | Admitting: Cardiology

## 2022-05-02 ENCOUNTER — Telehealth: Payer: Self-pay

## 2022-05-02 DIAGNOSIS — I5043 Acute on chronic combined systolic (congestive) and diastolic (congestive) heart failure: Secondary | ICD-10-CM

## 2022-05-02 MED ORDER — POTASSIUM CHLORIDE ER 20 MEQ PO TBCR: 10.0000 meq | EXTENDED_RELEASE_TABLET | Freq: Every day | ORAL | 3 refills | Status: DC

## 2022-05-02 MED ORDER — FUROSEMIDE 20 MG PO TABS: 20.0000 mg | ORAL_TABLET | Freq: Every day | ORAL | 3 refills | Status: DC

## 2022-05-02 NOTE — Telephone Encounter (Signed)
-----   Message from Park Liter, MD sent at 05/01/2022  9:29 AM EST ----- She does have evidence of congestive heart failure, and I want to start her on diuretic.  It looks like she does not take anything if that is the case I will recommend to start 20 mg of Lasix every single day with 20 mill equivalents of potassium, she also does have hypoalbuminemia as I anticipated.  She does have protein in the urine, that need to be taken care of primary care physician and ideally by nephrologist so it need to be referred to primary care physician when we start Lasix and potassium will need to recheck her Chem-7 in about a week

## 2022-05-02 NOTE — Telephone Encounter (Signed)
Daughter called for her mother results

## 2022-05-02 NOTE — Telephone Encounter (Signed)
Results reviewed with Otila Kluver per DPR as per Dr. Wendy Poet note.  Otila Kluver verbalized understanding and had no additional questions.

## 2022-05-07 ENCOUNTER — Telehealth: Payer: Self-pay

## 2022-05-07 DIAGNOSIS — I5032 Chronic diastolic (congestive) heart failure: Secondary | ICD-10-CM

## 2022-05-07 MED ORDER — ENTRESTO 24-26 MG PO TABS: 1.0000 | ORAL_TABLET | Freq: Two times a day (BID) | ORAL | 12 refills | Status: DC

## 2022-05-07 NOTE — Telephone Encounter (Signed)
-----   Message from Park Liter, MD sent at 05/05/2022 10:10 AM EST ----- Echocardiogram showed ejection fraction 30 to 35%, mild mitral valve regurgitation, need to stop valsartan and start Entresto 2426 twice daily, Chem-7 in 1 week week

## 2022-05-07 NOTE — Telephone Encounter (Signed)
  Pt is calling back, she is requesting if she can get a copy of her results (echo and labs) and recommendations sent to her mailing address

## 2022-05-07 NOTE — Telephone Encounter (Signed)
Labs and echo printed and mailed to pt.

## 2022-05-08 ENCOUNTER — Other Ambulatory Visit: Payer: Self-pay | Admitting: Oncology

## 2022-05-08 DIAGNOSIS — D473 Essential (hemorrhagic) thrombocythemia: Secondary | ICD-10-CM

## 2022-05-08 NOTE — Progress Notes (Incomplete)
[facility]  [address] [place],  [place]  [address] [phone]  Clinic Day:  12/20/2021  Referring physician: Lowella Dandy, NP  HISTORY OF PRESENT ILLNESS:  The patient is a 76 y.o. female with essential thrombocythemia.  She takes hydroxyurea 500 mg twice daily to keep her platelets from precipitously rising.  Of note, the patient also has a history of iron deficiency anemia, for which IV iron has been given in the past.  She comes in today to reassess her peripheral counts.  Since her last visit, the patient has been doing okay.  She continues to deny having any clotting complications from her underlying essential thrombocythemia.  However, she has had recent falls, related to problems related to her knees.  PHYSICAL EXAM:  There were no vitals taken for this visit. Wt Readings from Last 3 Encounters:  04/28/22 132 lb (59.9 kg)  12/20/21 116 lb 6.4 oz (52.8 kg)  08/23/21 120 lb 1.6 oz (54.5 kg)   There is no height or weight on file to calculate BMI. Performance status (ECOG): 1 Physical Exam Constitutional:      Appearance: Normal appearance. She is not ill-appearing.     Comments: She is ambulating with a walker.  She does look weaker versus previous visits.  HENT:     Mouth/Throat:     Mouth: Mucous membranes are moist.     Pharynx: Oropharynx is clear. No oropharyngeal exudate or posterior oropharyngeal erythema.  Cardiovascular:     Rate and Rhythm: Normal rate and regular rhythm.     Heart sounds: No murmur heard.    No friction rub. No gallop.  Pulmonary:     Effort: Pulmonary effort is normal. No respiratory distress.     Breath sounds: Normal breath sounds. No wheezing, rhonchi or rales.  Abdominal:     General: Bowel sounds are normal. There is no distension.     Palpations: Abdomen is soft. There is no mass.     Tenderness: There is no abdominal tenderness.  Musculoskeletal:        General: No swelling.     Right lower leg:  No edema.     Left lower leg: No edema.  Lymphadenopathy:     Cervical: No cervical adenopathy.     Upper Body:     Right upper body: No supraclavicular or axillary adenopathy.     Left upper body: No supraclavicular or axillary adenopathy.     Lower Body: No right inguinal adenopathy. No left inguinal adenopathy.  Skin:    General: Skin is warm.     Coloration: Skin is not jaundiced.     Findings: No lesion or rash.  Neurological:     General: No focal deficit present.     Mental Status: She is alert and oriented to person, place, and time. Mental status is at baseline.  Psychiatric:        Mood and Affect: Mood normal.        Behavior: Behavior normal.        Thought Content: Thought content normal.   LABS:      Latest Ref Rng & Units 12/20/2021   12:00 AM 08/23/2021   12:00 AM 04/24/2021   12:00 AM  CBC  WBC  7.8     8.1     8.2      Hemoglobin 12.0 - 16.0 10.8     11.1     11.4      Hematocrit  36 - 46 31     33     33      Platelets 150 - 400 K/uL 496     592     376         This result is from an external source.     Latest Reference Range & Units 12/20/21 10:15  Iron 28 - 170 ug/dL 58  UIBC ug/dL 236  TIBC 250 - 450 ug/dL 294  Saturation Ratios 10.4 - 31.8 % 20  Ferritin 11 - 307 ng/mL 100    ASSESSMENT & PLAN:  Assessment/Plan:  A 76 y.o. female with essential thrombocythemia.  I am pleased that her platelet count is lower today than what it was at her last visit.  However, her hemoglobin is also slightly lower.  Her labs today do not show any evidence of iron deficiency anemia being present.  My concern is her hydroxyurea could be having cytoreductive effects as a pertains to her red cells.  However, as her anemia is not severe, it will continue to be followed conservatively.  She knows to continue taking her hydroxyurea at 500 mg twice daily.  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  Otherwise, I will see her back in 4 months for repeat clinical assessment.  The patient understands all the plans discussed today and is in agreement with them.    Janaiya Beauchesne Macarthur Critchley, MD

## 2022-05-09 ENCOUNTER — Ambulatory Visit: Payer: Medicare Other | Admitting: Oncology

## 2022-05-09 ENCOUNTER — Inpatient Hospital Stay: Payer: Medicare Other

## 2022-05-15 DIAGNOSIS — I5032 Chronic diastolic (congestive) heart failure: Secondary | ICD-10-CM | POA: Diagnosis not present

## 2022-05-16 LAB — BASIC METABOLIC PANEL
BUN/Creatinine Ratio: 15 (ref 12–28)
BUN: 15 mg/dL (ref 8–27)
CO2: 25 mmol/L (ref 20–29)
Calcium: 8.7 mg/dL (ref 8.7–10.3)
Chloride: 100 mmol/L (ref 96–106)
Creatinine, Ser: 1 mg/dL (ref 0.57–1.00)
Glucose: 121 mg/dL — ABNORMAL HIGH (ref 70–99)
Potassium: 5.1 mmol/L (ref 3.5–5.2)
Sodium: 136 mmol/L (ref 134–144)
eGFR: 58 mL/min/{1.73_m2} — ABNORMAL LOW (ref >59)

## 2022-05-22 ENCOUNTER — Telehealth: Payer: Self-pay | Admitting: Cardiology

## 2022-05-22 NOTE — Telephone Encounter (Signed)
Results reviewed with pt as per Dr. Krasowski's note.  Pt verbalized understanding and had no additional questions. Routed to PCP  

## 2022-05-22 NOTE — Telephone Encounter (Signed)
Pt calling for lab results.

## 2022-06-02 DIAGNOSIS — M47896 Other spondylosis, lumbar region: Secondary | ICD-10-CM | POA: Insufficient documentation

## 2022-06-02 DIAGNOSIS — K589 Irritable bowel syndrome without diarrhea: Secondary | ICD-10-CM | POA: Insufficient documentation

## 2022-06-02 DIAGNOSIS — F321 Major depressive disorder, single episode, moderate: Secondary | ICD-10-CM | POA: Insufficient documentation

## 2022-06-02 DIAGNOSIS — G4700 Insomnia, unspecified: Secondary | ICD-10-CM | POA: Insufficient documentation

## 2022-06-02 DIAGNOSIS — I110 Hypertensive heart disease with heart failure: Secondary | ICD-10-CM | POA: Insufficient documentation

## 2022-06-02 DIAGNOSIS — Z9181 History of falling: Secondary | ICD-10-CM | POA: Insufficient documentation

## 2022-06-04 ENCOUNTER — Other Ambulatory Visit: Payer: Medicare Other

## 2022-06-04 ENCOUNTER — Ambulatory Visit: Payer: Medicare Other | Admitting: Oncology

## 2022-06-04 DIAGNOSIS — M5136 Other intervertebral disc degeneration, lumbar region: Secondary | ICD-10-CM | POA: Diagnosis not present

## 2022-06-04 DIAGNOSIS — M5451 Vertebrogenic low back pain: Secondary | ICD-10-CM | POA: Diagnosis not present

## 2022-06-10 ENCOUNTER — Other Ambulatory Visit: Payer: Medicare Other

## 2022-06-10 ENCOUNTER — Ambulatory Visit: Payer: Medicare Other | Admitting: Oncology

## 2022-06-10 ENCOUNTER — Other Ambulatory Visit: Payer: Self-pay | Admitting: Oncology

## 2022-06-10 DIAGNOSIS — D473 Essential (hemorrhagic) thrombocythemia: Secondary | ICD-10-CM

## 2022-06-10 NOTE — Progress Notes (Deleted)
[facility]  [address] [place],  [place]  [address] [phone]  Clinic Day:  12/20/2021  Referring physician: Lowella Dandy, NP  HISTORY OF PRESENT ILLNESS:  The patient is a 77 y.o. female with essential thrombocythemia.  She takes hydroxyurea 500 mg twice daily to keep her platelets from precipitously rising.  Of note, the patient also has a history of iron deficiency anemia, for which IV iron has been given in the past.  She comes in today to reassess her peripheral counts.  Since her last visit, the patient has been doing okay.  She continues to deny having any clotting complications from her underlying essential thrombocythemia.  However, she has had recent falls, related to problems related to her knees.  PHYSICAL EXAM:  There were no vitals taken for this visit. Wt Readings from Last 3 Encounters:  04/28/22 132 lb (59.9 kg)  12/20/21 116 lb 6.4 oz (52.8 kg)  08/23/21 120 lb 1.6 oz (54.5 kg)   There is no height or weight on file to calculate BMI. Performance status (ECOG): 1 Physical Exam Constitutional:      Appearance: Normal appearance. She is not ill-appearing.     Comments: She is ambulating with a walker.  She does look weaker versus previous visits.  HENT:     Mouth/Throat:     Mouth: Mucous membranes are moist.     Pharynx: Oropharynx is clear. No oropharyngeal exudate or posterior oropharyngeal erythema.  Cardiovascular:     Rate and Rhythm: Normal rate and regular rhythm.     Heart sounds: No murmur heard.    No friction rub. No gallop.  Pulmonary:     Effort: Pulmonary effort is normal. No respiratory distress.     Breath sounds: Normal breath sounds. No wheezing, rhonchi or rales.  Abdominal:     General: Bowel sounds are normal. There is no distension.     Palpations: Abdomen is soft. There is no mass.     Tenderness: There is no abdominal tenderness.  Musculoskeletal:        General: No swelling.     Right lower leg:  No edema.     Left lower leg: No edema.  Lymphadenopathy:     Cervical: No cervical adenopathy.     Upper Body:     Right upper body: No supraclavicular or axillary adenopathy.     Left upper body: No supraclavicular or axillary adenopathy.     Lower Body: No right inguinal adenopathy. No left inguinal adenopathy.  Skin:    General: Skin is warm.     Coloration: Skin is not jaundiced.     Findings: No lesion or rash.  Neurological:     General: No focal deficit present.     Mental Status: She is alert and oriented to person, place, and time. Mental status is at baseline.  Psychiatric:        Mood and Affect: Mood normal.        Behavior: Behavior normal.        Thought Content: Thought content normal.    LABS:      Latest Ref Rng & Units 12/20/2021   12:00 AM 08/23/2021   12:00 AM 04/24/2021   12:00 AM  CBC  WBC  7.8     8.1     8.2      Hemoglobin 12.0 - 16.0 10.8     11.1     11.4  Hematocrit 36 - 46 31     33     33      Platelets 150 - 400 K/uL 496     592     376         This result is from an external source.     Latest Reference Range & Units 12/20/21 10:15  Iron 28 - 170 ug/dL 58  UIBC ug/dL 236  TIBC 250 - 450 ug/dL 294  Saturation Ratios 10.4 - 31.8 % 20  Ferritin 11 - 307 ng/mL 100    ASSESSMENT & PLAN:  Assessment/Plan:  A 77 y.o. female with essential thrombocythemia.  I am pleased that her platelet count is lower today than what it was at her last visit.  However, her hemoglobin is also slightly lower.  Her labs today do not show any evidence of iron deficiency anemia being present.  My concern is her hydroxyurea could be having cytoreductive effects as a pertains to her red cells.  However, as her anemia is not severe, it will continue to be followed conservatively.  She knows to continue taking her hydroxyurea at 500 mg twice daily.  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  Otherwise, I will see her back in 4 months for repeat clinical assessment.  The patient understands all the plans discussed today and is in agreement with them.    Orley Lawry Macarthur Critchley, MD

## 2022-06-30 DIAGNOSIS — I5042 Chronic combined systolic (congestive) and diastolic (congestive) heart failure: Secondary | ICD-10-CM | POA: Diagnosis not present

## 2022-06-30 DIAGNOSIS — E8809 Other disorders of plasma-protein metabolism, not elsewhere classified: Secondary | ICD-10-CM | POA: Diagnosis not present

## 2022-06-30 DIAGNOSIS — S91301A Unspecified open wound, right foot, initial encounter: Secondary | ICD-10-CM | POA: Diagnosis not present

## 2022-06-30 DIAGNOSIS — D509 Iron deficiency anemia, unspecified: Secondary | ICD-10-CM | POA: Diagnosis not present

## 2022-06-30 DIAGNOSIS — I10 Essential (primary) hypertension: Secondary | ICD-10-CM | POA: Diagnosis not present

## 2022-06-30 DIAGNOSIS — I5032 Chronic diastolic (congestive) heart failure: Secondary | ICD-10-CM | POA: Diagnosis not present

## 2022-06-30 DIAGNOSIS — G8929 Other chronic pain: Secondary | ICD-10-CM | POA: Diagnosis not present

## 2022-07-01 NOTE — Progress Notes (Signed)
[facility]  [address] [place],  [place]  [address] [phone]  Clinic Day:  07/02/2022  Referring physician: Lowella Dandy, NP  HISTORY OF PRESENT ILLNESS:  The patient is a 77 y.o. female with essential thrombocythemia.  She takes hydroxyurea 500 mg twice daily to keep her platelets from precipitously rising.  Of note, the patient also has a history of iron deficiency anemia, for which IV iron has been given in the past.  She comes in today to reassess her peripheral counts.  Since her last visit, the patient has been doing okay.  She continues to deny having any clotting complications from her underlying essential thrombocythemia.  However, she has had fluid buildup in her lower legs for which diuretics have been used to bring this down without causing severe dehydration.  PHYSICAL EXAM:  Blood pressure (!) 176/83, pulse 85, temperature 98.6 F (37 C), resp. rate 14, height 5' (1.524 m), weight 126 lb 3.2 oz (57.2 kg), SpO2 100 %. Wt Readings from Last 3 Encounters:  07/02/22 126 lb 3.2 oz (57.2 kg)  04/28/22 132 lb (59.9 kg)  12/20/21 116 lb 6.4 oz (52.8 kg)   Body mass index is 24.65 kg/m. Performance status (ECOG): 1 Physical Exam Constitutional:      Appearance: Normal appearance. She is not ill-appearing.     Comments: She is ambulating with a walker.  She does look weaker versus previous visits.  HENT:     Mouth/Throat:     Mouth: Mucous membranes are moist.     Pharynx: Oropharynx is clear. No oropharyngeal exudate or posterior oropharyngeal erythema.  Cardiovascular:     Rate and Rhythm: Normal rate and regular rhythm.     Heart sounds: No murmur heard.    No friction rub. No gallop.  Pulmonary:     Effort: Pulmonary effort is normal. No respiratory distress.     Breath sounds: Normal breath sounds. No wheezing, rhonchi or rales.  Abdominal:     General: Bowel sounds are normal. There is no distension.     Palpations: Abdomen is  soft. There is no mass.     Tenderness: There is no abdominal tenderness.  Musculoskeletal:        General: No swelling.     Right lower leg: No edema.     Left lower leg: No edema.  Lymphadenopathy:     Cervical: No cervical adenopathy.     Upper Body:     Right upper body: No supraclavicular or axillary adenopathy.     Left upper body: No supraclavicular or axillary adenopathy.     Lower Body: No right inguinal adenopathy. No left inguinal adenopathy.  Skin:    General: Skin is warm.     Coloration: Skin is not jaundiced.     Findings: No lesion or rash.  Neurological:     General: No focal deficit present.     Mental Status: She is alert and oriented to person, place, and time. Mental status is at baseline.  Psychiatric:        Mood and Affect: Mood normal.        Behavior: Behavior normal.        Thought Content: Thought content normal.   LABS:      Latest Ref Rng & Units 07/02/2022   12:00 AM 12/20/2021   12:00 AM 08/23/2021   12:00 AM  CBC  WBC  6.1     7.8     8.1  Hemoglobin 12.0 - 16.0 10.0     10.8     11.1      Hematocrit 36 - 46 28     31     33      Platelets 150 - 400 K/uL 452     496     592         This result is from an external source.     ASSESSMENT & PLAN:  A  Latest Reference Range & Units 07/02/22 13:38  Iron 28 - 170 ug/dL 61  UIBC ug/dL 191  TIBC 250 - 450 ug/dL 252  Saturation Ratios 10.4 - 31.8 % 24  Ferritin 11 - 307 ng/mL 65  Folate >5.9 ng/mL 6.6  Vitamin B12 180 - 914 pg/mL 316   Assessment/Plan:  A 77 y.o. female with essential thrombocythemia.  I am pleased that her platelet count is lower today than what it was at her last visit.  However, her hemoglobin is dropping further.  Based upon this, I do want the patient to decrease her hydroxyurea dose to where she will only take it at 500 mg twice a day on Mondays, Wednesdays, and Fridays.  It will be 500 mg daily for the other 4 days of the week.  Otherwise, I will see this patient  back in 2 months to reassess her peripheral counts. The patient understands all the plans discussed today and is in agreement with them.    Yulieth Carrender Macarthur Critchley, MD

## 2022-07-02 ENCOUNTER — Other Ambulatory Visit: Payer: Self-pay | Admitting: Oncology

## 2022-07-02 ENCOUNTER — Telehealth: Payer: Self-pay

## 2022-07-02 ENCOUNTER — Inpatient Hospital Stay: Payer: Medicare Other | Attending: Oncology | Admitting: Oncology

## 2022-07-02 ENCOUNTER — Inpatient Hospital Stay: Payer: Medicare Other

## 2022-07-02 VITALS — BP 176/83 | HR 85 | Temp 98.6°F | Resp 14 | Ht 60.0 in | Wt 126.2 lb

## 2022-07-02 DIAGNOSIS — D473 Essential (hemorrhagic) thrombocythemia: Secondary | ICD-10-CM | POA: Diagnosis not present

## 2022-07-02 DIAGNOSIS — D649 Anemia, unspecified: Secondary | ICD-10-CM | POA: Diagnosis not present

## 2022-07-02 DIAGNOSIS — I5043 Acute on chronic combined systolic (congestive) and diastolic (congestive) heart failure: Secondary | ICD-10-CM | POA: Diagnosis not present

## 2022-07-02 DIAGNOSIS — D75839 Thrombocytosis, unspecified: Secondary | ICD-10-CM | POA: Diagnosis not present

## 2022-07-02 LAB — CBC: RBC: 1.99 — AB (ref 3.87–5.11)

## 2022-07-02 LAB — IRON AND TIBC
Iron: 61 ug/dL (ref 28–170)
Saturation Ratios: 24 % (ref 10.4–31.8)
TIBC: 252 ug/dL (ref 250–450)
UIBC: 191 ug/dL

## 2022-07-02 LAB — FOLATE: Folate: 6.6 ng/mL (ref >5.9)

## 2022-07-02 LAB — CBC AND DIFFERENTIAL
HCT: 28 — AB (ref 36–46)
Hemoglobin: 10 — AB (ref 12.0–16.0)
Neutrophils Absolute: 5
Platelets: 452 10*3/uL — AB (ref 150–400)
WBC: 6.1

## 2022-07-02 LAB — FERRITIN: Ferritin: 65 ng/mL (ref 11–307)

## 2022-07-02 LAB — VITAMIN B12: Vitamin B-12: 316 pg/mL (ref 180–914)

## 2022-07-02 NOTE — Telephone Encounter (Signed)
Pt notified that as of now '@1609'$ , the results are still pending. She will call back tomorrow.

## 2022-07-03 ENCOUNTER — Ambulatory Visit: Payer: Medicare Other | Admitting: Cardiology

## 2022-07-03 ENCOUNTER — Encounter: Payer: Self-pay | Admitting: Oncology

## 2022-07-03 DIAGNOSIS — E8809 Other disorders of plasma-protein metabolism, not elsewhere classified: Secondary | ICD-10-CM | POA: Diagnosis not present

## 2022-07-04 DIAGNOSIS — G8929 Other chronic pain: Secondary | ICD-10-CM | POA: Diagnosis not present

## 2022-07-04 DIAGNOSIS — E785 Hyperlipidemia, unspecified: Secondary | ICD-10-CM | POA: Diagnosis not present

## 2022-07-04 DIAGNOSIS — I110 Hypertensive heart disease with heart failure: Secondary | ICD-10-CM | POA: Diagnosis not present

## 2022-07-04 DIAGNOSIS — S91301D Unspecified open wound, right foot, subsequent encounter: Secondary | ICD-10-CM | POA: Diagnosis not present

## 2022-07-04 DIAGNOSIS — D509 Iron deficiency anemia, unspecified: Secondary | ICD-10-CM | POA: Diagnosis not present

## 2022-07-04 DIAGNOSIS — K589 Irritable bowel syndrome without diarrhea: Secondary | ICD-10-CM | POA: Diagnosis not present

## 2022-07-04 DIAGNOSIS — F321 Major depressive disorder, single episode, moderate: Secondary | ICD-10-CM | POA: Diagnosis not present

## 2022-07-04 DIAGNOSIS — Z9181 History of falling: Secondary | ICD-10-CM | POA: Diagnosis not present

## 2022-07-04 DIAGNOSIS — R238 Other skin changes: Secondary | ICD-10-CM | POA: Diagnosis not present

## 2022-07-04 DIAGNOSIS — M47896 Other spondylosis, lumbar region: Secondary | ICD-10-CM | POA: Diagnosis not present

## 2022-07-04 DIAGNOSIS — M545 Low back pain, unspecified: Secondary | ICD-10-CM | POA: Diagnosis not present

## 2022-07-04 DIAGNOSIS — G4700 Insomnia, unspecified: Secondary | ICD-10-CM | POA: Diagnosis not present

## 2022-07-04 DIAGNOSIS — Z7982 Long term (current) use of aspirin: Secondary | ICD-10-CM | POA: Diagnosis not present

## 2022-07-04 DIAGNOSIS — F419 Anxiety disorder, unspecified: Secondary | ICD-10-CM | POA: Diagnosis not present

## 2022-07-04 DIAGNOSIS — I5042 Chronic combined systolic (congestive) and diastolic (congestive) heart failure: Secondary | ICD-10-CM | POA: Diagnosis not present

## 2022-07-06 ENCOUNTER — Encounter: Payer: Self-pay | Admitting: Oncology

## 2022-07-08 DIAGNOSIS — I5042 Chronic combined systolic (congestive) and diastolic (congestive) heart failure: Secondary | ICD-10-CM | POA: Diagnosis not present

## 2022-07-08 DIAGNOSIS — M545 Low back pain, unspecified: Secondary | ICD-10-CM | POA: Diagnosis not present

## 2022-07-08 DIAGNOSIS — R238 Other skin changes: Secondary | ICD-10-CM | POA: Diagnosis not present

## 2022-07-08 DIAGNOSIS — G8929 Other chronic pain: Secondary | ICD-10-CM | POA: Diagnosis not present

## 2022-07-08 DIAGNOSIS — I110 Hypertensive heart disease with heart failure: Secondary | ICD-10-CM | POA: Diagnosis not present

## 2022-07-08 DIAGNOSIS — S91301D Unspecified open wound, right foot, subsequent encounter: Secondary | ICD-10-CM | POA: Diagnosis not present

## 2022-07-11 DIAGNOSIS — G8929 Other chronic pain: Secondary | ICD-10-CM | POA: Diagnosis not present

## 2022-07-11 DIAGNOSIS — I110 Hypertensive heart disease with heart failure: Secondary | ICD-10-CM | POA: Diagnosis not present

## 2022-07-11 DIAGNOSIS — S91301D Unspecified open wound, right foot, subsequent encounter: Secondary | ICD-10-CM | POA: Diagnosis not present

## 2022-07-11 DIAGNOSIS — R238 Other skin changes: Secondary | ICD-10-CM | POA: Diagnosis not present

## 2022-07-11 DIAGNOSIS — M545 Low back pain, unspecified: Secondary | ICD-10-CM | POA: Diagnosis not present

## 2022-07-11 DIAGNOSIS — I5042 Chronic combined systolic (congestive) and diastolic (congestive) heart failure: Secondary | ICD-10-CM | POA: Diagnosis not present

## 2022-07-15 DIAGNOSIS — G8929 Other chronic pain: Secondary | ICD-10-CM | POA: Diagnosis not present

## 2022-07-15 DIAGNOSIS — M545 Low back pain, unspecified: Secondary | ICD-10-CM | POA: Diagnosis not present

## 2022-07-15 DIAGNOSIS — I110 Hypertensive heart disease with heart failure: Secondary | ICD-10-CM | POA: Diagnosis not present

## 2022-07-15 DIAGNOSIS — S91301D Unspecified open wound, right foot, subsequent encounter: Secondary | ICD-10-CM | POA: Diagnosis not present

## 2022-07-15 DIAGNOSIS — I5042 Chronic combined systolic (congestive) and diastolic (congestive) heart failure: Secondary | ICD-10-CM | POA: Diagnosis not present

## 2022-07-15 DIAGNOSIS — R238 Other skin changes: Secondary | ICD-10-CM | POA: Diagnosis not present

## 2022-07-18 DIAGNOSIS — M545 Low back pain, unspecified: Secondary | ICD-10-CM | POA: Diagnosis not present

## 2022-07-18 DIAGNOSIS — G8929 Other chronic pain: Secondary | ICD-10-CM | POA: Diagnosis not present

## 2022-07-18 DIAGNOSIS — S91301D Unspecified open wound, right foot, subsequent encounter: Secondary | ICD-10-CM | POA: Diagnosis not present

## 2022-07-18 DIAGNOSIS — I110 Hypertensive heart disease with heart failure: Secondary | ICD-10-CM | POA: Diagnosis not present

## 2022-07-18 DIAGNOSIS — I5042 Chronic combined systolic (congestive) and diastolic (congestive) heart failure: Secondary | ICD-10-CM | POA: Diagnosis not present

## 2022-07-18 DIAGNOSIS — R238 Other skin changes: Secondary | ICD-10-CM | POA: Diagnosis not present

## 2022-07-22 DIAGNOSIS — R238 Other skin changes: Secondary | ICD-10-CM | POA: Diagnosis not present

## 2022-07-22 DIAGNOSIS — S91301D Unspecified open wound, right foot, subsequent encounter: Secondary | ICD-10-CM | POA: Diagnosis not present

## 2022-07-22 DIAGNOSIS — I110 Hypertensive heart disease with heart failure: Secondary | ICD-10-CM | POA: Diagnosis not present

## 2022-07-22 DIAGNOSIS — G8929 Other chronic pain: Secondary | ICD-10-CM | POA: Diagnosis not present

## 2022-07-22 DIAGNOSIS — M545 Low back pain, unspecified: Secondary | ICD-10-CM | POA: Diagnosis not present

## 2022-07-22 DIAGNOSIS — I5042 Chronic combined systolic (congestive) and diastolic (congestive) heart failure: Secondary | ICD-10-CM | POA: Diagnosis not present

## 2022-07-24 ENCOUNTER — Ambulatory Visit: Payer: Medicare Other | Admitting: Cardiology

## 2022-07-25 DIAGNOSIS — I5042 Chronic combined systolic (congestive) and diastolic (congestive) heart failure: Secondary | ICD-10-CM | POA: Diagnosis not present

## 2022-07-25 DIAGNOSIS — R238 Other skin changes: Secondary | ICD-10-CM | POA: Diagnosis not present

## 2022-07-25 DIAGNOSIS — G8929 Other chronic pain: Secondary | ICD-10-CM | POA: Diagnosis not present

## 2022-07-25 DIAGNOSIS — S91301D Unspecified open wound, right foot, subsequent encounter: Secondary | ICD-10-CM | POA: Diagnosis not present

## 2022-07-25 DIAGNOSIS — I110 Hypertensive heart disease with heart failure: Secondary | ICD-10-CM | POA: Diagnosis not present

## 2022-07-25 DIAGNOSIS — M545 Low back pain, unspecified: Secondary | ICD-10-CM | POA: Diagnosis not present

## 2022-07-29 DIAGNOSIS — S91301D Unspecified open wound, right foot, subsequent encounter: Secondary | ICD-10-CM | POA: Diagnosis not present

## 2022-07-29 DIAGNOSIS — M545 Low back pain, unspecified: Secondary | ICD-10-CM | POA: Diagnosis not present

## 2022-07-29 DIAGNOSIS — I110 Hypertensive heart disease with heart failure: Secondary | ICD-10-CM | POA: Diagnosis not present

## 2022-07-29 DIAGNOSIS — G8929 Other chronic pain: Secondary | ICD-10-CM | POA: Diagnosis not present

## 2022-07-29 DIAGNOSIS — I5042 Chronic combined systolic (congestive) and diastolic (congestive) heart failure: Secondary | ICD-10-CM | POA: Diagnosis not present

## 2022-07-29 DIAGNOSIS — R238 Other skin changes: Secondary | ICD-10-CM | POA: Diagnosis not present

## 2022-07-31 DIAGNOSIS — D509 Iron deficiency anemia, unspecified: Secondary | ICD-10-CM | POA: Diagnosis not present

## 2022-07-31 DIAGNOSIS — S91301D Unspecified open wound, right foot, subsequent encounter: Secondary | ICD-10-CM | POA: Diagnosis not present

## 2022-07-31 DIAGNOSIS — R600 Localized edema: Secondary | ICD-10-CM | POA: Diagnosis not present

## 2022-07-31 DIAGNOSIS — I5042 Chronic combined systolic (congestive) and diastolic (congestive) heart failure: Secondary | ICD-10-CM | POA: Diagnosis not present

## 2022-07-31 DIAGNOSIS — I10 Essential (primary) hypertension: Secondary | ICD-10-CM | POA: Diagnosis not present

## 2022-08-01 DIAGNOSIS — R238 Other skin changes: Secondary | ICD-10-CM | POA: Diagnosis not present

## 2022-08-01 DIAGNOSIS — M545 Low back pain, unspecified: Secondary | ICD-10-CM | POA: Diagnosis not present

## 2022-08-01 DIAGNOSIS — S91301D Unspecified open wound, right foot, subsequent encounter: Secondary | ICD-10-CM | POA: Diagnosis not present

## 2022-08-01 DIAGNOSIS — I5042 Chronic combined systolic (congestive) and diastolic (congestive) heart failure: Secondary | ICD-10-CM | POA: Diagnosis not present

## 2022-08-01 DIAGNOSIS — G8929 Other chronic pain: Secondary | ICD-10-CM | POA: Diagnosis not present

## 2022-08-01 DIAGNOSIS — I110 Hypertensive heart disease with heart failure: Secondary | ICD-10-CM | POA: Diagnosis not present

## 2022-08-03 DIAGNOSIS — G8929 Other chronic pain: Secondary | ICD-10-CM | POA: Diagnosis not present

## 2022-08-03 DIAGNOSIS — I5042 Chronic combined systolic (congestive) and diastolic (congestive) heart failure: Secondary | ICD-10-CM | POA: Diagnosis not present

## 2022-08-03 DIAGNOSIS — M47896 Other spondylosis, lumbar region: Secondary | ICD-10-CM | POA: Diagnosis not present

## 2022-08-03 DIAGNOSIS — I110 Hypertensive heart disease with heart failure: Secondary | ICD-10-CM | POA: Diagnosis not present

## 2022-08-03 DIAGNOSIS — F419 Anxiety disorder, unspecified: Secondary | ICD-10-CM | POA: Diagnosis not present

## 2022-08-03 DIAGNOSIS — E785 Hyperlipidemia, unspecified: Secondary | ICD-10-CM | POA: Diagnosis not present

## 2022-08-03 DIAGNOSIS — K589 Irritable bowel syndrome without diarrhea: Secondary | ICD-10-CM | POA: Diagnosis not present

## 2022-08-03 DIAGNOSIS — S91301D Unspecified open wound, right foot, subsequent encounter: Secondary | ICD-10-CM | POA: Diagnosis not present

## 2022-08-03 DIAGNOSIS — Z7982 Long term (current) use of aspirin: Secondary | ICD-10-CM | POA: Diagnosis not present

## 2022-08-03 DIAGNOSIS — M545 Low back pain, unspecified: Secondary | ICD-10-CM | POA: Diagnosis not present

## 2022-08-03 DIAGNOSIS — R238 Other skin changes: Secondary | ICD-10-CM | POA: Diagnosis not present

## 2022-08-03 DIAGNOSIS — F321 Major depressive disorder, single episode, moderate: Secondary | ICD-10-CM | POA: Diagnosis not present

## 2022-08-03 DIAGNOSIS — G4700 Insomnia, unspecified: Secondary | ICD-10-CM | POA: Diagnosis not present

## 2022-08-03 DIAGNOSIS — D509 Iron deficiency anemia, unspecified: Secondary | ICD-10-CM | POA: Diagnosis not present

## 2022-08-03 DIAGNOSIS — Z9181 History of falling: Secondary | ICD-10-CM | POA: Diagnosis not present

## 2022-08-07 DIAGNOSIS — S91301D Unspecified open wound, right foot, subsequent encounter: Secondary | ICD-10-CM | POA: Diagnosis not present

## 2022-08-07 DIAGNOSIS — R238 Other skin changes: Secondary | ICD-10-CM | POA: Diagnosis not present

## 2022-08-07 DIAGNOSIS — M545 Low back pain, unspecified: Secondary | ICD-10-CM | POA: Diagnosis not present

## 2022-08-07 DIAGNOSIS — I110 Hypertensive heart disease with heart failure: Secondary | ICD-10-CM | POA: Diagnosis not present

## 2022-08-07 DIAGNOSIS — I5042 Chronic combined systolic (congestive) and diastolic (congestive) heart failure: Secondary | ICD-10-CM | POA: Diagnosis not present

## 2022-08-07 DIAGNOSIS — G8929 Other chronic pain: Secondary | ICD-10-CM | POA: Diagnosis not present

## 2022-08-14 DIAGNOSIS — R238 Other skin changes: Secondary | ICD-10-CM | POA: Diagnosis not present

## 2022-08-14 DIAGNOSIS — M545 Low back pain, unspecified: Secondary | ICD-10-CM | POA: Diagnosis not present

## 2022-08-14 DIAGNOSIS — G8929 Other chronic pain: Secondary | ICD-10-CM | POA: Diagnosis not present

## 2022-08-14 DIAGNOSIS — S91301D Unspecified open wound, right foot, subsequent encounter: Secondary | ICD-10-CM | POA: Diagnosis not present

## 2022-08-14 DIAGNOSIS — I110 Hypertensive heart disease with heart failure: Secondary | ICD-10-CM | POA: Diagnosis not present

## 2022-08-14 DIAGNOSIS — I5042 Chronic combined systolic (congestive) and diastolic (congestive) heart failure: Secondary | ICD-10-CM | POA: Diagnosis not present

## 2022-08-18 DIAGNOSIS — I110 Hypertensive heart disease with heart failure: Secondary | ICD-10-CM | POA: Diagnosis not present

## 2022-08-18 DIAGNOSIS — G8929 Other chronic pain: Secondary | ICD-10-CM | POA: Diagnosis not present

## 2022-08-18 DIAGNOSIS — M545 Low back pain, unspecified: Secondary | ICD-10-CM | POA: Diagnosis not present

## 2022-08-18 DIAGNOSIS — I5042 Chronic combined systolic (congestive) and diastolic (congestive) heart failure: Secondary | ICD-10-CM | POA: Diagnosis not present

## 2022-08-18 DIAGNOSIS — S91301D Unspecified open wound, right foot, subsequent encounter: Secondary | ICD-10-CM | POA: Diagnosis not present

## 2022-08-18 DIAGNOSIS — R238 Other skin changes: Secondary | ICD-10-CM | POA: Diagnosis not present

## 2022-09-01 DIAGNOSIS — S91301D Unspecified open wound, right foot, subsequent encounter: Secondary | ICD-10-CM | POA: Diagnosis not present

## 2022-09-01 DIAGNOSIS — M545 Low back pain, unspecified: Secondary | ICD-10-CM | POA: Diagnosis not present

## 2022-09-01 DIAGNOSIS — I5042 Chronic combined systolic (congestive) and diastolic (congestive) heart failure: Secondary | ICD-10-CM | POA: Diagnosis not present

## 2022-09-01 DIAGNOSIS — I110 Hypertensive heart disease with heart failure: Secondary | ICD-10-CM | POA: Diagnosis not present

## 2022-09-01 DIAGNOSIS — R238 Other skin changes: Secondary | ICD-10-CM | POA: Diagnosis not present

## 2022-09-01 DIAGNOSIS — G8929 Other chronic pain: Secondary | ICD-10-CM | POA: Diagnosis not present

## 2022-09-02 DIAGNOSIS — D509 Iron deficiency anemia, unspecified: Secondary | ICD-10-CM | POA: Diagnosis not present

## 2022-09-02 DIAGNOSIS — E785 Hyperlipidemia, unspecified: Secondary | ICD-10-CM | POA: Diagnosis not present

## 2022-09-02 DIAGNOSIS — G8929 Other chronic pain: Secondary | ICD-10-CM | POA: Diagnosis not present

## 2022-09-02 DIAGNOSIS — K589 Irritable bowel syndrome without diarrhea: Secondary | ICD-10-CM | POA: Diagnosis not present

## 2022-09-02 DIAGNOSIS — M545 Low back pain, unspecified: Secondary | ICD-10-CM | POA: Diagnosis not present

## 2022-09-02 DIAGNOSIS — F321 Major depressive disorder, single episode, moderate: Secondary | ICD-10-CM | POA: Diagnosis not present

## 2022-09-02 DIAGNOSIS — Z7982 Long term (current) use of aspirin: Secondary | ICD-10-CM | POA: Diagnosis not present

## 2022-09-02 DIAGNOSIS — Z9181 History of falling: Secondary | ICD-10-CM | POA: Diagnosis not present

## 2022-09-02 DIAGNOSIS — I5042 Chronic combined systolic (congestive) and diastolic (congestive) heart failure: Secondary | ICD-10-CM | POA: Diagnosis not present

## 2022-09-02 DIAGNOSIS — F419 Anxiety disorder, unspecified: Secondary | ICD-10-CM | POA: Diagnosis not present

## 2022-09-02 DIAGNOSIS — M47896 Other spondylosis, lumbar region: Secondary | ICD-10-CM | POA: Diagnosis not present

## 2022-09-02 DIAGNOSIS — I110 Hypertensive heart disease with heart failure: Secondary | ICD-10-CM | POA: Diagnosis not present

## 2022-09-02 DIAGNOSIS — G4700 Insomnia, unspecified: Secondary | ICD-10-CM | POA: Diagnosis not present

## 2022-09-04 DIAGNOSIS — F419 Anxiety disorder, unspecified: Secondary | ICD-10-CM | POA: Diagnosis not present

## 2022-09-04 DIAGNOSIS — G8929 Other chronic pain: Secondary | ICD-10-CM | POA: Diagnosis not present

## 2022-09-04 DIAGNOSIS — I5042 Chronic combined systolic (congestive) and diastolic (congestive) heart failure: Secondary | ICD-10-CM | POA: Diagnosis not present

## 2022-09-04 DIAGNOSIS — M47896 Other spondylosis, lumbar region: Secondary | ICD-10-CM | POA: Diagnosis not present

## 2022-09-04 DIAGNOSIS — I110 Hypertensive heart disease with heart failure: Secondary | ICD-10-CM | POA: Diagnosis not present

## 2022-09-04 DIAGNOSIS — M545 Low back pain, unspecified: Secondary | ICD-10-CM | POA: Diagnosis not present

## 2022-09-04 NOTE — Progress Notes (Signed)
[facility]  [address] [place],  [place]  [address] [phone]  Clinic Day:  09/05/2022  Referring physician: Hurshel Party, NP  HISTORY OF PRESENT ILLNESS:  The patient is a 77 y.o. female with essential thrombocythemia.  She takes hydroxyurea 500 mg twice daily on Mondays, Wednesdays, and Fridays; she takes hydroxyurea 500 mg daily for the other 4 days a week.  Her hydroxyurea dose was changed at her last visit due to it causing significant cytopenias at higher doses.  She comes in today to reassess her peripheral counts.  Since her last visit, the patient has been doing okay.  She continues to deny having any clotting complications from her underlying essential thrombocythemia.    PHYSICAL EXAM:  Blood pressure (!) 201/90, pulse 85, temperature 98.3 F (36.8 C), resp. rate 14, height 5' (1.524 m), weight 112 lb 6.4 oz (51 kg), SpO2 93 %. Wt Readings from Last 3 Encounters:  09/05/22 112 lb 6.4 oz (51 kg)  07/02/22 126 lb 3.2 oz (57.2 kg)  04/28/22 132 lb (59.9 kg)   Body mass index is 21.95 kg/m. Performance status (ECOG): 1 Physical Exam Constitutional:      Appearance: Normal appearance. She is not ill-appearing.     Comments: She is ambulating with a walker.  She does look weaker versus previous visits.  HENT:     Mouth/Throat:     Mouth: Mucous membranes are moist.     Pharynx: Oropharynx is clear. No oropharyngeal exudate or posterior oropharyngeal erythema.  Cardiovascular:     Rate and Rhythm: Normal rate and regular rhythm.     Heart sounds: No murmur heard.    No friction rub. No gallop.  Pulmonary:     Effort: Pulmonary effort is normal. No respiratory distress.     Breath sounds: Normal breath sounds. No wheezing, rhonchi or rales.  Abdominal:     General: Bowel sounds are normal. There is no distension.     Palpations: Abdomen is soft. There is no mass.     Tenderness: There is no abdominal tenderness.  Musculoskeletal:         General: No swelling.     Right lower leg: No edema.     Left lower leg: No edema.  Lymphadenopathy:     Cervical: No cervical adenopathy.     Upper Body:     Right upper body: No supraclavicular or axillary adenopathy.     Left upper body: No supraclavicular or axillary adenopathy.     Lower Body: No right inguinal adenopathy. No left inguinal adenopathy.  Skin:    General: Skin is warm.     Coloration: Skin is not jaundiced.     Findings: No lesion or rash.  Neurological:     General: No focal deficit present.     Mental Status: She is alert and oriented to person, place, and time. Mental status is at baseline.  Psychiatric:        Mood and Affect: Mood normal.        Behavior: Behavior normal.        Thought Content: Thought content normal.    LABS:      Latest Ref Rng & Units 09/05/2022   12:00 AM 07/02/2022   12:00 AM 12/20/2021   12:00 AM  CBC  WBC  7.8     6.1     7.8      Hemoglobin 12.0 - 16.0 11.3     10.0  10.8      Hematocrit 36 - 46 34     28     31      Platelets 150 - 400 K/uL 553     452     496         This result is from an external source.   ASSESSMENT & PLAN:  Assessment/Plan:  A 77 y.o. female with essential thrombocythemia.  I am pleased that her platelet count remains below 600 with a recent change in her hydroxyurea dose.  I am also pleased that her hemoglobin has risen.  Based upon these improvements, her hydroxyurea dose will remain the same to where she will take it at 500 mg twice a day on Mondays, Wednesdays, and Fridays.  It will be 500 mg daily for the other 4 days of the week.  Otherwise, I will see this patient back in 4 months to reassess her peripheral counts. The patient understands all the plans discussed today and is in agreement with them.    Kashten Gowin Kirby Funk, MD

## 2022-09-05 ENCOUNTER — Inpatient Hospital Stay: Payer: Medicare Other | Attending: Oncology | Admitting: Oncology

## 2022-09-05 ENCOUNTER — Inpatient Hospital Stay: Payer: Medicare Other

## 2022-09-05 ENCOUNTER — Other Ambulatory Visit: Payer: Self-pay | Admitting: Oncology

## 2022-09-05 VITALS — BP 201/90 | HR 85 | Temp 98.3°F | Resp 14 | Ht 60.0 in | Wt 112.4 lb

## 2022-09-05 DIAGNOSIS — D649 Anemia, unspecified: Secondary | ICD-10-CM | POA: Diagnosis not present

## 2022-09-05 DIAGNOSIS — D473 Essential (hemorrhagic) thrombocythemia: Secondary | ICD-10-CM

## 2022-09-05 LAB — CBC AND DIFFERENTIAL
HCT: 34 — AB (ref 36–46)
Hemoglobin: 11.3 — AB (ref 12.0–16.0)
Neutrophils Absolute: 6.4
Platelets: 553 10*3/uL — AB (ref 150–400)
WBC: 7.8

## 2022-09-05 LAB — CBC: RBC: 2.65 — AB (ref 3.87–5.11)

## 2022-09-05 NOTE — Progress Notes (Signed)
Receptionist came to back to advise that Daughter is with patient in waiting room.  Patient has been having slurred speech, one sided weakness, difficulty recalling words.  Daughter is getting ready to go on vacation and she states that her mother gets like this every time she is leaving.

## 2022-09-09 DIAGNOSIS — I499 Cardiac arrhythmia, unspecified: Secondary | ICD-10-CM | POA: Diagnosis not present

## 2022-09-09 DIAGNOSIS — D473 Essential (hemorrhagic) thrombocythemia: Secondary | ICD-10-CM | POA: Diagnosis not present

## 2022-09-09 DIAGNOSIS — I10 Essential (primary) hypertension: Secondary | ICD-10-CM | POA: Diagnosis not present

## 2022-09-09 DIAGNOSIS — R531 Weakness: Secondary | ICD-10-CM | POA: Diagnosis not present

## 2022-09-09 DIAGNOSIS — M47896 Other spondylosis, lumbar region: Secondary | ICD-10-CM | POA: Diagnosis not present

## 2022-09-09 DIAGNOSIS — G8929 Other chronic pain: Secondary | ICD-10-CM | POA: Diagnosis not present

## 2022-09-09 DIAGNOSIS — F419 Anxiety disorder, unspecified: Secondary | ICD-10-CM | POA: Diagnosis not present

## 2022-09-09 DIAGNOSIS — M25552 Pain in left hip: Secondary | ICD-10-CM | POA: Diagnosis not present

## 2022-09-09 DIAGNOSIS — I110 Hypertensive heart disease with heart failure: Secondary | ICD-10-CM | POA: Diagnosis not present

## 2022-09-09 DIAGNOSIS — M545 Low back pain, unspecified: Secondary | ICD-10-CM | POA: Diagnosis not present

## 2022-09-09 DIAGNOSIS — I5042 Chronic combined systolic (congestive) and diastolic (congestive) heart failure: Secondary | ICD-10-CM | POA: Diagnosis not present

## 2022-09-16 DIAGNOSIS — F419 Anxiety disorder, unspecified: Secondary | ICD-10-CM | POA: Diagnosis not present

## 2022-09-16 DIAGNOSIS — I5042 Chronic combined systolic (congestive) and diastolic (congestive) heart failure: Secondary | ICD-10-CM | POA: Diagnosis not present

## 2022-09-16 DIAGNOSIS — M545 Low back pain, unspecified: Secondary | ICD-10-CM | POA: Diagnosis not present

## 2022-09-16 DIAGNOSIS — G8929 Other chronic pain: Secondary | ICD-10-CM | POA: Diagnosis not present

## 2022-09-16 DIAGNOSIS — M47896 Other spondylosis, lumbar region: Secondary | ICD-10-CM | POA: Diagnosis not present

## 2022-09-16 DIAGNOSIS — I110 Hypertensive heart disease with heart failure: Secondary | ICD-10-CM | POA: Diagnosis not present

## 2022-09-23 DIAGNOSIS — M545 Low back pain, unspecified: Secondary | ICD-10-CM | POA: Diagnosis not present

## 2022-09-23 DIAGNOSIS — I5042 Chronic combined systolic (congestive) and diastolic (congestive) heart failure: Secondary | ICD-10-CM | POA: Diagnosis not present

## 2022-09-23 DIAGNOSIS — G8929 Other chronic pain: Secondary | ICD-10-CM | POA: Diagnosis not present

## 2022-09-23 DIAGNOSIS — M47896 Other spondylosis, lumbar region: Secondary | ICD-10-CM | POA: Diagnosis not present

## 2022-09-23 DIAGNOSIS — I110 Hypertensive heart disease with heart failure: Secondary | ICD-10-CM | POA: Diagnosis not present

## 2022-09-23 DIAGNOSIS — F419 Anxiety disorder, unspecified: Secondary | ICD-10-CM | POA: Diagnosis not present

## 2022-09-25 DIAGNOSIS — S7012XA Contusion of left thigh, initial encounter: Secondary | ICD-10-CM | POA: Diagnosis not present

## 2022-09-25 DIAGNOSIS — E039 Hypothyroidism, unspecified: Secondary | ICD-10-CM | POA: Diagnosis not present

## 2022-09-25 DIAGNOSIS — I10 Essential (primary) hypertension: Secondary | ICD-10-CM | POA: Diagnosis not present

## 2022-09-25 DIAGNOSIS — I499 Cardiac arrhythmia, unspecified: Secondary | ICD-10-CM | POA: Diagnosis not present

## 2022-09-29 DIAGNOSIS — G459 Transient cerebral ischemic attack, unspecified: Secondary | ICD-10-CM | POA: Diagnosis not present

## 2022-09-29 DIAGNOSIS — I6523 Occlusion and stenosis of bilateral carotid arteries: Secondary | ICD-10-CM | POA: Diagnosis not present

## 2022-09-29 DIAGNOSIS — I10 Essential (primary) hypertension: Secondary | ICD-10-CM | POA: Diagnosis not present

## 2022-09-29 DIAGNOSIS — R531 Weakness: Secondary | ICD-10-CM | POA: Diagnosis not present

## 2022-09-29 DIAGNOSIS — E039 Hypothyroidism, unspecified: Secondary | ICD-10-CM | POA: Diagnosis not present

## 2022-09-29 DIAGNOSIS — R519 Headache, unspecified: Secondary | ICD-10-CM | POA: Diagnosis not present

## 2022-09-29 DIAGNOSIS — Z8673 Personal history of transient ischemic attack (TIA), and cerebral infarction without residual deficits: Secondary | ICD-10-CM | POA: Diagnosis not present

## 2022-09-29 DIAGNOSIS — R079 Chest pain, unspecified: Secondary | ICD-10-CM | POA: Diagnosis not present

## 2022-09-29 DIAGNOSIS — R2689 Other abnormalities of gait and mobility: Secondary | ICD-10-CM | POA: Diagnosis not present

## 2022-09-29 DIAGNOSIS — Z7982 Long term (current) use of aspirin: Secondary | ICD-10-CM | POA: Diagnosis not present

## 2022-09-29 DIAGNOSIS — R4701 Aphasia: Secondary | ICD-10-CM | POA: Diagnosis not present

## 2022-09-29 DIAGNOSIS — Z79899 Other long term (current) drug therapy: Secondary | ICD-10-CM | POA: Diagnosis not present

## 2022-09-29 DIAGNOSIS — E876 Hypokalemia: Secondary | ICD-10-CM | POA: Diagnosis not present

## 2022-09-29 DIAGNOSIS — R2681 Unsteadiness on feet: Secondary | ICD-10-CM | POA: Diagnosis not present

## 2022-09-29 DIAGNOSIS — R4182 Altered mental status, unspecified: Secondary | ICD-10-CM | POA: Diagnosis not present

## 2022-09-29 DIAGNOSIS — D473 Essential (hemorrhagic) thrombocythemia: Secondary | ICD-10-CM | POA: Diagnosis not present

## 2022-09-29 DIAGNOSIS — I6782 Cerebral ischemia: Secondary | ICD-10-CM | POA: Diagnosis not present

## 2022-09-29 DIAGNOSIS — R9431 Abnormal electrocardiogram [ECG] [EKG]: Secondary | ICD-10-CM | POA: Diagnosis not present

## 2022-09-29 DIAGNOSIS — R2990 Unspecified symptoms and signs involving the nervous system: Secondary | ICD-10-CM | POA: Diagnosis not present

## 2022-09-29 DIAGNOSIS — D649 Anemia, unspecified: Secondary | ICD-10-CM | POA: Diagnosis not present

## 2022-09-30 DIAGNOSIS — E039 Hypothyroidism, unspecified: Secondary | ICD-10-CM | POA: Diagnosis not present

## 2022-09-30 DIAGNOSIS — G459 Transient cerebral ischemic attack, unspecified: Secondary | ICD-10-CM | POA: Diagnosis not present

## 2022-09-30 DIAGNOSIS — I10 Essential (primary) hypertension: Secondary | ICD-10-CM | POA: Diagnosis not present

## 2022-09-30 DIAGNOSIS — R519 Headache, unspecified: Secondary | ICD-10-CM | POA: Diagnosis not present

## 2022-10-02 DIAGNOSIS — E785 Hyperlipidemia, unspecified: Secondary | ICD-10-CM | POA: Diagnosis not present

## 2022-10-02 DIAGNOSIS — K589 Irritable bowel syndrome without diarrhea: Secondary | ICD-10-CM | POA: Diagnosis not present

## 2022-10-02 DIAGNOSIS — Z9181 History of falling: Secondary | ICD-10-CM | POA: Diagnosis not present

## 2022-10-02 DIAGNOSIS — M545 Low back pain, unspecified: Secondary | ICD-10-CM | POA: Diagnosis not present

## 2022-10-02 DIAGNOSIS — F419 Anxiety disorder, unspecified: Secondary | ICD-10-CM | POA: Diagnosis not present

## 2022-10-02 DIAGNOSIS — I110 Hypertensive heart disease with heart failure: Secondary | ICD-10-CM | POA: Diagnosis not present

## 2022-10-02 DIAGNOSIS — M47896 Other spondylosis, lumbar region: Secondary | ICD-10-CM | POA: Diagnosis not present

## 2022-10-02 DIAGNOSIS — I5042 Chronic combined systolic (congestive) and diastolic (congestive) heart failure: Secondary | ICD-10-CM | POA: Diagnosis not present

## 2022-10-02 DIAGNOSIS — F321 Major depressive disorder, single episode, moderate: Secondary | ICD-10-CM | POA: Diagnosis not present

## 2022-10-02 DIAGNOSIS — Z7982 Long term (current) use of aspirin: Secondary | ICD-10-CM | POA: Diagnosis not present

## 2022-10-02 DIAGNOSIS — G8929 Other chronic pain: Secondary | ICD-10-CM | POA: Diagnosis not present

## 2022-10-02 DIAGNOSIS — G4700 Insomnia, unspecified: Secondary | ICD-10-CM | POA: Diagnosis not present

## 2022-10-02 DIAGNOSIS — D509 Iron deficiency anemia, unspecified: Secondary | ICD-10-CM | POA: Diagnosis not present

## 2022-10-03 DIAGNOSIS — M545 Low back pain, unspecified: Secondary | ICD-10-CM | POA: Diagnosis not present

## 2022-10-03 DIAGNOSIS — M47896 Other spondylosis, lumbar region: Secondary | ICD-10-CM | POA: Diagnosis not present

## 2022-10-03 DIAGNOSIS — I5042 Chronic combined systolic (congestive) and diastolic (congestive) heart failure: Secondary | ICD-10-CM | POA: Diagnosis not present

## 2022-10-03 DIAGNOSIS — I110 Hypertensive heart disease with heart failure: Secondary | ICD-10-CM | POA: Diagnosis not present

## 2022-10-03 DIAGNOSIS — F419 Anxiety disorder, unspecified: Secondary | ICD-10-CM | POA: Diagnosis not present

## 2022-10-03 DIAGNOSIS — G8929 Other chronic pain: Secondary | ICD-10-CM | POA: Diagnosis not present

## 2022-10-07 DIAGNOSIS — M47896 Other spondylosis, lumbar region: Secondary | ICD-10-CM | POA: Diagnosis not present

## 2022-10-07 DIAGNOSIS — F419 Anxiety disorder, unspecified: Secondary | ICD-10-CM | POA: Diagnosis not present

## 2022-10-07 DIAGNOSIS — M545 Low back pain, unspecified: Secondary | ICD-10-CM | POA: Diagnosis not present

## 2022-10-07 DIAGNOSIS — I5042 Chronic combined systolic (congestive) and diastolic (congestive) heart failure: Secondary | ICD-10-CM | POA: Diagnosis not present

## 2022-10-07 DIAGNOSIS — G8929 Other chronic pain: Secondary | ICD-10-CM | POA: Diagnosis not present

## 2022-10-07 DIAGNOSIS — I110 Hypertensive heart disease with heart failure: Secondary | ICD-10-CM | POA: Diagnosis not present

## 2022-10-09 DIAGNOSIS — M47896 Other spondylosis, lumbar region: Secondary | ICD-10-CM | POA: Diagnosis not present

## 2022-10-09 DIAGNOSIS — I5042 Chronic combined systolic (congestive) and diastolic (congestive) heart failure: Secondary | ICD-10-CM | POA: Diagnosis not present

## 2022-10-09 DIAGNOSIS — F419 Anxiety disorder, unspecified: Secondary | ICD-10-CM | POA: Diagnosis not present

## 2022-10-09 DIAGNOSIS — M545 Low back pain, unspecified: Secondary | ICD-10-CM | POA: Diagnosis not present

## 2022-10-09 DIAGNOSIS — I110 Hypertensive heart disease with heart failure: Secondary | ICD-10-CM | POA: Diagnosis not present

## 2022-10-09 DIAGNOSIS — G8929 Other chronic pain: Secondary | ICD-10-CM | POA: Diagnosis not present

## 2022-10-10 DIAGNOSIS — M545 Low back pain, unspecified: Secondary | ICD-10-CM | POA: Diagnosis not present

## 2022-10-10 DIAGNOSIS — I5042 Chronic combined systolic (congestive) and diastolic (congestive) heart failure: Secondary | ICD-10-CM | POA: Diagnosis not present

## 2022-10-10 DIAGNOSIS — F419 Anxiety disorder, unspecified: Secondary | ICD-10-CM | POA: Diagnosis not present

## 2022-10-10 DIAGNOSIS — I110 Hypertensive heart disease with heart failure: Secondary | ICD-10-CM | POA: Diagnosis not present

## 2022-10-10 DIAGNOSIS — G8929 Other chronic pain: Secondary | ICD-10-CM | POA: Diagnosis not present

## 2022-10-10 DIAGNOSIS — M47896 Other spondylosis, lumbar region: Secondary | ICD-10-CM | POA: Diagnosis not present

## 2022-10-13 DIAGNOSIS — I5042 Chronic combined systolic (congestive) and diastolic (congestive) heart failure: Secondary | ICD-10-CM | POA: Diagnosis not present

## 2022-10-13 DIAGNOSIS — F419 Anxiety disorder, unspecified: Secondary | ICD-10-CM | POA: Diagnosis not present

## 2022-10-13 DIAGNOSIS — I110 Hypertensive heart disease with heart failure: Secondary | ICD-10-CM | POA: Diagnosis not present

## 2022-10-13 DIAGNOSIS — M545 Low back pain, unspecified: Secondary | ICD-10-CM | POA: Diagnosis not present

## 2022-10-13 DIAGNOSIS — M47896 Other spondylosis, lumbar region: Secondary | ICD-10-CM | POA: Diagnosis not present

## 2022-10-13 DIAGNOSIS — G8929 Other chronic pain: Secondary | ICD-10-CM | POA: Diagnosis not present

## 2022-10-14 ENCOUNTER — Ambulatory Visit: Payer: Medicare Other | Admitting: Cardiology

## 2022-10-16 DIAGNOSIS — I5042 Chronic combined systolic (congestive) and diastolic (congestive) heart failure: Secondary | ICD-10-CM | POA: Diagnosis not present

## 2022-10-16 DIAGNOSIS — G8929 Other chronic pain: Secondary | ICD-10-CM | POA: Diagnosis not present

## 2022-10-16 DIAGNOSIS — M47896 Other spondylosis, lumbar region: Secondary | ICD-10-CM | POA: Diagnosis not present

## 2022-10-16 DIAGNOSIS — F419 Anxiety disorder, unspecified: Secondary | ICD-10-CM | POA: Diagnosis not present

## 2022-10-16 DIAGNOSIS — I110 Hypertensive heart disease with heart failure: Secondary | ICD-10-CM | POA: Diagnosis not present

## 2022-10-16 DIAGNOSIS — M545 Low back pain, unspecified: Secondary | ICD-10-CM | POA: Diagnosis not present

## 2022-10-21 DIAGNOSIS — M545 Low back pain, unspecified: Secondary | ICD-10-CM | POA: Diagnosis not present

## 2022-10-21 DIAGNOSIS — I5042 Chronic combined systolic (congestive) and diastolic (congestive) heart failure: Secondary | ICD-10-CM | POA: Diagnosis not present

## 2022-10-21 DIAGNOSIS — G8929 Other chronic pain: Secondary | ICD-10-CM | POA: Diagnosis not present

## 2022-10-21 DIAGNOSIS — F419 Anxiety disorder, unspecified: Secondary | ICD-10-CM | POA: Diagnosis not present

## 2022-10-21 DIAGNOSIS — M47896 Other spondylosis, lumbar region: Secondary | ICD-10-CM | POA: Diagnosis not present

## 2022-10-21 DIAGNOSIS — I110 Hypertensive heart disease with heart failure: Secondary | ICD-10-CM | POA: Diagnosis not present

## 2022-10-23 DIAGNOSIS — I10 Essential (primary) hypertension: Secondary | ICD-10-CM | POA: Diagnosis not present

## 2022-10-23 DIAGNOSIS — J309 Allergic rhinitis, unspecified: Secondary | ICD-10-CM | POA: Diagnosis not present

## 2022-10-23 DIAGNOSIS — S7012XD Contusion of left thigh, subsequent encounter: Secondary | ICD-10-CM | POA: Diagnosis not present

## 2022-10-23 DIAGNOSIS — I499 Cardiac arrhythmia, unspecified: Secondary | ICD-10-CM | POA: Diagnosis not present

## 2022-10-24 DIAGNOSIS — M545 Low back pain, unspecified: Secondary | ICD-10-CM | POA: Diagnosis not present

## 2022-10-24 DIAGNOSIS — I5042 Chronic combined systolic (congestive) and diastolic (congestive) heart failure: Secondary | ICD-10-CM | POA: Diagnosis not present

## 2022-10-24 DIAGNOSIS — I110 Hypertensive heart disease with heart failure: Secondary | ICD-10-CM | POA: Diagnosis not present

## 2022-10-24 DIAGNOSIS — F419 Anxiety disorder, unspecified: Secondary | ICD-10-CM | POA: Diagnosis not present

## 2022-10-24 DIAGNOSIS — M47896 Other spondylosis, lumbar region: Secondary | ICD-10-CM | POA: Diagnosis not present

## 2022-10-24 DIAGNOSIS — G8929 Other chronic pain: Secondary | ICD-10-CM | POA: Diagnosis not present

## 2022-10-28 DIAGNOSIS — G8929 Other chronic pain: Secondary | ICD-10-CM | POA: Diagnosis not present

## 2022-10-28 DIAGNOSIS — M47896 Other spondylosis, lumbar region: Secondary | ICD-10-CM | POA: Diagnosis not present

## 2022-10-28 DIAGNOSIS — M545 Low back pain, unspecified: Secondary | ICD-10-CM | POA: Diagnosis not present

## 2022-10-28 DIAGNOSIS — I110 Hypertensive heart disease with heart failure: Secondary | ICD-10-CM | POA: Diagnosis not present

## 2022-10-28 DIAGNOSIS — I5042 Chronic combined systolic (congestive) and diastolic (congestive) heart failure: Secondary | ICD-10-CM | POA: Diagnosis not present

## 2022-10-28 DIAGNOSIS — F419 Anxiety disorder, unspecified: Secondary | ICD-10-CM | POA: Diagnosis not present

## 2022-10-31 DIAGNOSIS — G8929 Other chronic pain: Secondary | ICD-10-CM | POA: Diagnosis not present

## 2022-10-31 DIAGNOSIS — M47896 Other spondylosis, lumbar region: Secondary | ICD-10-CM | POA: Diagnosis not present

## 2022-10-31 DIAGNOSIS — F419 Anxiety disorder, unspecified: Secondary | ICD-10-CM | POA: Diagnosis not present

## 2022-10-31 DIAGNOSIS — I5042 Chronic combined systolic (congestive) and diastolic (congestive) heart failure: Secondary | ICD-10-CM | POA: Diagnosis not present

## 2022-10-31 DIAGNOSIS — I110 Hypertensive heart disease with heart failure: Secondary | ICD-10-CM | POA: Diagnosis not present

## 2022-10-31 DIAGNOSIS — M545 Low back pain, unspecified: Secondary | ICD-10-CM | POA: Diagnosis not present

## 2022-11-01 DIAGNOSIS — I5042 Chronic combined systolic (congestive) and diastolic (congestive) heart failure: Secondary | ICD-10-CM | POA: Diagnosis not present

## 2022-11-01 DIAGNOSIS — Z9181 History of falling: Secondary | ICD-10-CM | POA: Diagnosis not present

## 2022-11-01 DIAGNOSIS — D509 Iron deficiency anemia, unspecified: Secondary | ICD-10-CM | POA: Diagnosis not present

## 2022-11-01 DIAGNOSIS — F321 Major depressive disorder, single episode, moderate: Secondary | ICD-10-CM | POA: Diagnosis not present

## 2022-11-01 DIAGNOSIS — K589 Irritable bowel syndrome without diarrhea: Secondary | ICD-10-CM | POA: Diagnosis not present

## 2022-11-01 DIAGNOSIS — M47896 Other spondylosis, lumbar region: Secondary | ICD-10-CM | POA: Diagnosis not present

## 2022-11-01 DIAGNOSIS — G4700 Insomnia, unspecified: Secondary | ICD-10-CM | POA: Diagnosis not present

## 2022-11-01 DIAGNOSIS — Z7982 Long term (current) use of aspirin: Secondary | ICD-10-CM | POA: Diagnosis not present

## 2022-11-01 DIAGNOSIS — E785 Hyperlipidemia, unspecified: Secondary | ICD-10-CM | POA: Diagnosis not present

## 2022-11-01 DIAGNOSIS — I110 Hypertensive heart disease with heart failure: Secondary | ICD-10-CM | POA: Diagnosis not present

## 2022-11-01 DIAGNOSIS — M545 Low back pain, unspecified: Secondary | ICD-10-CM | POA: Diagnosis not present

## 2022-11-01 DIAGNOSIS — F419 Anxiety disorder, unspecified: Secondary | ICD-10-CM | POA: Diagnosis not present

## 2022-11-01 DIAGNOSIS — G8929 Other chronic pain: Secondary | ICD-10-CM | POA: Diagnosis not present

## 2022-11-07 DIAGNOSIS — M47896 Other spondylosis, lumbar region: Secondary | ICD-10-CM | POA: Diagnosis not present

## 2022-11-07 DIAGNOSIS — I5042 Chronic combined systolic (congestive) and diastolic (congestive) heart failure: Secondary | ICD-10-CM | POA: Diagnosis not present

## 2022-11-07 DIAGNOSIS — M545 Low back pain, unspecified: Secondary | ICD-10-CM | POA: Diagnosis not present

## 2022-11-07 DIAGNOSIS — G8929 Other chronic pain: Secondary | ICD-10-CM | POA: Diagnosis not present

## 2022-11-07 DIAGNOSIS — I110 Hypertensive heart disease with heart failure: Secondary | ICD-10-CM | POA: Diagnosis not present

## 2022-11-07 DIAGNOSIS — F419 Anxiety disorder, unspecified: Secondary | ICD-10-CM | POA: Diagnosis not present

## 2022-11-07 DIAGNOSIS — E876 Hypokalemia: Secondary | ICD-10-CM | POA: Diagnosis not present

## 2022-11-10 DIAGNOSIS — I110 Hypertensive heart disease with heart failure: Secondary | ICD-10-CM | POA: Diagnosis not present

## 2022-11-10 DIAGNOSIS — G8929 Other chronic pain: Secondary | ICD-10-CM | POA: Diagnosis not present

## 2022-11-10 DIAGNOSIS — I5042 Chronic combined systolic (congestive) and diastolic (congestive) heart failure: Secondary | ICD-10-CM | POA: Diagnosis not present

## 2022-11-10 DIAGNOSIS — F419 Anxiety disorder, unspecified: Secondary | ICD-10-CM | POA: Diagnosis not present

## 2022-11-10 DIAGNOSIS — M545 Low back pain, unspecified: Secondary | ICD-10-CM | POA: Diagnosis not present

## 2022-11-10 DIAGNOSIS — M47896 Other spondylosis, lumbar region: Secondary | ICD-10-CM | POA: Diagnosis not present

## 2022-11-17 DIAGNOSIS — I5042 Chronic combined systolic (congestive) and diastolic (congestive) heart failure: Secondary | ICD-10-CM | POA: Diagnosis not present

## 2022-11-17 DIAGNOSIS — G8929 Other chronic pain: Secondary | ICD-10-CM | POA: Diagnosis not present

## 2022-11-17 DIAGNOSIS — F419 Anxiety disorder, unspecified: Secondary | ICD-10-CM | POA: Diagnosis not present

## 2022-11-17 DIAGNOSIS — M47896 Other spondylosis, lumbar region: Secondary | ICD-10-CM | POA: Diagnosis not present

## 2022-11-17 DIAGNOSIS — M545 Low back pain, unspecified: Secondary | ICD-10-CM | POA: Diagnosis not present

## 2022-11-17 DIAGNOSIS — I110 Hypertensive heart disease with heart failure: Secondary | ICD-10-CM | POA: Diagnosis not present

## 2022-11-24 DIAGNOSIS — M545 Low back pain, unspecified: Secondary | ICD-10-CM | POA: Diagnosis not present

## 2022-11-24 DIAGNOSIS — M47896 Other spondylosis, lumbar region: Secondary | ICD-10-CM | POA: Diagnosis not present

## 2022-11-24 DIAGNOSIS — I5042 Chronic combined systolic (congestive) and diastolic (congestive) heart failure: Secondary | ICD-10-CM | POA: Diagnosis not present

## 2022-11-24 DIAGNOSIS — F419 Anxiety disorder, unspecified: Secondary | ICD-10-CM | POA: Diagnosis not present

## 2022-11-24 DIAGNOSIS — I110 Hypertensive heart disease with heart failure: Secondary | ICD-10-CM | POA: Diagnosis not present

## 2022-11-24 DIAGNOSIS — G8929 Other chronic pain: Secondary | ICD-10-CM | POA: Diagnosis not present

## 2022-12-01 DIAGNOSIS — F321 Major depressive disorder, single episode, moderate: Secondary | ICD-10-CM | POA: Diagnosis not present

## 2022-12-01 DIAGNOSIS — I5042 Chronic combined systolic (congestive) and diastolic (congestive) heart failure: Secondary | ICD-10-CM | POA: Diagnosis not present

## 2022-12-01 DIAGNOSIS — I110 Hypertensive heart disease with heart failure: Secondary | ICD-10-CM | POA: Diagnosis not present

## 2022-12-01 DIAGNOSIS — Z7982 Long term (current) use of aspirin: Secondary | ICD-10-CM | POA: Diagnosis not present

## 2022-12-01 DIAGNOSIS — K589 Irritable bowel syndrome without diarrhea: Secondary | ICD-10-CM | POA: Diagnosis not present

## 2022-12-01 DIAGNOSIS — Z9181 History of falling: Secondary | ICD-10-CM | POA: Diagnosis not present

## 2022-12-01 DIAGNOSIS — M545 Low back pain, unspecified: Secondary | ICD-10-CM | POA: Diagnosis not present

## 2022-12-01 DIAGNOSIS — F419 Anxiety disorder, unspecified: Secondary | ICD-10-CM | POA: Diagnosis not present

## 2022-12-01 DIAGNOSIS — E785 Hyperlipidemia, unspecified: Secondary | ICD-10-CM | POA: Diagnosis not present

## 2022-12-01 DIAGNOSIS — M47896 Other spondylosis, lumbar region: Secondary | ICD-10-CM | POA: Diagnosis not present

## 2022-12-01 DIAGNOSIS — G4700 Insomnia, unspecified: Secondary | ICD-10-CM | POA: Diagnosis not present

## 2022-12-01 DIAGNOSIS — D509 Iron deficiency anemia, unspecified: Secondary | ICD-10-CM | POA: Diagnosis not present

## 2022-12-01 DIAGNOSIS — G8929 Other chronic pain: Secondary | ICD-10-CM | POA: Diagnosis not present

## 2022-12-18 DIAGNOSIS — I471 Supraventricular tachycardia, unspecified: Secondary | ICD-10-CM | POA: Diagnosis not present

## 2022-12-18 DIAGNOSIS — F419 Anxiety disorder, unspecified: Secondary | ICD-10-CM | POA: Diagnosis not present

## 2022-12-18 DIAGNOSIS — K589 Irritable bowel syndrome without diarrhea: Secondary | ICD-10-CM | POA: Diagnosis not present

## 2022-12-18 DIAGNOSIS — R531 Weakness: Secondary | ICD-10-CM | POA: Diagnosis not present

## 2022-12-18 DIAGNOSIS — I10 Essential (primary) hypertension: Secondary | ICD-10-CM | POA: Diagnosis not present

## 2022-12-23 ENCOUNTER — Ambulatory Visit: Payer: Medicare Other | Attending: Cardiology | Admitting: Cardiology

## 2022-12-23 ENCOUNTER — Encounter: Payer: Self-pay | Admitting: Cardiology

## 2022-12-23 VITALS — BP 170/90 | HR 83 | Ht 60.0 in | Wt 113.8 lb

## 2022-12-23 DIAGNOSIS — E785 Hyperlipidemia, unspecified: Secondary | ICD-10-CM | POA: Diagnosis not present

## 2022-12-23 DIAGNOSIS — I10 Essential (primary) hypertension: Secondary | ICD-10-CM | POA: Insufficient documentation

## 2022-12-23 DIAGNOSIS — D473 Essential (hemorrhagic) thrombocythemia: Secondary | ICD-10-CM | POA: Insufficient documentation

## 2022-12-23 DIAGNOSIS — I110 Hypertensive heart disease with heart failure: Secondary | ICD-10-CM | POA: Diagnosis not present

## 2022-12-23 DIAGNOSIS — G8929 Other chronic pain: Secondary | ICD-10-CM | POA: Diagnosis not present

## 2022-12-23 DIAGNOSIS — F321 Major depressive disorder, single episode, moderate: Secondary | ICD-10-CM | POA: Insufficient documentation

## 2022-12-23 DIAGNOSIS — I5032 Chronic diastolic (congestive) heart failure: Secondary | ICD-10-CM | POA: Diagnosis not present

## 2022-12-23 DIAGNOSIS — M545 Low back pain, unspecified: Secondary | ICD-10-CM | POA: Diagnosis not present

## 2022-12-23 DIAGNOSIS — F419 Anxiety disorder, unspecified: Secondary | ICD-10-CM | POA: Diagnosis not present

## 2022-12-23 DIAGNOSIS — M47896 Other spondylosis, lumbar region: Secondary | ICD-10-CM | POA: Diagnosis not present

## 2022-12-23 DIAGNOSIS — I5042 Chronic combined systolic (congestive) and diastolic (congestive) heart failure: Secondary | ICD-10-CM | POA: Diagnosis not present

## 2022-12-23 MED ORDER — ENTRESTO 49-51 MG PO TABS
1.0000 | ORAL_TABLET | Freq: Two times a day (BID) | ORAL | 3 refills | Status: DC
  Filled 2023-10-27: qty 120, 60d supply, fill #0

## 2022-12-23 NOTE — Progress Notes (Signed)
Cardiology Office Note:    Date:  12/23/2022   ID:  Jordan Higgins, [place] [birth year], MRN [id number]  PCP:  Hurshel Party, NP  Cardiologist:  Gypsy Balsam, MD    Referring MD: Hurshel Party, NP   Chief Complaint  Patient presents with   BP fluctuating    History of Present Illness:    Jordan Higgins is a 77 y.o. female   who was recently admitted with episode of what appears to be TIA.  Quite extensive evaluation has been performed after that she did have angiogram of her artery in her neck as well as echocardiogram and then eventually TEE trying to find a cardiac source of emboli.  She also had Zio patch for 2 weeks which showed on supraventricular tachycardia as well as APCs but no atrial fibrillation.  She is coming today 2 months for follow-up.  She also got thrombocytosis and also she apparently skipped some medication she did take medication the right leg.  She had recently visit with her oncologist/hematologist Dr. Jonetta Speak who told her that that may be what is responsible for his CVA.    She comes to my office for follow-up overall seems to be doing well.  She does have elevated blood pressure which is still not well-controlled interestingly when I walk into the room she says she is hurting all over then she said the biggest problem is profound weakness and fatigue.  She thinks may be related to her hematological problem  Past Medical History:  Diagnosis Date   Anxiety    Chronic diastolic heart failure (HCC) 01/18/2016   Chronic lower back pain    Essential thrombocythemia (HCC)    GERD (gastroesophageal reflux disease)    Headache(784.0) 01/16/2012   "all day; think its related to TIA"   High cholesterol    Hypertension    Hyponatremia 01/18/2016   Hypothyroidism    PONV (postoperative nausea and vomiting)    "violently; only thing that works for her is scopolamine patch "   Stroke (HCC) 12/20/2020   TIA (transient ischemic attack) 01/16/2012    Past  Surgical History:  Procedure Laterality Date   BACK SURGERY     BLADDER SUSPENSION  1990's   "w/rectal suspensiion"   BUBBLE STUDY  01/07/2021   Procedure: BUBBLE STUDY;  Surgeon: Parke Poisson, MD;  Location: [facility] ENDOSCOPY;  Service: Cardiology;;   CHOLECYSTECTOMY  ~ 2010   INCISION AND DRAINAGE OF WOUND  11/15/2011; early 12/2011   "abscess; S/P back OR"; "staph infection in wound"   LUMBAR DISC SURGERY  11/06/2011   "shaved down 2 bulging discs"   PERIPHERALLY INSERTED CENTRAL CATHETER INSERTION  ~ 12/07/2011   right upper arm   TEE WITHOUT CARDIOVERSION N/A 01/07/2021   Procedure: TRANSESOPHAGEAL ECHOCARDIOGRAM (TEE);  Surgeon: Parke Poisson, MD;  Location: [facility] ENDOSCOPY;  Service: Cardiology;  Laterality: N/A;    Current Medications: Current Meds  Medication Sig   aspirin EC 81 MG tablet Take 81 mg by mouth in the morning. Swallow whole.   busPIRone (BUSPAR) 5 MG tablet Take 5 mg by mouth 3 (three) times daily as needed (nerves).   cholecalciferol (VITAMIN D3) 25 MCG (1000 UNIT) tablet Take 5,000 Units by mouth daily.   furosemide (LASIX) 20 MG tablet Take 1 tablet (20 mg total) by mouth daily.   hydroxyurea (HYDREA) 500 MG capsule Take 500 mg by mouth 2 (two) times daily. On Mon, Wed, and Friday. Take 1- 500mg  cap  on Tues, Thurs, Sat and Sunday. May take with food to minimize GI side effects.   levothyroxine (SYNTHROID, LEVOTHROID) 88 MCG tablet Take 88 mcg by mouth daily before breakfast. Take 1 tablet (88 mcg) by mouth in the morning every day EXCEPT on Sunday.   Polyethyl Glycol-Propyl Glycol (SYSTANE) 0.4-0.3 % SOLN Place 1-2 drops into both eyes 3 (three) times daily as needed (dry/irritated eyes.).   sacubitril-valsartan (ENTRESTO) 24-26 MG Take 1 tablet by mouth 2 (two) times daily.   traMADol-acetaminophen (ULTRACET) 37.5-325 MG tablet Take 1 tablet by mouth 2 (two) times daily as needed for moderate pain or severe pain (chronic pain.).   zolpidem (AMBIEN) 10 MG tablet Take  10 mg by mouth at bedtime as needed for sleep.   [DISCONTINUED] potassium chloride 20 MEQ TBCR Take 10 mEq by mouth daily.     Allergies:   Atorvastatin, Mirtazapine, and Trazodone   Social History   Socioeconomic History   Marital status: Married    Spouse name: Not on file   Number of children: Not on file   Years of education: Not on file   Highest education level: Not on file  Occupational History   Not on file  Tobacco Use   Smoking status: Never   Smokeless tobacco: Never  Substance and Sexual Activity   Alcohol use: No    Alcohol/week: 0.0 standard drinks of alcohol   Drug use: No   Sexual activity: Not on file  Other Topics Concern   Not on file  Social History Narrative   Not on file   Social Determinants of Health   Financial Resource Strain: Not on file  Food Insecurity: Not on file  Transportation Needs: Not on file  Physical Activity: Not on file  Stress: Not on file  Social Connections: Not on file     Family History: The patient's family history includes Heart attack in her brother. ROS:   Please see the history of present illness.    All 14 point review of systems negative except as described per history of present illness  EKGs/Labs/Other Studies Reviewed:         Recent Labs: 04/28/2022: ALT 9; NT-Pro BNP 2,445; TSH 3.030 05/15/2022: BUN 15; Creatinine, Ser 1.00; Potassium 5.1; Sodium 136 09/05/2022: Hemoglobin 11.3; Platelets 553  Recent Lipid Panel    Component Value Date/Time   CHOL 138 01/17/2012 0403   TRIG 135 01/17/2012 0403   HDL 44 01/17/2012 0403   CHOLHDL 3.1 01/17/2012 0403   VLDL 27 01/17/2012 0403   LDLCALC 67 01/17/2012 0403    Physical Exam:    VS:  BP (!) 170/90 (BP Location: Left Arm, Patient Position: Sitting)   Pulse 83   Ht 5' (1.524 m)   Wt 113 lb 12.8 oz (51.6 kg)   SpO2 97%   BMI 22.23 kg/m     Wt Readings from Last 3 Encounters:  12/23/22 113 lb 12.8 oz (51.6 kg)  09/05/22 112 lb 6.4 oz (51 kg)   07/02/22 126 lb 3.2 oz (57.2 kg)     GEN:  Well nourished, well developed in no acute distress HEENT: Normal NECK: No JVD; No carotid bruits LYMPHATICS: No lymphadenopathy CARDIAC: RRR, no murmurs, no rubs, no gallops RESPIRATORY:  Clear to auscultation without rales, wheezing or rhonchi  ABDOMEN: Soft, non-tender, non-distended MUSCULOSKELETAL:  No edema; No deformity  SKIN: Warm and dry LOWER EXTREMITIES: no swelling NEUROLOGIC:  Alert and oriented x 3 PSYCHIATRIC:  Normal affect   ASSESSMENT:  1. Chronic diastolic heart failure (HCC)   2. Primary hypertension   3. Essential thrombocytosis (HCC)   4. Dyslipidemia   5. Major depressive disorder, single episode, moderate (HCC)    PLAN:    In order of problems listed above:  Chronic diastolic congestive heart failure appears to be compensated on the physical exam, I do not see any swelling.  I did review echocardiogram from hospital which showed preserved left ventricle ejection fraction. Essential hypertension still not well-controlled we will double the dose of Entresto will check fasting Chem-7 next week. Dyslipidemia, I did review K PN which show me her LDL of 102 HDL 65 we will continue present management for now. Depression history she seems to be quite cheerful today however her depression may be contributing some to her symptom control.   Medication Adjustments/Labs and Tests Ordered: Current medicines are reviewed at length with the patient today.  Concerns regarding medicines are outlined above.  No orders of the defined types were placed in this encounter.  Medication changes: No orders of the defined types were placed in this encounter.   Signed, Georgeanna Lea, MD, [facility] 12/23/2022 4:26 PM    [facility]

## 2022-12-23 NOTE — Patient Instructions (Signed)
Medication Instructions:   INCREASE: Entresto 49/51 1 tablet twice daily   Lab Work: Your physician recommends that you return for lab work in: 1 week You need to have labs done when you are fasting.  You can come Monday through Friday 8:30 am to 12:00 pm and 1:15 to 4:30. You do not need to make an appointment as the order has already been placed. The labs you are going to have done are BMET.   Testing/Procedures: None Ordered   Follow-Up: At [facility], you and your health needs are our priority.  As part of our continuing mission to provide you with exceptional heart care, we have created designated Provider Care Teams.  These Care Teams include your primary Cardiologist (physician) and Advanced Practice Providers (APPs -  Physician Assistants and Nurse Practitioners) who all work together to provide you with the care you need, when you need it.  We recommend signing up for the patient portal called "MyChart".  Sign up information is provided on this After Visit Summary.  MyChart is used to connect with patients for Virtual Visits (Telemedicine).  Patients are able to view lab/test results, encounter notes, upcoming appointments, etc.  Non-urgent messages can be sent to your provider as well.   To learn more about what you can do with MyChart, go to [email or web address].    Your next appointment:   5 month(s)  The format for your next appointment:   In Person  Provider:   Gypsy Balsam, MD    Other Instructions NA

## 2022-12-23 NOTE — Addendum Note (Signed)
Addended by: Baldo Ash D on: 12/23/2022 04:42 PM   Modules accepted: Orders

## 2023-01-02 DIAGNOSIS — I10 Essential (primary) hypertension: Secondary | ICD-10-CM | POA: Diagnosis not present

## 2023-01-07 ENCOUNTER — Telehealth: Payer: Self-pay | Admitting: Cardiology

## 2023-01-07 NOTE — Telephone Encounter (Signed)
Follow Up:     Patient say she would like her last results from last Friday please(01-02-23).

## 2023-01-07 NOTE — Telephone Encounter (Signed)
Patient informed of results.  

## 2023-01-08 NOTE — Progress Notes (Signed)
[facility]  [address] [place],  [place]  [address] [phone]  Clinic Day:  01/09/2023  Referring physician: Hurshel Party, NP  HISTORY OF PRESENT ILLNESS:  The patient is a 77 y.o. female with essential thrombocythemia.  She takes hydroxyurea 500 mg twice daily on Mondays, Wednesdays, and Fridays; she takes hydroxyurea 500 mg daily for the other 4 days a week.  She comes in today to reassess her peripheral counts.  Since her last visit, the patient has been doing okay.  She continues to deny having any clotting complications from her underlying essential thrombocythemia.    PHYSICAL EXAM:  Blood pressure (!) 158/76, pulse 86, temperature 98 F (36.7 C), resp. rate 16, height 5' (1.524 m), weight 113 lb 6.4 oz (51.4 kg), SpO2 98%. Wt Readings from Last 3 Encounters:  01/09/23 113 lb 6.4 oz (51.4 kg)  12/23/22 113 lb 12.8 oz (51.6 kg)  09/05/22 112 lb 6.4 oz (51 kg)   Body mass index is 22.15 kg/m. Performance status (ECOG): 1 Physical Exam Constitutional:      Appearance: Normal appearance. She is not ill-appearing.     Comments: She is ambulating with a cane  HENT:     Mouth/Throat:     Mouth: Mucous membranes are moist.     Pharynx: Oropharynx is clear. No oropharyngeal exudate or posterior oropharyngeal erythema.  Cardiovascular:     Rate and Rhythm: Normal rate and regular rhythm.     Heart sounds: No murmur heard.    No friction rub. No gallop.  Pulmonary:     Effort: Pulmonary effort is normal. No respiratory distress.     Breath sounds: Normal breath sounds. No wheezing, rhonchi or rales.  Abdominal:     General: Bowel sounds are normal. There is no distension.     Palpations: Abdomen is soft. There is no mass.     Tenderness: There is no abdominal tenderness.  Musculoskeletal:        General: No swelling.     Right lower leg: No edema.     Left lower leg: No edema.  Lymphadenopathy:     Cervical: No cervical adenopathy.      Upper Body:     Right upper body: No supraclavicular or axillary adenopathy.     Left upper body: No supraclavicular or axillary adenopathy.     Lower Body: No right inguinal adenopathy. No left inguinal adenopathy.  Skin:    General: Skin is warm.     Coloration: Skin is not jaundiced.     Findings: No lesion or rash.  Neurological:     General: No focal deficit present.     Mental Status: She is alert and oriented to person, place, and time. Mental status is at baseline.  Psychiatric:        Mood and Affect: Mood normal.        Behavior: Behavior normal.        Thought Content: Thought content normal.    LABS:      Latest Ref Rng & Units 01/09/2023   12:00 AM 09/05/2022   12:00 AM 07/02/2022   12:00 AM  CBC  WBC  8.3     7.8     6.1      Hemoglobin 12.0 - 16.0 12.0     11.3     10.0      Hematocrit 36 - 46 37     34     28  Platelets 150 - 400 K/uL 670     553     452         This result is from an external source.   ASSESSMENT & PLAN:  Assessment/Plan:  A 77 y.o. female with essential thrombocythemia.  As her platelets are above 600, her hydroxyurea dose will increase to where she will take it at 500 mg twice a day from Monday to Friday.  It will stay at 500 mg daily for Saturday and Sunday.   Otherwise, I will see this patient back in 3 months to reassess her peripheral counts. The patient understands all the plans discussed today and is in agreement with them.    Sanyia Dini Kirby Funk, MD

## 2023-01-09 ENCOUNTER — Inpatient Hospital Stay: Payer: Medicare Other

## 2023-01-09 ENCOUNTER — Telehealth: Payer: Self-pay | Admitting: Oncology

## 2023-01-09 ENCOUNTER — Other Ambulatory Visit: Payer: Self-pay | Admitting: Oncology

## 2023-01-09 ENCOUNTER — Inpatient Hospital Stay: Payer: Medicare Other | Attending: Oncology | Admitting: Oncology

## 2023-01-09 VITALS — BP 158/76 | HR 86 | Temp 98.0°F | Resp 16 | Ht 60.0 in | Wt 113.4 lb

## 2023-01-09 DIAGNOSIS — D649 Anemia, unspecified: Secondary | ICD-10-CM | POA: Diagnosis not present

## 2023-01-09 DIAGNOSIS — D473 Essential (hemorrhagic) thrombocythemia: Secondary | ICD-10-CM

## 2023-01-09 DIAGNOSIS — D509 Iron deficiency anemia, unspecified: Secondary | ICD-10-CM | POA: Diagnosis not present

## 2023-01-09 LAB — CBC AND DIFFERENTIAL
HCT: 37 (ref 36–46)
Hemoglobin: 12 (ref 12.0–16.0)
Neutrophils Absolute: 6.72
Platelets: 670 10*3/uL — AB (ref 150–400)
WBC: 8.3

## 2023-01-09 LAB — CBC: RBC: 3.35 — AB (ref 3.87–5.11)

## 2023-01-09 NOTE — Telephone Encounter (Signed)
Patient has been scheduled. Aware of appt date and time   Scheduling Message Entered by Rennis Harding A on 01/09/2023 at 12:10 PM Priority: Routine <No visit type provided>  Department: [facility]  Provider:  Scheduling Notes:  Labs/appt 04-10-23

## 2023-01-16 DIAGNOSIS — R531 Weakness: Secondary | ICD-10-CM | POA: Diagnosis not present

## 2023-01-16 DIAGNOSIS — E440 Moderate protein-calorie malnutrition: Secondary | ICD-10-CM | POA: Diagnosis not present

## 2023-01-16 DIAGNOSIS — E538 Deficiency of other specified B group vitamins: Secondary | ICD-10-CM | POA: Diagnosis not present

## 2023-01-16 DIAGNOSIS — F419 Anxiety disorder, unspecified: Secondary | ICD-10-CM | POA: Diagnosis not present

## 2023-02-10 DIAGNOSIS — H26491 Other secondary cataract, right eye: Secondary | ICD-10-CM | POA: Diagnosis not present

## 2023-02-10 DIAGNOSIS — H2512 Age-related nuclear cataract, left eye: Secondary | ICD-10-CM | POA: Diagnosis not present

## 2023-02-16 ENCOUNTER — Telehealth: Payer: Self-pay

## 2023-02-16 NOTE — Telephone Encounter (Signed)
Patient Name: Lakeyta Hirakawa  DOB: 1945/11/18 MRN: [id number]  Primary Cardiologist: Gypsy Balsam, MD  Chart reviewed as part of pre-operative protocol coverage. Cataract extractions are recognized in guidelines as low risk surgeries that do not typically require specific preoperative testing or holding of blood thinner therapy. Therefore, given past medical history and time since last visit, based on ACC/AHA guidelines, Autiana Bault would be at acceptable risk for the planned procedure without further cardiovascular testing.   I will route this recommendation to the requesting party via Epic fax function and remove from pre-op pool.  Please call with questions.  Rip Harbour, NP 02/16/2023, 10:30 AM

## 2023-02-16 NOTE — Telephone Encounter (Signed)
   [facility] Pre-operative Risk Assessment    Request for surgical clearance:  What type of surgery is being performed? Cataract Extraction By PE IOL Left    When is this surgery scheduled? 02/26/2023   What type of clearance is required (medical clearance vs. Pharmacy clearance to hold med vs. Both)? Both  Are there any medications that need to be held prior to surgery and how long?Not specified   Practice name and name of physician performing surgery? Dr. Mack Hook at [facility]    What is your office phone number: [phone] ext [phone]    7.   What is your office fax number: [phone]  8.   Anesthesia type (None, local, MAC, general) ? Iv SEDATION   Jordan Higgins 02/16/2023, 8:32 AM  _________________________________________________________________   (provider comments below)

## 2023-02-19 NOTE — Telephone Encounter (Signed)
Refax clearance status to Dr. Genia Del office

## 2023-02-20 DIAGNOSIS — I5042 Chronic combined systolic (congestive) and diastolic (congestive) heart failure: Secondary | ICD-10-CM | POA: Diagnosis not present

## 2023-02-20 DIAGNOSIS — I10 Essential (primary) hypertension: Secondary | ICD-10-CM | POA: Diagnosis not present

## 2023-02-20 DIAGNOSIS — R531 Weakness: Secondary | ICD-10-CM | POA: Diagnosis not present

## 2023-02-20 DIAGNOSIS — E538 Deficiency of other specified B group vitamins: Secondary | ICD-10-CM | POA: Diagnosis not present

## 2023-02-20 DIAGNOSIS — D473 Essential (hemorrhagic) thrombocythemia: Secondary | ICD-10-CM | POA: Diagnosis not present

## 2023-02-20 DIAGNOSIS — Z6820 Body mass index (BMI) 20.0-20.9, adult: Secondary | ICD-10-CM | POA: Diagnosis not present

## 2023-02-26 DIAGNOSIS — H25812 Combined forms of age-related cataract, left eye: Secondary | ICD-10-CM | POA: Diagnosis not present

## 2023-02-26 DIAGNOSIS — H2512 Age-related nuclear cataract, left eye: Secondary | ICD-10-CM | POA: Diagnosis not present

## 2023-03-27 DIAGNOSIS — R531 Weakness: Secondary | ICD-10-CM | POA: Diagnosis not present

## 2023-03-27 DIAGNOSIS — E538 Deficiency of other specified B group vitamins: Secondary | ICD-10-CM | POA: Diagnosis not present

## 2023-03-27 DIAGNOSIS — E440 Moderate protein-calorie malnutrition: Secondary | ICD-10-CM | POA: Diagnosis not present

## 2023-03-27 DIAGNOSIS — I10 Essential (primary) hypertension: Secondary | ICD-10-CM | POA: Diagnosis not present

## 2023-03-27 DIAGNOSIS — Z6820 Body mass index (BMI) 20.0-20.9, adult: Secondary | ICD-10-CM | POA: Diagnosis not present

## 2023-03-27 DIAGNOSIS — Z2821 Immunization not carried out because of patient refusal: Secondary | ICD-10-CM | POA: Diagnosis not present

## 2023-03-27 DIAGNOSIS — Z9181 History of falling: Secondary | ICD-10-CM | POA: Diagnosis not present

## 2023-03-27 DIAGNOSIS — D473 Essential (hemorrhagic) thrombocythemia: Secondary | ICD-10-CM | POA: Diagnosis not present

## 2023-04-09 NOTE — Progress Notes (Unsigned)
[facility]  [address] [place],  [place]  [address] [phone]  Clinic Day:  04/10/2023  Referring physician: Hurshel Party, NP  HISTORY OF PRESENT ILLNESS:  The patient is a 77 y.o. female with essential thrombocythemia.  She takes hydroxyurea 500 mg twice daily on Mondays through Fridays; she takes hydroxyurea 500 mg daily for Saturdays and "Sundays.  The goal is to keep her platelets below 400-600.  She comes in today to reassess her peripheral counts.  Since her last visit, the patient has been doing okay.  She continues to deny having any clotting complications from her underlying essential thrombocythemia.    PHYSICAL EXAM:  Blood pressure (!) 161/88, pulse 72, temperature 97.9 F (36.6 C), resp. rate 16, height 5' (1.524 m), weight 116 lb 12.8 oz (53 kg), SpO2 100%. Wt Readings from Last 3 Encounters:  04/10/23 116 lb 12.8 oz (53 kg)  01/09/23 113 lb 6.4 oz (51.4 kg)  12/23/22 113 lb 12.8 oz (51.6 kg)   Body mass index is 22.81 kg/m. Performance status (ECOG): 1 Physical Exam Constitutional:      Appearance: Normal appearance. She is not ill-appearing.     Comments: She is ambulating with a walker  HENT:     Mouth/Throat:     Mouth: Mucous membranes are moist.     Pharynx: Oropharynx is clear. No oropharyngeal exudate or posterior oropharyngeal erythema.  Cardiovascular:     Rate and Rhythm: Normal rate and regular rhythm.     Heart sounds: No murmur heard.    No friction rub. No gallop.  Pulmonary:     Effort: Pulmonary effort is normal. No respiratory distress.     Breath sounds: Normal breath sounds. No wheezing, rhonchi or rales.  Abdominal:     General: Bowel sounds are normal. There is no distension.     Palpations: Abdomen is soft. There is no mass.     Tenderness: There is no abdominal tenderness.  Musculoskeletal:        General: No swelling.     Right lower leg: No edema.     Left lower leg: No edema.   Lymphadenopathy:     Cervical: No cervical adenopathy.     Upper Body:     Right upper body: No supraclavicular or axillary adenopathy.     Left upper body: No supraclavicular or axillary adenopathy.     Lower Body: No right inguinal adenopathy. No left inguinal adenopathy.  Skin:    General: Skin is warm.     Coloration: Skin is not jaundiced.     Findings: No lesion or rash.  Neurological:     General: No focal deficit present.     Mental Status: She is alert and oriented to person, place, and time. Mental status is at baseline.  Psychiatric:        Mood and Affect: Mood normal.        Behavior: Behavior normal.        Thought Content: Thought content normal.    LABS:      Latest Ref Rng & Units 04/10/2023   10:33 AM 01/09/2023   12:00 AM 09/05/2022   12:00 AM  CBC  WBC 4.0 - 10.5 K/uL 8.8  8.3     7.8      Hemoglobin 12.0 - 15.0 g/dL 10.6  12.0     11" .3      Hematocrit 36.0 - 46.0 % 32.0  37  34      Platelets 150 - 400 K/uL 668  670     553         This result is from an external source.   IRON LEVELS PENDING  ASSESSMENT & PLAN:  Assessment/Plan:  A 77 y.o. female with essential thrombocythemia.  Her platelets remain above 600; however, her iron levels    I will see this patient back in 3 months to reassess her peripheral counts. The patient understands all the plans discussed today and is in agreement with them.    Athalene Kolle Kirby Funk, MD

## 2023-04-10 ENCOUNTER — Telehealth: Payer: Self-pay | Admitting: Oncology

## 2023-04-10 ENCOUNTER — Telehealth: Payer: Self-pay

## 2023-04-10 ENCOUNTER — Inpatient Hospital Stay: Payer: Medicare Other | Attending: Oncology | Admitting: Oncology

## 2023-04-10 ENCOUNTER — Other Ambulatory Visit: Payer: Self-pay | Admitting: Oncology

## 2023-04-10 ENCOUNTER — Inpatient Hospital Stay: Payer: Medicare Other

## 2023-04-10 VITALS — BP 169/77 | HR 72 | Temp 97.9°F | Resp 16 | Ht 60.0 in | Wt 116.8 lb

## 2023-04-10 DIAGNOSIS — D75839 Thrombocytosis, unspecified: Secondary | ICD-10-CM | POA: Insufficient documentation

## 2023-04-10 DIAGNOSIS — D473 Essential (hemorrhagic) thrombocythemia: Secondary | ICD-10-CM

## 2023-04-10 DIAGNOSIS — D508 Other iron deficiency anemias: Secondary | ICD-10-CM

## 2023-04-10 LAB — CBC WITH DIFFERENTIAL (CANCER CENTER ONLY)
Abs Immature Granulocytes: 0.06 10*3/uL (ref 0.00–0.07)
Basophils Absolute: 0.1 10*3/uL (ref 0.0–0.1)
Basophils Relative: 1 %
Eosinophils Absolute: 0 10*3/uL (ref 0.0–0.5)
Eosinophils Relative: 1 %
HCT: 32 % — ABNORMAL LOW (ref 36.0–46.0)
Hemoglobin: 10.6 g/dL — ABNORMAL LOW (ref 12.0–15.0)
Immature Granulocytes: 1 %
Lymphocytes Relative: 11 %
Lymphs Abs: 0.9 10*3/uL (ref 0.7–4.0)
MCH: 40.8 pg — ABNORMAL HIGH (ref 26.0–34.0)
MCHC: 33.1 g/dL (ref 30.0–36.0)
MCV: 123.1 fL — ABNORMAL HIGH (ref 80.0–100.0)
Monocytes Absolute: 0.5 10*3/uL (ref 0.1–1.0)
Monocytes Relative: 6 %
Neutro Abs: 7.2 10*3/uL (ref 1.7–7.7)
Neutrophils Relative %: 80 %
Platelet Count: 668 10*3/uL — ABNORMAL HIGH (ref 150–400)
RBC: 2.6 MIL/uL — ABNORMAL LOW (ref 3.87–5.11)
RDW: 15 % (ref 11.5–15.5)
WBC Count: 8.8 10*3/uL (ref 4.0–10.5)
nRBC: 0 % (ref 0.0–0.2)
nRBC: 0 /100{WBCs}

## 2023-04-10 LAB — IRON AND TIBC
Iron: 62 ug/dL (ref 28–170)
Saturation Ratios: 18 % (ref 10.4–31.8)
TIBC: 346 ug/dL (ref 250–450)
UIBC: 284 ug/dL

## 2023-04-10 LAB — FERRITIN: Ferritin: 11 ng/mL (ref 11–307)

## 2023-04-10 NOTE — Telephone Encounter (Signed)
Pt called to check on iron results, not ready as of yet @ 1630.

## 2023-04-10 NOTE — Telephone Encounter (Signed)
06/19/22 Next appt scheduled and confirmed with patient

## 2023-04-13 ENCOUNTER — Encounter: Payer: Self-pay | Admitting: Oncology

## 2023-04-13 NOTE — Telephone Encounter (Signed)
Latest Reference Range & Units 04/10/23 10:33  Iron 28 - 170 ug/dL 62  UIBC ug/dL 578  TIBC 469 - 629 ug/dL 528  Saturation Ratios 10.4 - 31.8 % 18  Ferritin 11 - 307 ng/mL 11  Dr Melvyn Neth reviewed labs above and recommends IV iron as her ferritin as dropped from 65 to 11. He does want her to continue taking her hydrea, and he will see her in 3 months.

## 2023-04-15 ENCOUNTER — Telehealth: Payer: Self-pay | Admitting: Oncology

## 2023-04-15 NOTE — Telephone Encounter (Signed)
Patient has been scheduled. Aware of appt date and time.    Scheduling Message Entered by Stryker, AMY W on 04/13/2023 at 10:19 AM Priority: High INFUSION 1HR30MIN (90)  Department: [facility]  Provider: Weston Settle, MD  Appointment Notes:  Needs IV iron - pt prefers am appts

## 2023-04-17 ENCOUNTER — Inpatient Hospital Stay: Payer: Medicare Other

## 2023-04-17 VITALS — BP 145/67 | HR 73 | Temp 98.1°F | Resp 18 | Ht 60.0 in | Wt 117.0 lb

## 2023-04-17 DIAGNOSIS — D509 Iron deficiency anemia, unspecified: Secondary | ICD-10-CM

## 2023-04-17 DIAGNOSIS — D473 Essential (hemorrhagic) thrombocythemia: Secondary | ICD-10-CM | POA: Diagnosis not present

## 2023-04-17 DIAGNOSIS — D75839 Thrombocytosis, unspecified: Secondary | ICD-10-CM | POA: Diagnosis not present

## 2023-04-17 MED ORDER — SODIUM CHLORIDE 0.9% FLUSH: 10.0000 mL | Freq: Two times a day (BID) | INTRAVENOUS | Status: DC

## 2023-04-17 MED ORDER — SODIUM CHLORIDE 0.9 % IV SOLN: INTRAVENOUS | Status: DC | PRN

## 2023-04-17 MED ORDER — SODIUM CHLORIDE 0.9 % IV SOLN
510.0000 mg | Freq: Once | INTRAVENOUS | Status: AC
  Administered 2023-04-17: 510 mg via INTRAVENOUS
  Filled 2023-04-17: qty 510

## 2023-04-17 NOTE — Patient Instructions (Signed)
 Ferumoxytol Injection What is this medication? FERUMOXYTOL (FER ue MOX i tol) treats low levels of iron in your body (iron deficiency anemia). Iron is a mineral that plays an important role in making red blood cells, which carry oxygen from your lungs to the rest of your body. This medicine may be used for other purposes; ask your health care provider or pharmacist if you have questions. COMMON BRAND NAME(S): Feraheme What should I tell my care team before I take this medication? They need to know if you have any of these conditions: Anemia not caused by low iron levels High levels of iron in the blood Magnetic resonance imaging (MRI) test scheduled An unusual or allergic reaction to iron, other medications, foods, dyes, or preservatives Pregnant or trying to get pregnant Breastfeeding How should I use this medication? This medication is injected into a vein. It is given by your care team in a hospital or clinic setting. Talk to your care team the use of this medication in children. Special care may be needed. Overdosage: If you think you have taken too much of this medicine contact a poison control center or emergency room at once. NOTE: This medicine is only for you. Do not share this medicine with others. What if I miss a dose? It is important not to miss your dose. Call your care team if you are unable to keep an appointment. What may interact with this medication? Other iron products This list may not describe all possible interactions. Give your health care provider a list of all the medicines, herbs, non-prescription drugs, or dietary supplements you use. Also tell them if you smoke, drink alcohol, or use illegal drugs. Some items may interact with your medicine. What should I watch for while using this medication? Visit your care team regularly. Tell your care team if your symptoms do not start to get better or if they get worse. You may need blood work done while you are taking this  medication. You may need to follow a special diet. Talk to your care team. Foods that contain iron include: whole grains/cereals, dried fruits, beans, or peas, leafy green vegetables, and organ meats (liver, kidney). What side effects may I notice from receiving this medication? Side effects that you should report to your care team as soon as possible: Allergic reactions--skin rash, itching, hives, swelling of the face, lips, tongue, or throat Low blood pressure--dizziness, feeling faint or lightheaded, blurry vision Shortness of breath Side effects that usually do not require medical attention (report to your care team if they continue or are bothersome): Flushing Headache Joint pain Muscle pain Nausea Pain, redness, or irritation at injection site This list may not describe all possible side effects. Call your doctor for medical advice about side effects. You may report side effects to FDA at 1-800-FDA-1088. Where should I keep my medication? This medication is given in a hospital or clinic. It will not be stored at home. NOTE: This sheet is a summary. It may not cover all possible information. If you have questions about this medicine, talk to your doctor, pharmacist, or health care provider.  2024 Elsevier/Gold Standard (2022-10-24 00:00:00)

## 2023-04-23 ENCOUNTER — Inpatient Hospital Stay: Payer: Medicare Other

## 2023-04-23 VITALS — BP 150/70 | HR 88 | Temp 98.0°F | Resp 20

## 2023-04-23 DIAGNOSIS — D75839 Thrombocytosis, unspecified: Secondary | ICD-10-CM | POA: Diagnosis not present

## 2023-04-23 DIAGNOSIS — D473 Essential (hemorrhagic) thrombocythemia: Secondary | ICD-10-CM | POA: Diagnosis not present

## 2023-04-23 DIAGNOSIS — D509 Iron deficiency anemia, unspecified: Secondary | ICD-10-CM

## 2023-04-23 MED ORDER — SODIUM CHLORIDE 0.9% FLUSH
10.0000 mL | Freq: Two times a day (BID) | INTRAVENOUS | Status: DC
Start: 2023-04-23 — End: 2023-04-23

## 2023-04-23 MED ORDER — FERUMOXYTOL INJECTION 510 MG/17 ML
510.0000 mg | Freq: Once | INTRAVENOUS | Status: AC
  Administered 2023-04-23: 510 mg via INTRAVENOUS
  Filled 2023-04-23: qty 510

## 2023-04-23 NOTE — Patient Instructions (Signed)
 Ferumoxytol Injection What is this medication? FERUMOXYTOL (FER ue MOX i tol) treats low levels of iron in your body (iron deficiency anemia). Iron is a mineral that plays an important role in making red blood cells, which carry oxygen from your lungs to the rest of your body. This medicine may be used for other purposes; ask your health care provider or pharmacist if you have questions. COMMON BRAND NAME(S): Feraheme What should I tell my care team before I take this medication? They need to know if you have any of these conditions: Anemia not caused by low iron levels High levels of iron in the blood Magnetic resonance imaging (MRI) test scheduled An unusual or allergic reaction to iron, other medications, foods, dyes, or preservatives Pregnant or trying to get pregnant Breastfeeding How should I use this medication? This medication is injected into a vein. It is given by your care team in a hospital or clinic setting. Talk to your care team the use of this medication in children. Special care may be needed. Overdosage: If you think you have taken too much of this medicine contact a poison control center or emergency room at once. NOTE: This medicine is only for you. Do not share this medicine with others. What if I miss a dose? It is important not to miss your dose. Call your care team if you are unable to keep an appointment. What may interact with this medication? Other iron products This list may not describe all possible interactions. Give your health care provider a list of all the medicines, herbs, non-prescription drugs, or dietary supplements you use. Also tell them if you smoke, drink alcohol, or use illegal drugs. Some items may interact with your medicine. What should I watch for while using this medication? Visit your care team regularly. Tell your care team if your symptoms do not start to get better or if they get worse. You may need blood work done while you are taking this  medication. You may need to follow a special diet. Talk to your care team. Foods that contain iron include: whole grains/cereals, dried fruits, beans, or peas, leafy green vegetables, and organ meats (liver, kidney). What side effects may I notice from receiving this medication? Side effects that you should report to your care team as soon as possible: Allergic reactions--skin rash, itching, hives, swelling of the face, lips, tongue, or throat Low blood pressure--dizziness, feeling faint or lightheaded, blurry vision Shortness of breath Side effects that usually do not require medical attention (report to your care team if they continue or are bothersome): Flushing Headache Joint pain Muscle pain Nausea Pain, redness, or irritation at injection site This list may not describe all possible side effects. Call your doctor for medical advice about side effects. You may report side effects to FDA at 1-800-FDA-1088. Where should I keep my medication? This medication is given in a hospital or clinic. It will not be stored at home. NOTE: This sheet is a summary. It may not cover all possible information. If you have questions about this medicine, talk to your doctor, pharmacist, or health care provider.  2024 Elsevier/Gold Standard (2022-10-24 00:00:00)

## 2023-05-25 ENCOUNTER — Encounter: Payer: Self-pay | Admitting: Cardiology

## 2023-05-25 ENCOUNTER — Ambulatory Visit: Payer: Medicare Other | Attending: Cardiology | Admitting: Cardiology

## 2023-05-25 VITALS — BP 140/72 | HR 73 | Ht 60.0 in | Wt 116.8 lb

## 2023-05-25 DIAGNOSIS — E785 Hyperlipidemia, unspecified: Secondary | ICD-10-CM | POA: Diagnosis not present

## 2023-05-25 DIAGNOSIS — G459 Transient cerebral ischemic attack, unspecified: Secondary | ICD-10-CM

## 2023-05-25 DIAGNOSIS — I5032 Chronic diastolic (congestive) heart failure: Secondary | ICD-10-CM

## 2023-05-25 DIAGNOSIS — I10 Essential (primary) hypertension: Secondary | ICD-10-CM | POA: Diagnosis not present

## 2023-05-25 MED ORDER — DILTIAZEM HCL ER COATED BEADS 180 MG PO CP24: 180.0000 mg | ORAL_CAPSULE | Freq: Every day | ORAL | 3 refills | Status: DC

## 2023-05-25 NOTE — Patient Instructions (Signed)
Medication Instructions:   INCREASE: Cardizem CD to 180mg  daily   Lab Work: None Ordered If you have labs (blood work) drawn today and your tests are completely normal, you will receive your results only by: MyChart Message (if you have MyChart) OR A paper copy in the mail If you have any lab test that is abnormal or we need to change your treatment, we will call you to review the results.   Testing/Procedures: None Ordered   Follow-Up: At [facility], you and your health needs are our priority.  As part of our continuing mission to provide you with exceptional heart care, we have created designated Provider Care Teams.  These Care Teams include your primary Cardiologist (physician) and Advanced Practice Providers (APPs -  Physician Assistants and Nurse Practitioners) who all work together to provide you with the care you need, when you need it.  We recommend signing up for the patient portal called "MyChart".  Sign up information is provided on this After Visit Summary.  MyChart is used to connect with patients for Virtual Visits (Telemedicine).  Patients are able to view lab/test results, encounter notes, upcoming appointments, etc.  Non-urgent messages can be sent to your provider as well.   To learn more about what you can do with MyChart, go to [email or web address].    Your next appointment:   4 month(s)  The format for your next appointment:   In Person  Provider:   Gypsy Balsam, MD    Other Instructions NA

## 2023-05-25 NOTE — Addendum Note (Signed)
Addended by: Baldo Ash D on: 05/25/2023 10:54 AM   Modules accepted: Orders

## 2023-05-25 NOTE — Progress Notes (Signed)
Cardiology Office Note:    Date:  05/25/2023   ID:  Jordan Higgins, DOB 02-12-1946, MRN [id number]  PCP:  Hurshel Party, NP  Cardiologist:  Gypsy Balsam, MD    Referring MD: Hurshel Party, NP   Chief Complaint  Patient presents with   Fatigue    History of Present Illness:    Jordan Higgins is a 77 y.o. female past medical history significant for TIA, essential hypertension, chronic diastolic congestive heart failure, essential thrombocythemia, comes today for regular follow-up.  Overall she seems to be doing well.  She denies have any chest pain tightness squeezing pressure burning chest no palpitations dizziness swelling of lower extremities chief complaint is fatigue tiredness and weakness.  Still she tries to go to the mall and walk on the regular basis with some difficulty she is walker for walking.  Past Medical History:  Diagnosis Date   Anxiety    Chronic diastolic heart failure (HCC) 01/18/2016   Chronic lower back pain    Essential thrombocythemia (HCC)    GERD (gastroesophageal reflux disease)    Headache(784.0) 01/16/2012   "all day; think its related to TIA"   High cholesterol    Hypertension    Hyponatremia 01/18/2016   Hypothyroidism    PONV (postoperative nausea and vomiting)    "violently; only thing that works for her is scopolamine patch "   Stroke (HCC) 12/20/2020   TIA (transient ischemic attack) 01/16/2012    Past Surgical History:  Procedure Laterality Date   BACK SURGERY     BLADDER SUSPENSION  1990's   "w/rectal suspensiion"   BUBBLE STUDY  01/07/2021   Procedure: BUBBLE STUDY;  Surgeon: Parke Poisson, MD;  Location: [facility] ENDOSCOPY;  Service: Cardiology;;   CHOLECYSTECTOMY  ~ 2010   INCISION AND DRAINAGE OF WOUND  11/15/2011; early 12/2011   "abscess; S/P back OR"; "staph infection in wound"   LUMBAR DISC SURGERY  11/06/2011   "shaved down 2 bulging discs"   PERIPHERALLY INSERTED CENTRAL CATHETER INSERTION  ~ 12/07/2011    right upper arm   TEE WITHOUT CARDIOVERSION N/A 01/07/2021   Procedure: TRANSESOPHAGEAL ECHOCARDIOGRAM (TEE);  Surgeon: Parke Poisson, MD;  Location: [facility] ENDOSCOPY;  Service: Cardiology;  Laterality: N/A;    Current Medications: Current Meds  Medication Sig   aspirin EC 81 MG tablet Take 81 mg by mouth in the morning. Swallow whole.   busPIRone (BUSPAR) 5 MG tablet Take 5 mg by mouth 3 (three) times daily as needed (nerves).   cholecalciferol (VITAMIN D3) 25 MCG (1000 UNIT) tablet Take 5,000 Units by mouth daily.   diltiazem (CARDIZEM CD) 120 MG 24 hr capsule Take 120 mg by mouth daily.   furosemide (LASIX) 20 MG tablet Take 1 tablet (20 mg total) by mouth daily.   hydroxyurea (HYDREA) 500 MG capsule Take 500 mg by mouth 2 (two) times daily. On Mon, Wed, and Friday. Take 1- 500mg  cap on Tues, Thurs, Sat and Sunday. May take with food to minimize GI side effects.   levothyroxine (SYNTHROID, LEVOTHROID) 88 MCG tablet Take 88 mcg by mouth daily before breakfast. Take 1 tablet (88 mcg) by mouth in the morning every day EXCEPT on Sunday.   Polyethyl Glycol-Propyl Glycol (SYSTANE) 0.4-0.3 % SOLN Place 1-2 drops into both eyes 3 (three) times daily as needed (dry/irritated eyes.).   sacubitril-valsartan (ENTRESTO) 49-51 MG Take 1 tablet by mouth 2 (two) times daily.   traMADol-acetaminophen (ULTRACET) 37.5-325 MG tablet Take 1  tablet by mouth 2 (two) times daily as needed for moderate pain or severe pain (chronic pain.).   zolpidem (AMBIEN) 10 MG tablet Take 10 mg by mouth at bedtime as needed for sleep.     Allergies:   Atorvastatin, Mirtazapine, and Trazodone   Social History   Socioeconomic History   Marital status: Married    Spouse name: Not on file   Number of children: Not on file   Years of education: Not on file   Highest education level: Not on file  Occupational History   Not on file  Tobacco Use   Smoking status: Never   Smokeless tobacco: Never  Substance and Sexual  Activity   Alcohol use: No    Alcohol/week: 0.0 standard drinks of alcohol   Drug use: No   Sexual activity: Not on file  Other Topics Concern   Not on file  Social History Narrative   Not on file   Social Drivers of Health   Financial Resource Strain: Not on file  Food Insecurity: Not on file  Transportation Needs: Not on file  Physical Activity: Not on file  Stress: Not on file  Social Connections: Not on file     Family History: The patient's family history includes Heart attack in her brother. ROS:   Please see the history of present illness.    All 14 point review of systems negative except as described per history of present illness  EKGs/Labs/Other Studies Reviewed:    EKG Interpretation Date/Time:  Monday May 25 2023 10:21:39 EST Ventricular Rate:  73 PR Interval:  128 QRS Duration:  84 QT Interval:  386 QTC Calculation: 425 R Axis:   65  Text Interpretation: Normal sinus rhythm Septal infarct , age undetermined Abnormal ECG No previous ECGs available Confirmed by Gypsy Balsam [phone]) on 05/25/2023 10:28:44 AM    Recent Labs: 01/02/2023: BUN 14; Creatinine, Ser 0.97; Potassium 4.3; Sodium 139 04/10/2023: Hemoglobin 10.6; Platelet Count 668  Recent Lipid Panel    Component Value Date/Time   CHOL 138 01/17/2012 0403   TRIG 135 01/17/2012 0403   HDL 44 01/17/2012 0403   CHOLHDL 3.1 01/17/2012 0403   VLDL 27 01/17/2012 0403   LDLCALC 67 01/17/2012 0403    Physical Exam:    VS:  BP (!) 140/72 (BP Location: Right Arm, Patient Position: Sitting)   Pulse 73   Ht 5' (1.524 m)   Wt 116 lb 12.8 oz (53 kg)   SpO2 99%   BMI 22.81 kg/m     Wt Readings from Last 3 Encounters:  05/25/23 116 lb 12.8 oz (53 kg)  04/17/23 117 lb 0.6 oz (53.1 kg)  04/10/23 116 lb 12.8 oz (53 kg)     GEN:  Well nourished, well developed in no acute distress HEENT: Normal NECK: No JVD; No carotid bruits LYMPHATICS: No lymphadenopathy CARDIAC: RRR, no murmurs, no  rubs, no gallops RESPIRATORY:  Clear to auscultation without rales, wheezing or rhonchi  ABDOMEN: Soft, non-tender, non-distended MUSCULOSKELETAL:  No edema; No deformity  SKIN: Warm and dry LOWER EXTREMITIES: no swelling NEUROLOGIC:  Alert and oriented x 3 PSYCHIATRIC:  Normal affect   ASSESSMENT:    1. Primary hypertension   2. Chronic diastolic heart failure (HCC)   3. TIA (transient ischemic attack)   4. Dyslipidemia    PLAN:    In order of problems listed above:  Essential hypertension better but still elevated EKG showed normal PR interval will increase dose of Cardizem from 120  mg daily to 180 mg daily. Chronic diastolic congestive heart rate compensated on physical exam continue present management. History of TIA felt to be related to hematological issue that being follow-up like always excellently by our hematology oncology team. Dyslipidemia I did review K PN which show LDL of 102 HDL 65 will continue present management   Medication Adjustments/Labs and Tests Ordered: Current medicines are reviewed at length with the patient today.  Concerns regarding medicines are outlined above.  Orders Placed This Encounter  Procedures   EKG 12-Lead   Medication changes: No orders of the defined types were placed in this encounter.   Signed, Georgeanna Lea, MD, [facility] 05/25/2023 10:44 AM    [facility]

## 2023-06-05 DIAGNOSIS — Z23 Encounter for immunization: Secondary | ICD-10-CM | POA: Diagnosis not present

## 2023-06-05 DIAGNOSIS — E538 Deficiency of other specified B group vitamins: Secondary | ICD-10-CM | POA: Diagnosis not present

## 2023-06-05 DIAGNOSIS — I10 Essential (primary) hypertension: Secondary | ICD-10-CM | POA: Diagnosis not present

## 2023-06-05 DIAGNOSIS — D509 Iron deficiency anemia, unspecified: Secondary | ICD-10-CM | POA: Diagnosis not present

## 2023-06-05 DIAGNOSIS — I5042 Chronic combined systolic (congestive) and diastolic (congestive) heart failure: Secondary | ICD-10-CM | POA: Diagnosis not present

## 2023-06-05 DIAGNOSIS — Z6821 Body mass index (BMI) 21.0-21.9, adult: Secondary | ICD-10-CM | POA: Diagnosis not present

## 2023-06-05 DIAGNOSIS — M2550 Pain in unspecified joint: Secondary | ICD-10-CM | POA: Diagnosis not present

## 2023-06-05 DIAGNOSIS — D473 Essential (hemorrhagic) thrombocythemia: Secondary | ICD-10-CM | POA: Diagnosis not present

## 2023-06-05 DIAGNOSIS — E039 Hypothyroidism, unspecified: Secondary | ICD-10-CM | POA: Diagnosis not present

## 2023-06-05 DIAGNOSIS — R531 Weakness: Secondary | ICD-10-CM | POA: Diagnosis not present

## 2023-06-05 DIAGNOSIS — E440 Moderate protein-calorie malnutrition: Secondary | ICD-10-CM | POA: Diagnosis not present

## 2023-06-22 DIAGNOSIS — M25571 Pain in right ankle and joints of right foot: Secondary | ICD-10-CM | POA: Insufficient documentation

## 2023-06-22 DIAGNOSIS — M19072 Primary osteoarthritis, left ankle and foot: Secondary | ICD-10-CM | POA: Diagnosis not present

## 2023-06-22 DIAGNOSIS — M19071 Primary osteoarthritis, right ankle and foot: Secondary | ICD-10-CM | POA: Diagnosis not present

## 2023-07-14 DIAGNOSIS — I10 Essential (primary) hypertension: Secondary | ICD-10-CM | POA: Diagnosis not present

## 2023-07-14 DIAGNOSIS — J209 Acute bronchitis, unspecified: Secondary | ICD-10-CM | POA: Diagnosis not present

## 2023-07-14 DIAGNOSIS — Z6821 Body mass index (BMI) 21.0-21.9, adult: Secondary | ICD-10-CM | POA: Diagnosis not present

## 2023-07-16 NOTE — Progress Notes (Deleted)
 [facility]  [address] [place],  [place]  [address] [phone]  Clinic Day:  04/10/2023  Referring physician: Hurshel Party, NP  HISTORY OF PRESENT ILLNESS:  The patient is a 78 y.o. female with essential thrombocythemia.  She takes hydroxyurea 500 mg twice daily on Mondays through Fridays; she takes hydroxyurea 500 mg daily for Saturdays and Sundays.  She comes in today to reassess her peripheral counts after receiving IV iron.  Since her last visit, the patient has been doing okay.  She continues to deny having any clotting complications from her underlying essential thrombocythemia.    PHYSICAL EXAM:  There were no vitals taken for this visit. Wt Readings from Last 3 Encounters:  05/25/23 116 lb 12.8 oz (53 kg)  04/17/23 117 lb 0.6 oz (53.1 kg)  04/10/23 116 lb 12.8 oz (53 kg)   There is no height or weight on file to calculate BMI. Performance status (ECOG): 1 Physical Exam Constitutional:      Appearance: Normal appearance. She is not ill-appearing.     Comments: She is ambulating with a walker  HENT:     Mouth/Throat:     Mouth: Mucous membranes are moist.     Pharynx: Oropharynx is clear. No oropharyngeal exudate or posterior oropharyngeal erythema.  Cardiovascular:     Rate and Rhythm: Normal rate and regular rhythm.     Heart sounds: No murmur heard.    No friction rub. No gallop.  Pulmonary:     Effort: Pulmonary effort is normal. No respiratory distress.     Breath sounds: Normal breath sounds. No wheezing, rhonchi or rales.  Abdominal:     General: Bowel sounds are normal. There is no distension.     Palpations: Abdomen is soft. There is no mass.     Tenderness: There is no abdominal tenderness.  Musculoskeletal:        General: No swelling.     Right lower leg: No edema.     Left lower leg: No edema.  Lymphadenopathy:     Cervical: No cervical adenopathy.     Upper Body:     Right upper body: No supraclavicular or  axillary adenopathy.     Left upper body: No supraclavicular or axillary adenopathy.     Lower Body: No right inguinal adenopathy. No left inguinal adenopathy.  Skin:    General: Skin is warm.     Coloration: Skin is not jaundiced.     Findings: No lesion or rash.  Neurological:     General: No focal deficit present.     Mental Status: She is alert and oriented to person, place, and time. Mental status is at baseline.  Psychiatric:        Mood and Affect: Mood normal.        Behavior: Behavior normal.        Thought Content: Thought content normal.    LABS:      Latest Ref Rng & Units 04/10/2023   10:33 AM 01/09/2023   12:00 AM 09/05/2022   12:00 AM  CBC  WBC 4.0 - 10.5 K/uL 8.8  8.3     7.8      Hemoglobin 12.0 - 15.0 g/dL 10.6  12.0     11.3      Hematocrit 36.0 - 46.0 % 32.0  37     34      Platelets 150 - 400 K/uL 668  670     55 3  This result is from an external source.    Latest Reference Range & Units 07/02/22 13:38 04/10/23 10:33  Iron 28 - 170 ug/dL 61 62  UIBC ug/dL 161 096  TIBC 045 - 409 ug/dL 811 914  Saturation Ratios 10.4 - 31.8 % 24 18  Ferritin 11 - 307 ng/mL 65 11    ASSESSMENT & PLAN:  Assessment/Plan:  A 78 y.o. female with essential thrombocythemia.  Her platelets remain above 600; however, her hemoglobin has fallen.  Furthermore, her ferritin has fallen from 65 to 11.  This suggests her iron stores are becoming depleted, which could be factoring into both her high platelets and lower hemoglobin.  Based upon this, I will arrange for her to receive IV iron over these next few weeks.  With respect to her ET, she will continue to take hydroxyurea 500 mg twice daily on Mondays through Fridays and hydroxyurea 500 mg daily for Saturdays and Sundays. I will see this patient back in 3 months to reassess her peripheral counts to see how well she responded to her IV iron. The patient understands all the plans discussed today and is in agreement with them.     Zorian Gunderman Kirby Funk, MD

## 2023-07-17 ENCOUNTER — Inpatient Hospital Stay: Payer: Medicare Other | Admitting: Oncology

## 2023-07-17 ENCOUNTER — Inpatient Hospital Stay: Payer: Medicare Other

## 2023-08-06 NOTE — Progress Notes (Signed)
 [facility]  [address] [place],  [place]  [address] [phone]  Clinic Day:   08/07/2023  Referring physician: Hurshel Party, NP  HISTORY OF PRESENT ILLNESS:  The patient is a 78 y.o. female with essential thrombocythemia.  She takes hydroxyurea 500 mg twice daily on Mondays through Fridays; she takes hydroxyurea 500 mg daily for Saturdays and Sundays.  She also has iron deficiency anemia.  She comes in today to reassess her peripheral counts after receiving IV iron in November 2024.  Since her last visit, the patient has been doing okay.  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  She continues to deny having any clotting complications from her underlying essential thrombocythemia.    PHYSICAL EXAM:  Blood pressure (!) 166/72, pulse 71, temperature 98.1 F (36.7 C), temperature source Oral, resp. rate 16, height 5' (1.524 m), weight 120 lb 4.8 oz (54.6 kg), SpO2 100%. Wt Readings from Last 3 Encounters:  08/07/23 120 lb 4.8 oz (54.6 kg)  05/25/23 116 lb 12.8 oz (53 kg)  04/17/23 117 lb 0.6 oz (53.1 kg)   Body mass index is 23.49 kg/m. Performance status (ECOG): 1 Physical Exam Constitutional:      Appearance: Normal appearance. She is not ill-appearing.     Comments: She is ambulating with a walker  HENT:     Mouth/Throat:     Mouth: Mucous membranes are moist.     Pharynx: Oropharynx is clear. No oropharyngeal exudate or posterior oropharyngeal erythema.  Cardiovascular:     Rate and Rhythm: Normal rate and regular rhythm.     Heart sounds: No murmur heard.    No friction rub. No gallop.  Pulmonary:     Effort: Pulmonary effort is normal. No respiratory distress.     Breath sounds: Normal breath sounds. No wheezing, rhonchi or rales.  Abdominal:     General: Bowel sounds are normal. There is no distension.     Palpations: Abdomen is soft. There is no mass.     Tenderness: There is no abdominal tenderness.  Musculoskeletal:         General: No swelling.     Right lower leg: No edema.     Left lower leg: No edema.  Lymphadenopathy:     Cervical: No cervical adenopathy.     Upper Body:     Right upper body: No supraclavicular or axillary adenopathy.     Left upper body: No supraclavicular or axillary adenopathy.     Lower Body: No right inguinal adenopathy. No left inguinal adenopathy.  Skin:    General: Skin is warm.     Coloration: Skin is not jaundiced.     Findings: No lesion or rash.  Neurological:     General: No focal deficit present.     Mental Status: She is alert and oriented to person, place, and time. Mental status is at baseline.  Psychiatric:        Mood and Affect: Mood normal.        Behavior: Behavior normal.        Thought Content: Thought content normal.   LABS:      Latest Ref Rng & Units 08/07/2023   10:18 AM 04/10/2023   10:33 AM 01/09/2023   12:00 AM  CBC  WBC 4.0 - 10.5 K/uL 11.4  8.8  8.3      Hemoglobin 12.0 - 15.0 g/dL 62.9  52.8  41.3      Hematocrit 36.0 -  46.0 % 33.9  32.0  37      Platelets 150 - 400 K/uL 920  668  670         This result is from an external source.    Latest Reference Range & Units 04/10/23 10:33 08/07/23 10:19  Iron 28 - 170 ug/dL 62 98  UIBC ug/dL 161 096  TIBC 045 - 409 ug/dL 811 914  Saturation Ratios 10.4 - 31.8 % 18 37 (H)  Ferritin 11 - 307 ng/mL 11 87  (H): Data is abnormally high  ASSESSMENT & PLAN:  Assessment/Plan:  A 78 y.o. female with essential thrombocythemia and iron deficiency anemia.  Despite her iron levels improving after her IV iron, her platelets have briskly risen.  Usually, restoration of iron stores helps bring down one's platelets.  The fact that they've risen highlights her essential thrombocythemia.  Based upon this, her hydroxyurea will be increased to 500 mg twice daily for every day.  Ultimately, the goal is to get her platelets below 400-600.  I will see this patient back in 2 months to reassess her peripheral counts to see  how well she responded to the increase in her hydroxyurea.  The patient understands all the plans discussed today and is in agreement with them.    Fabian Walder Kirby Funk, MD

## 2023-08-07 ENCOUNTER — Telehealth: Payer: Self-pay

## 2023-08-07 ENCOUNTER — Inpatient Hospital Stay: Payer: Medicare Other

## 2023-08-07 ENCOUNTER — Other Ambulatory Visit: Payer: Self-pay | Admitting: Oncology

## 2023-08-07 ENCOUNTER — Inpatient Hospital Stay: Payer: Medicare Other | Attending: Oncology | Admitting: Oncology

## 2023-08-07 VITALS — BP 167/77 | HR 71 | Temp 98.1°F | Resp 16 | Ht 60.0 in | Wt 120.3 lb

## 2023-08-07 DIAGNOSIS — D473 Essential (hemorrhagic) thrombocythemia: Secondary | ICD-10-CM

## 2023-08-07 DIAGNOSIS — D75839 Thrombocytosis, unspecified: Secondary | ICD-10-CM | POA: Insufficient documentation

## 2023-08-07 DIAGNOSIS — D509 Iron deficiency anemia, unspecified: Secondary | ICD-10-CM | POA: Diagnosis not present

## 2023-08-07 LAB — CBC WITH DIFFERENTIAL (CANCER CENTER ONLY)
Abs Immature Granulocytes: 0.08 10*3/uL — ABNORMAL HIGH (ref 0.00–0.07)
Basophils Absolute: 0.1 10*3/uL (ref 0.0–0.1)
Basophils Relative: 1 %
Eosinophils Absolute: 0.1 10*3/uL (ref 0.0–0.5)
Eosinophils Relative: 1 %
HCT: 33.9 % — ABNORMAL LOW (ref 36.0–46.0)
Hemoglobin: 11.7 g/dL — ABNORMAL LOW (ref 12.0–15.0)
Immature Granulocytes: 1 %
Lymphocytes Relative: 6 %
Lymphs Abs: 0.7 10*3/uL (ref 0.7–4.0)
MCH: 45 pg — ABNORMAL HIGH (ref 26.0–34.0)
MCHC: 34.5 g/dL (ref 30.0–36.0)
MCV: 130.4 fL — ABNORMAL HIGH (ref 80.0–100.0)
Monocytes Absolute: 0.7 10*3/uL (ref 0.1–1.0)
Monocytes Relative: 6 %
Neutro Abs: 9.7 10*3/uL — ABNORMAL HIGH (ref 1.7–7.7)
Neutrophils Relative %: 85 %
Platelet Count: 920 10*3/uL (ref 150–400)
RBC: 2.6 MIL/uL — ABNORMAL LOW (ref 3.87–5.11)
RDW: 13.6 % (ref 11.5–15.5)
WBC Count: 11.4 10*3/uL — ABNORMAL HIGH (ref 4.0–10.5)
nRBC: 0 % (ref 0.0–0.2)
nRBC: 0 /100{WBCs}

## 2023-08-07 LAB — FOLATE: Folate: 7.5 ng/mL (ref >5.9)

## 2023-08-07 LAB — IRON AND TIBC
Iron: 98 ug/dL (ref 28–170)
Saturation Ratios: 37 % — ABNORMAL HIGH (ref 10.4–31.8)
TIBC: 263 ug/dL (ref 250–450)
UIBC: 165 ug/dL

## 2023-08-07 LAB — FERRITIN: Ferritin: 87 ng/mL (ref 11–307)

## 2023-08-07 LAB — VITAMIN B12: Vitamin B-12: 297 pg/mL (ref 180–914)

## 2023-08-07 NOTE — Telephone Encounter (Signed)
 Latest Reference Range & Units 08/07/23 10:18 08/07/23 10:19  Iron 28 - 170 ug/dL  98  UIBC ug/dL  409  TIBC 811 - 914 ug/dL  782  Saturation Ratios 10.4 - 31.8 %  37 (H)  Ferritin 11 - 307 ng/mL  87  Folate >5.9 ng/mL  7.5  Vitamin B12 180 - 914 pg/mL 297   (H): Data is abnormally high

## 2023-08-07 NOTE — Telephone Encounter (Signed)
 CRITICAL VALUE STICKER  CRITICAL VALUE: Platelets 920  RECEIVER (on-site recipient of call): Gerhard Rappaport,RN  DATE & TIME NOTIFIED: 08/07/2023 @ 1035  MESSENGER (representative from lab): Vonna Kotyk in lab  MD NOTIFIED: Dr Melvyn Neth  TIME OF NOTIFICATION:  08/07/23 2 1038  RESPONSE:  Pt in clinic to see Dr Melvyn Neth.

## 2023-08-08 ENCOUNTER — Encounter: Payer: Self-pay | Admitting: Oncology

## 2023-09-24 ENCOUNTER — Ambulatory Visit: Payer: Medicare Other | Admitting: Cardiology

## 2023-10-08 NOTE — Progress Notes (Signed)
 [facility]  [address] [place],  [place]  [address] [phone]  Clinic Day:   10/09/2023  Referring physician: Olga Berthold, NP  HISTORY OF PRESENT ILLNESS:  The patient is a 78 y.o. female with essential thrombocythemia.  Due to a recent rise in her platelets despite recently receiving IV iron, she has been taking hydroxyurea  500 mg twice daily.   She comes in today to reassess her peripheral counts after the recent increase in her hydroxyurea .  Since her last visit, the patient has been doing okay.  She has occasional periods of weakness, but denies having clotting complications from her underlying disease.    PHYSICAL EXAM:  Blood pressure (!) 161/91, pulse 87, temperature 98 F (36.7 C), temperature source Oral, resp. rate 16, height 5' (1.524 m), weight 118 lb 4.8 oz (53.7 kg), SpO2 99%. Wt Readings from Last 3 Encounters:  10/09/23 118 lb 4.8 oz (53.7 kg)  08/07/23 120 lb 4.8 oz (54.6 kg)  05/25/23 116 lb 12.8 oz (53 kg)   Body mass index is 23.1 kg/m. Performance status (ECOG): 1 Physical Exam Constitutional:      Appearance: Normal appearance. She is not ill-appearing.     Comments: She is ambulating with a walker  HENT:     Mouth/Throat:     Mouth: Mucous membranes are moist.     Pharynx: Oropharynx is clear. No oropharyngeal exudate or posterior oropharyngeal erythema.  Cardiovascular:     Rate and Rhythm: Normal rate and regular rhythm.     Heart sounds: No murmur heard.    No friction rub. No gallop.  Pulmonary:     Effort: Pulmonary effort is normal. No respiratory distress.     Breath sounds: Normal breath sounds. No wheezing, rhonchi or rales.  Abdominal:     General: Bowel sounds are normal. There is no distension.     Palpations: Abdomen is soft. There is no mass.     Tenderness: There is no abdominal tenderness.  Musculoskeletal:        General: No swelling.     Right lower leg: No edema.     Left lower leg: No  edema.  Lymphadenopathy:     Cervical: No cervical adenopathy.     Upper Body:     Right upper body: No supraclavicular or axillary adenopathy.     Left upper body: No supraclavicular or axillary adenopathy.     Lower Body: No right inguinal adenopathy. No left inguinal adenopathy.  Skin:    General: Skin is warm.     Coloration: Skin is not jaundiced.     Findings: No lesion or rash.  Neurological:     General: No focal deficit present.     Mental Status: She is alert and oriented to person, place, and time. Mental status is at baseline.  Psychiatric:        Mood and Affect: Mood normal.        Behavior: Behavior normal.        Thought Content: Thought content normal.    LABS:      Latest Ref Rng & Units 10/09/2023   10:33 AM 08/07/2023   10:18 AM 04/10/2023   10:33 AM  CBC  WBC 4.0 - 10.5 K/uL 8.4  11.4  8.8   Hemoglobin 12.0 - 15.0 g/dL 13.2  44.0  10.2   Hematocrit 36.0 - 46.0 % 33.2  33.9  32.0   Platelets 150 - 400 K/uL 924  920  668     Latest Reference Range & Units 04/10/23 10:33 08/07/23 10:19  Iron 28 - 170 ug/dL 62 98  UIBC ug/dL 409 811  TIBC 914 - 782 ug/dL 956 213  Saturation Ratios 10.4 - 31.8 % 18 37 (H)  Ferritin 11 - 307 ng/mL 11 87  (H): Data is abnormally high  ASSESSMENT & PLAN:  Assessment/Plan:  A 78 y.o. female with essential thrombocythemia and iron deficiency anemia.  Despite the increase in her hydroxyurea , her platelets have not improved.  Based upon this, I will increase her hydroxyurea  to 500 mg TID on Mondays, Wednesdays and Fridays.  Her hydroxyurea  will remain 500 mg twice daily for the other 4 days of the week.    Ultimately, the goal is to get her platelets below 400-600.  I will see this patient back in 6 weeks to reassess her peripheral counts to see how well she responded to the increase in her hydroxyurea .  The patient understands all the plans discussed today and is in agreement with them.    Mustaf Antonacci Felicia Horde, MD

## 2023-10-09 ENCOUNTER — Telehealth: Payer: Self-pay | Admitting: Oncology

## 2023-10-09 ENCOUNTER — Inpatient Hospital Stay

## 2023-10-09 ENCOUNTER — Telehealth: Payer: Self-pay

## 2023-10-09 ENCOUNTER — Inpatient Hospital Stay: Attending: Oncology | Admitting: Oncology

## 2023-10-09 VITALS — BP 161/91 | HR 87 | Temp 98.0°F | Resp 16 | Ht 60.0 in | Wt 118.3 lb

## 2023-10-09 DIAGNOSIS — D473 Essential (hemorrhagic) thrombocythemia: Secondary | ICD-10-CM

## 2023-10-09 DIAGNOSIS — D75839 Thrombocytosis, unspecified: Secondary | ICD-10-CM | POA: Insufficient documentation

## 2023-10-09 DIAGNOSIS — D509 Iron deficiency anemia, unspecified: Secondary | ICD-10-CM | POA: Insufficient documentation

## 2023-10-09 LAB — CBC WITH DIFFERENTIAL (CANCER CENTER ONLY)
Abs Immature Granulocytes: 0.06 10*3/uL (ref 0.00–0.07)
Basophils Absolute: 0.1 10*3/uL (ref 0.0–0.1)
Basophils Relative: 1 %
Eosinophils Absolute: 0 10*3/uL (ref 0.0–0.5)
Eosinophils Relative: 1 %
HCT: 33.2 % — ABNORMAL LOW (ref 36.0–46.0)
Hemoglobin: 11.5 g/dL — ABNORMAL LOW (ref 12.0–15.0)
Immature Granulocytes: 1 %
Lymphocytes Relative: 7 %
Lymphs Abs: 0.6 10*3/uL — ABNORMAL LOW (ref 0.7–4.0)
MCH: 45.5 pg — ABNORMAL HIGH (ref 26.0–34.0)
MCHC: 34.6 g/dL (ref 30.0–36.0)
MCV: 131.2 fL — ABNORMAL HIGH (ref 80.0–100.0)
Monocytes Absolute: 0.5 10*3/uL (ref 0.1–1.0)
Monocytes Relative: 6 %
Neutro Abs: 7.1 10*3/uL (ref 1.7–7.7)
Neutrophils Relative %: 84 %
Platelet Count: 924 10*3/uL (ref 150–400)
RBC: 2.53 MIL/uL — ABNORMAL LOW (ref 3.87–5.11)
RDW: 14.5 % (ref 11.5–15.5)
WBC Count: 8.4 10*3/uL (ref 4.0–10.5)
nRBC: 0 % (ref 0.0–0.2)

## 2023-10-09 LAB — IRON AND TIBC
Iron: 118 ug/dL (ref 28–170)
Saturation Ratios: 48 % — ABNORMAL HIGH (ref 10.4–31.8)
TIBC: 245 ug/dL — ABNORMAL LOW (ref 250–450)
UIBC: 127 ug/dL

## 2023-10-09 LAB — FERRITIN: Ferritin: 102 ng/mL (ref 11–307)

## 2023-10-09 NOTE — Telephone Encounter (Signed)
 CRITICAL VALUE STICKER  CRITICAL VALUE:  Platelets [id number]  RECEIVER (on-site recipient of call): Clide Remmers,RN  DATE & TIME NOTIFIED: 10/09/23 @ 1049  MESSENGER (representative from lab): Jullie Oiler in lab  MD NOTIFIED: Dr Harles Lied  TIME OF NOTIFICATION:  10/09/23 @ 1052  RESPONSE:  "Ok" per Dr Harles Lied.

## 2023-10-09 NOTE — Telephone Encounter (Signed)
 Patient has been scheduled for follow-up visit per 10/08/23 LOS.  Pt aware of scheduled appt details.

## 2023-10-16 DIAGNOSIS — D473 Essential (hemorrhagic) thrombocythemia: Secondary | ICD-10-CM | POA: Diagnosis not present

## 2023-10-16 DIAGNOSIS — Z6820 Body mass index (BMI) 20.0-20.9, adult: Secondary | ICD-10-CM | POA: Diagnosis not present

## 2023-10-16 DIAGNOSIS — G8929 Other chronic pain: Secondary | ICD-10-CM | POA: Diagnosis not present

## 2023-10-16 DIAGNOSIS — E538 Deficiency of other specified B group vitamins: Secondary | ICD-10-CM | POA: Diagnosis not present

## 2023-10-16 DIAGNOSIS — I5042 Chronic combined systolic (congestive) and diastolic (congestive) heart failure: Secondary | ICD-10-CM | POA: Diagnosis not present

## 2023-10-16 DIAGNOSIS — D509 Iron deficiency anemia, unspecified: Secondary | ICD-10-CM | POA: Diagnosis not present

## 2023-10-16 DIAGNOSIS — M545 Low back pain, unspecified: Secondary | ICD-10-CM | POA: Diagnosis not present

## 2023-10-16 DIAGNOSIS — E039 Hypothyroidism, unspecified: Secondary | ICD-10-CM | POA: Diagnosis not present

## 2023-10-16 DIAGNOSIS — E440 Moderate protein-calorie malnutrition: Secondary | ICD-10-CM | POA: Diagnosis not present

## 2023-10-16 DIAGNOSIS — M2550 Pain in unspecified joint: Secondary | ICD-10-CM | POA: Diagnosis not present

## 2023-10-16 DIAGNOSIS — I10 Essential (primary) hypertension: Secondary | ICD-10-CM | POA: Diagnosis not present

## 2023-10-16 DIAGNOSIS — R531 Weakness: Secondary | ICD-10-CM | POA: Diagnosis not present

## 2023-10-27 ENCOUNTER — Encounter: Payer: Self-pay | Admitting: Oncology

## 2023-10-27 ENCOUNTER — Other Ambulatory Visit (HOSPITAL_BASED_OUTPATIENT_CLINIC_OR_DEPARTMENT_OTHER): Payer: Self-pay

## 2023-10-27 ENCOUNTER — Other Ambulatory Visit: Payer: Self-pay | Admitting: Hematology and Oncology

## 2023-10-27 ENCOUNTER — Other Ambulatory Visit: Payer: Self-pay

## 2023-10-27 DIAGNOSIS — D473 Essential (hemorrhagic) thrombocythemia: Secondary | ICD-10-CM

## 2023-10-27 MED ORDER — HYDROXYUREA 500 MG PO CAPS: 500.0000 mg | ORAL_CAPSULE | ORAL | 3 refills | Status: DC

## 2023-10-27 NOTE — Progress Notes (Unsigned)
 Dose change to Hydroxyurea  500mg  TID on Mon, Wed, Fri and Hydroxyurea  500mg  BID on T,Th, Sat and Sun per Dr Harles Lied' last office note.

## 2023-11-03 DIAGNOSIS — I10 Essential (primary) hypertension: Secondary | ICD-10-CM | POA: Diagnosis not present

## 2023-11-03 DIAGNOSIS — E538 Deficiency of other specified B group vitamins: Secondary | ICD-10-CM | POA: Diagnosis not present

## 2023-11-03 DIAGNOSIS — R600 Localized edema: Secondary | ICD-10-CM | POA: Diagnosis not present

## 2023-11-19 ENCOUNTER — Other Ambulatory Visit: Payer: Self-pay | Admitting: Oncology

## 2023-11-19 DIAGNOSIS — D473 Essential (hemorrhagic) thrombocythemia: Secondary | ICD-10-CM

## 2023-11-19 NOTE — Progress Notes (Deleted)
 [facility]  [address] [place],  [place]  [address] [phone]  Clinic Day:   10/09/2023  Referring physician: Olga Berthold, NP  HISTORY OF PRESENT ILLNESS:  The patient is a 78 y.o. female with essential thrombocythemia.  Due to a recent rise in her platelets despite recently receiving IV iron, she has been taking hydroxyurea  500 mg twice daily.   She comes in today to reassess her peripheral counts after the recent increase in her hydroxyurea .  Since her last visit, the patient has been doing okay.  She has occasional periods of weakness, but denies having clotting complications from her underlying disease.    PHYSICAL EXAM:  There were no vitals taken for this visit. Wt Readings from Last 3 Encounters:  10/09/23 118 lb 4.8 oz (53.7 kg)  08/07/23 120 lb 4.8 oz (54.6 kg)  05/25/23 116 lb 12.8 oz (53 kg)   There is no height or weight on file to calculate BMI. Performance status (ECOG): 1 Physical Exam Constitutional:      Appearance: Normal appearance. She is not ill-appearing.     Comments: She is ambulating with a walker  HENT:     Mouth/Throat:     Mouth: Mucous membranes are moist.     Pharynx: Oropharynx is clear. No oropharyngeal exudate or posterior oropharyngeal erythema.   Cardiovascular:     Rate and Rhythm: Normal rate and regular rhythm.     Heart sounds: No murmur heard.    No friction rub. No gallop.  Pulmonary:     Effort: Pulmonary effort is normal. No respiratory distress.     Breath sounds: Normal breath sounds. No wheezing, rhonchi or rales.  Abdominal:     General: Bowel sounds are normal. There is no distension.     Palpations: Abdomen is soft. There is no mass.     Tenderness: There is no abdominal tenderness.   Musculoskeletal:        General: No swelling.     Right lower leg: No edema.     Left lower leg: No edema.  Lymphadenopathy:     Cervical: No cervical adenopathy.     Upper Body:     Right upper  body: No supraclavicular or axillary adenopathy.     Left upper body: No supraclavicular or axillary adenopathy.     Lower Body: No right inguinal adenopathy. No left inguinal adenopathy.   Skin:    General: Skin is warm.     Coloration: Skin is not jaundiced.     Findings: No lesion or rash.   Neurological:     General: No focal deficit present.     Mental Status: She is alert and oriented to person, place, and time. Mental status is at baseline.   Psychiatric:        Mood and Affect: Mood normal.        Behavior: Behavior normal.        Thought Content: Thought content normal.    LABS:      Latest Ref Rng & Units 10/09/2023   10:33 AM 08/07/2023   10:18 AM 04/10/2023   10:33 AM  CBC  WBC 4.0 - 10.5 K/uL 8.4  11.4  8.8   Hemoglobin 12.0 - 15.0 g/dL 40.1  02.7  25.3   Hematocrit 36.0 - 46.0 % 33.2  33.9  32.0   Platelets 150 - 400 K/uL 924  920  668     Latest Reference Range & Units  04/10/23 10:33 08/07/23 10:19  Iron 28 - 170 ug/dL 62 98  UIBC ug/dL 130 865  TIBC 784 - 696 ug/dL 295 284  Saturation Ratios 10.4 - 31.8 % 18 37 (H)  Ferritin 11 - 307 ng/mL 11 87  (H): Data is abnormally high  ASSESSMENT & PLAN:  Assessment/Plan:  A 78 y.o. female with essential thrombocythemia and iron deficiency anemia.  Despite the increase in her hydroxyurea , her platelets have not improved.  Based upon this, I will increase her hydroxyurea  to 500 mg TID on Mondays, Wednesdays and Fridays.  Her hydroxyurea  will remain 500 mg twice daily for the other 4 days of the week.    Ultimately, the goal is to get her platelets below 400-600.  I will see this patient back in 6 weeks to reassess her peripheral counts to see how well she responded to the increase in her hydroxyurea .  The patient understands all the plans discussed today and is in agreement with them.    Shanequa Whitenight Felicia Horde, MD

## 2023-11-20 ENCOUNTER — Telehealth: Payer: Self-pay | Admitting: Oncology

## 2023-11-20 ENCOUNTER — Inpatient Hospital Stay: Admitting: Oncology

## 2023-11-20 ENCOUNTER — Inpatient Hospital Stay

## 2023-11-20 NOTE — Telephone Encounter (Signed)
 11/20/23 Daughter called and cancelled appts.

## 2023-11-26 NOTE — Progress Notes (Signed)
 [facility]  [address] [place],  [place]  [address] [phone]  Clinic Day:   11/27/2023  Referring physician: Erick Greig LABOR, NP  HISTORY OF PRESENT ILLNESS:  The patient is a 78 y.o. female with essential thrombocythemia.  She comes in today to reassess her peripheral counts after the recent increase in her hydroxyurea .  For the past 6 weeks, the patient has been taking hydroxyurea  500 mg 3 times daily every Monday, Wednesday, and Friday.  She took hydroxyurea  500 mg twice daily for the other 4 days of the week.  The patient brings to my attention that she has gotten much weaker over the past week.  Based upon this, she went back down to 500 mg twice daily for every single day over the past week.  She definitely has not felt well over these past few weeks.  PHYSICAL EXAM:  Blood pressure (!) 148/75, pulse (!) 110, temperature 98.1 F (36.7 C), temperature source Oral, resp. rate 16, height 5' (1.524 m), weight 120 lb 1.6 oz (54.5 kg), SpO2 100%. Wt Readings from Last 3 Encounters:  11/27/23 120 lb 1.6 oz (54.5 kg)  10/09/23 118 lb 4.8 oz (53.7 kg)  08/07/23 120 lb 4.8 oz (54.6 kg)   Body mass index is 23.46 kg/m. Performance status (ECOG): 1 Physical Exam Constitutional:      Appearance: Normal appearance. She is not ill-appearing.     Comments: She is ambulating with a walker  HENT:     Mouth/Throat:     Mouth: Mucous membranes are moist.     Pharynx: Oropharynx is clear. No oropharyngeal exudate or posterior oropharyngeal erythema.   Cardiovascular:     Rate and Rhythm: Normal rate and regular rhythm.     Heart sounds: No murmur heard.    No friction rub. No gallop.  Pulmonary:     Effort: Pulmonary effort is normal. No respiratory distress.     Breath sounds: Normal breath sounds. No wheezing, rhonchi or rales.  Abdominal:     General: Bowel sounds are normal. There is no distension.     Palpations: Abdomen is soft. There is no mass.      Tenderness: There is no abdominal tenderness.   Musculoskeletal:        General: No swelling.     Right lower leg: No edema.     Left lower leg: No edema.  Lymphadenopathy:     Cervical: No cervical adenopathy.     Upper Body:     Right upper body: No supraclavicular or axillary adenopathy.     Left upper body: No supraclavicular or axillary adenopathy.     Lower Body: No right inguinal adenopathy. No left inguinal adenopathy.   Skin:    General: Skin is warm.     Coloration: Skin is not jaundiced.     Findings: No lesion or rash.   Neurological:     General: No focal deficit present.     Mental Status: She is alert and oriented to person, place, and time. Mental status is at baseline.   Psychiatric:        Mood and Affect: Mood normal.        Behavior: Behavior normal.        Thought Content: Thought content normal.    LABS:      Latest Ref Rng & Units 11/27/2023   10:44 AM 10/09/2023   10:33 AM 08/07/2023   10:18 AM  CBC  WBC 4.0 -  10.5 K/uL 7.8  8.4  11.4   Hemoglobin 12.0 - 15.0 g/dL 9.3  88.4  88.2   Hematocrit 36.0 - 46.0 % 26.9  33.2  33.9   Platelets 150 - 400 K/uL 1,059  924  920     Latest Reference Range & Units 08/07/23 10:18 10/09/23 10:33 11/27/23 10:44  MCV 80.0 - 100.0 fL 130.4 (H) 131.2 (H) 143.9 (H)  (H): Data is abnormally high  Latest Reference Range & Units 11/27/23 10:44  Iron 28 - 170 ug/dL 68  UIBC ug/dL 828  TIBC 749 - 549 ug/dL 760 (L)  Saturation Ratios 10.4 - 31.8 % 28  Ferritin 11 - 307 ng/mL 139  Folate >5.9 ng/mL 8.1  Vitamin B12 180 - 914 pg/mL 422  (L): Data is abnormally low  Her peripheral smear is pertinent for numerous platelets, macrocytes, and occasional teardrop red blood cells.  ASSESSMENT & PLAN:  Assessment/Plan:  An 78 y.o. female with essential thrombocythemia and iron deficiency anemia.  Despite the increase in her hydroxyurea , her platelets have continued to rise.  Unfortunately, the increase in hydroxyurea  led to  a notable drop in her hemoglobin.  I am concerned there may be more going on in this patient's bone marrow than just essential thrombocythemia.  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  To further confirm this, a bone marrow biopsy will be done next week.  I will see her back 2 weeks later to go over her bone marrow biopsy results and their implications.  For now, I have no problem with the patient going back to hydroxyurea  500 mg twice daily on a daily basis.  The patient understands all the plans discussed today and is in agreement with them.    Hagen Tidd DELENA Kerns, MD

## 2023-11-27 ENCOUNTER — Other Ambulatory Visit: Payer: Self-pay

## 2023-11-27 ENCOUNTER — Inpatient Hospital Stay: Attending: Oncology

## 2023-11-27 ENCOUNTER — Other Ambulatory Visit: Payer: Self-pay | Admitting: Oncology

## 2023-11-27 ENCOUNTER — Encounter: Payer: Self-pay | Admitting: Oncology

## 2023-11-27 ENCOUNTER — Inpatient Hospital Stay (HOSPITAL_BASED_OUTPATIENT_CLINIC_OR_DEPARTMENT_OTHER): Admitting: Oncology

## 2023-11-27 VITALS — BP 148/75 | HR 110 | Temp 98.1°F | Resp 16 | Ht 60.0 in | Wt 120.1 lb

## 2023-11-27 DIAGNOSIS — D75839 Thrombocytosis, unspecified: Secondary | ICD-10-CM | POA: Insufficient documentation

## 2023-11-27 DIAGNOSIS — D473 Essential (hemorrhagic) thrombocythemia: Secondary | ICD-10-CM

## 2023-11-27 DIAGNOSIS — D509 Iron deficiency anemia, unspecified: Secondary | ICD-10-CM | POA: Diagnosis not present

## 2023-11-27 LAB — CBC WITH DIFFERENTIAL (CANCER CENTER ONLY)
Abs Immature Granulocytes: 0.09 10*3/uL — ABNORMAL HIGH (ref 0.00–0.07)
Basophils Absolute: 0 10*3/uL (ref 0.0–0.1)
Basophils Relative: 0 %
Eosinophils Absolute: 0 10*3/uL (ref 0.0–0.5)
Eosinophils Relative: 0 %
HCT: 26.9 % — ABNORMAL LOW (ref 36.0–46.0)
Hemoglobin: 9.3 g/dL — ABNORMAL LOW (ref 12.0–15.0)
Immature Granulocytes: 1 %
Lymphocytes Relative: 5 %
Lymphs Abs: 0.4 10*3/uL — ABNORMAL LOW (ref 0.7–4.0)
MCH: 49.7 pg — ABNORMAL HIGH (ref 26.0–34.0)
MCHC: 34.6 g/dL (ref 30.0–36.0)
MCV: 143.9 fL — ABNORMAL HIGH (ref 80.0–100.0)
Monocytes Absolute: 0.5 10*3/uL (ref 0.1–1.0)
Monocytes Relative: 7 %
Neutro Abs: 6.7 10*3/uL (ref 1.7–7.7)
Neutrophils Relative %: 87 %
Platelet Count: 1059 10*3/uL (ref 150–400)
RBC: 1.87 MIL/uL — ABNORMAL LOW (ref 3.87–5.11)
RDW: 16.7 % — ABNORMAL HIGH (ref 11.5–15.5)
WBC Count: 7.8 10*3/uL (ref 4.0–10.5)
nRBC: 0.3 % — ABNORMAL HIGH (ref 0.0–0.2)

## 2023-11-27 LAB — FOLATE: Folate: 8.1 ng/mL (ref >5.9)

## 2023-11-27 LAB — IRON AND TIBC
Iron: 68 ug/dL (ref 28–170)
Saturation Ratios: 28 % (ref 10.4–31.8)
TIBC: 239 ug/dL — ABNORMAL LOW (ref 250–450)
UIBC: 171 ug/dL

## 2023-11-27 LAB — FERRITIN: Ferritin: 139 ng/mL (ref 11–307)

## 2023-11-27 LAB — VITAMIN B12: Vitamin B-12: 422 pg/mL (ref 180–914)

## 2023-11-27 NOTE — Progress Notes (Signed)
 CRITICAL VALUE STICKER  CRITICAL VALUE:  Plt [id number]  RECEIVER (on-site recipient of call):  Woodie Kapur RN  DATE & TIME NOTIFIED:   11/27/2023 @ 1056  MESSENGER (representative from lab):  [facility]  MD NOTIFIED:   Dr. Ezzard   TIME OF NOTIFICATION:  1102  RESPONSE: Scheduled to this morning.

## 2023-11-28 ENCOUNTER — Encounter: Payer: Self-pay | Admitting: Oncology

## 2023-12-02 ENCOUNTER — Other Ambulatory Visit: Payer: Self-pay | Admitting: Oncology

## 2023-12-02 ENCOUNTER — Inpatient Hospital Stay: Attending: Oncology

## 2023-12-02 ENCOUNTER — Inpatient Hospital Stay (HOSPITAL_BASED_OUTPATIENT_CLINIC_OR_DEPARTMENT_OTHER): Admitting: Oncology

## 2023-12-02 ENCOUNTER — Other Ambulatory Visit: Payer: Self-pay

## 2023-12-02 ENCOUNTER — Telehealth: Payer: Self-pay | Admitting: Oncology

## 2023-12-02 VITALS — BP 159/101 | HR 91 | Temp 98.8°F | Resp 16 | Ht 60.0 in | Wt 121.9 lb

## 2023-12-02 DIAGNOSIS — D75839 Thrombocytosis, unspecified: Secondary | ICD-10-CM | POA: Diagnosis not present

## 2023-12-02 DIAGNOSIS — D649 Anemia, unspecified: Secondary | ICD-10-CM | POA: Diagnosis not present

## 2023-12-02 DIAGNOSIS — Z79899 Other long term (current) drug therapy: Secondary | ICD-10-CM | POA: Diagnosis not present

## 2023-12-02 DIAGNOSIS — D473 Essential (hemorrhagic) thrombocythemia: Secondary | ICD-10-CM

## 2023-12-02 DIAGNOSIS — D7589 Other specified diseases of blood and blood-forming organs: Secondary | ICD-10-CM | POA: Diagnosis not present

## 2023-12-02 LAB — CBC WITH DIFFERENTIAL (CANCER CENTER ONLY)
Abs Immature Granulocytes: 0.07 10*3/uL (ref 0.00–0.07)
Basophils Absolute: 0 10*3/uL (ref 0.0–0.1)
Basophils Relative: 0 %
Eosinophils Absolute: 0 10*3/uL (ref 0.0–0.5)
Eosinophils Relative: 0 %
HCT: 26.4 % — ABNORMAL LOW (ref 36.0–46.0)
Hemoglobin: 8.7 g/dL — ABNORMAL LOW (ref 12.0–15.0)
Immature Granulocytes: 1 %
Lymphocytes Relative: 5 %
Lymphs Abs: 0.3 10*3/uL — ABNORMAL LOW (ref 0.7–4.0)
MCH: 47.5 pg — ABNORMAL HIGH (ref 26.0–34.0)
MCHC: 33 g/dL (ref 30.0–36.0)
MCV: 144.3 fL — ABNORMAL HIGH (ref 80.0–100.0)
Monocytes Absolute: 0.6 10*3/uL (ref 0.1–1.0)
Monocytes Relative: 8 %
Neutro Abs: 6.2 10*3/uL (ref 1.7–7.7)
Neutrophils Relative %: 86 %
Platelet Count: 1074 10*3/uL (ref 150–400)
RBC: 1.83 MIL/uL — ABNORMAL LOW (ref 3.87–5.11)
RDW: 16.7 % — ABNORMAL HIGH (ref 11.5–15.5)
WBC Count: 7.2 10*3/uL (ref 4.0–10.5)
nRBC: 0 % (ref 0.0–0.2)

## 2023-12-02 NOTE — Progress Notes (Signed)
 CRITICAL VALUE STICKER  CRITICAL VALUE:  Plt [phone]  RECEIVER (on-site recipient of call):  Woodie Kapur RN   DATE & TIME NOTIFIED:   12/02/2023 @ [phone]  MESSENGER (representative from lab):  [facility]  MD NOTIFIED:   Dr. Ezzard   TIME OF NOTIFICATION:  0930  RESPONSE:  Patient having bone marrow this morning.

## 2023-12-02 NOTE — Telephone Encounter (Signed)
 Patient has been scheduled for follow-up visit per 12/02/23 LOS.  Pt aware of scheduled appt details.

## 2023-12-02 NOTE — Progress Notes (Signed)
 BONE MARROW PROCEDURE NOTE Due to the patient's progressive thrombocythemia that has been unresponsive to hydroxyurea , a bone marrow biopsy was done today for further evaluation.  After consenting for the procedure, the patient's left posterior superior iliac crest was topically sterilized.  Afterwards, a bone marrow core and aspirate were collected, which will be sent for flow cytometry, cytogenetics, and a Neocomprehensive myeloid panel.  There were no complications experienced with this procedure.

## 2023-12-09 LAB — SURGICAL PATHOLOGY

## 2023-12-11 ENCOUNTER — Encounter (HOSPITAL_COMMUNITY): Payer: Self-pay

## 2023-12-16 ENCOUNTER — Other Ambulatory Visit

## 2023-12-16 ENCOUNTER — Ambulatory Visit: Admitting: Oncology

## 2023-12-17 NOTE — Progress Notes (Unsigned)
 [facility]  [address] [place],  [place]  [address] [phone]  Clinic Day:   12/18/2023  Referring physician: Erick Greig LABOR, NP  HISTORY OF PRESENT ILLNESS:  The patient is a 78 y.o. female with essential thrombocythemia.  Due to the progressive rise in her platelets and decline in her hemoglobin, a bone marrow biopsy was done to ensure no other intrinsic marrow disorder is present.  The patient comes in today feeling weak.  However, her most pressing issue is significant swelling and weeping in her legs, which has progressed over the past 1-2 weeks.  She had this issue before a few years ago, which required her legs to be wrapped for a brief period of time.  As a pertains to her underlying disease, she denies having any clotting complications.  PHYSICAL EXAM:  Blood pressure (!) 157/89, pulse 94, temperature 98 F (36.7 C), temperature source Oral, resp. rate 16, height 5' (1.524 m), weight 124 lb 8 oz (56.5 kg), SpO2 100%. Wt Readings from Last 3 Encounters:  12/18/23 124 lb 8 oz (56.5 kg)  12/02/23 121 lb 14.4 oz (55.3 kg)  11/27/23 120 lb 1.6 oz (54.5 kg)   Body mass index is 24.31 kg/m. Performance status (ECOG): 2 Physical Exam Constitutional:      Appearance: Normal appearance. She is not ill-appearing.     Comments: She is ambulating with a walker  HENT:     Mouth/Throat:     Mouth: Mucous membranes are moist.     Pharynx: Oropharynx is clear. No oropharyngeal exudate or posterior oropharyngeal erythema.  Cardiovascular:     Rate and Rhythm: Normal rate and regular rhythm.     Heart sounds: No murmur heard.    No friction rub. No gallop.  Pulmonary:     Effort: Pulmonary effort is normal. No respiratory distress.     Breath sounds: Normal breath sounds. No wheezing, rhonchi or rales.  Abdominal:     General: Bowel sounds are normal. There is no distension.     Palpations: Abdomen is soft. There is no mass.     Tenderness: There  is no abdominal tenderness.  Musculoskeletal:        General: No swelling.     Right lower leg: 2+ Edema present.     Left lower leg: 2+ Edema present.     Comments: Weeping seen from both legs/feet (R>L)  Lymphadenopathy:     Cervical: No cervical adenopathy.     Upper Body:     Right upper body: No supraclavicular or axillary adenopathy.     Left upper body: No supraclavicular or axillary adenopathy.     Lower Body: No right inguinal adenopathy. No left inguinal adenopathy.  Skin:    General: Skin is warm.     Coloration: Skin is not jaundiced.     Findings: No lesion or rash.  Neurological:     General: No focal deficit present.     Mental Status: She is alert and oriented to person, place, and time. Mental status is at baseline.  Psychiatric:        Mood and Affect: Mood normal.        Behavior: Behavior normal.        Thought Content: Thought content normal.    LABS:      Latest Ref Rng & Units 12/18/2023    9:49 AM 12/02/2023    8:55 AM 11/27/2023   10:44 AM  CBC  WBC 4.0 -  10.5 K/uL 8.1  7.2  7.8   Hemoglobin 12.0 - 15.0 g/dL 8.9  8.7  9.3   Hematocrit 36.0 - 46.0 % 26.7  26.4  26.9   Platelets 150 - 400 K/uL 869  1,074  1,059     Latest Reference Range & Units 11/27/23 10:44 12/02/23 08:55 12/18/23 09:49  MCV 80.0 - 100.0 fL 143.9 (H) 144.3 (H) 148.3 (H)  (H): Data is abnormally high  Latest Reference Range & Units 11/27/23 10:44  Iron 28 - 170 ug/dL 68  UIBC ug/dL 828  TIBC 749 - 549 ug/dL 760 (L)  Saturation Ratios 10.4 - 31.8 % 28  Ferritin 11 - 307 ng/mL 139  Folate >5.9 ng/mL 8.1  Vitamin B12 180 - 914 pg/mL 422  (L): Data is abnormally low  Her peripheral smear is pertinent for numerous platelets, macrocytes, and occasional teardrop red blood cells.  PATHOLOGY:  Her bone marrow biopsy revealed the following: BONE MARROW, ASPIRATE, CLOT, CORE:  - Cellular bone marrow with megakaryocytic hyperplasia and otherwise  orderly myeloid and erythroid  maturation   PERIPHERAL BLOOD:  - Thrombocytosis   BONE MARROW ASPIRATE: The bone marrow aspirate smear slides contain  several cellular bone marrow spicules which are adequate for  interpretation.  Erythroid precursors:  The erythroid series is present in adequate  number with orderly maturation and unremarkable morphology.  Granulocytic precursors: The myeloid series is present in adequate  number with orderly maturation and unremarkable morphology.  Megakaryocytes: The megakaryocytes are present in increased number some  with hyper lobulation as well as a subset with hyper lobulation.  Lymphocytes/plasma cells: Lymphocytes and plasma cells are not  increased.   CLOT AND BIOPSY: The biopsy consists of a very scant core of periosteum and cortical bone with significant aspiration artifact and is overall suboptimal to inadequate for evaluation.  There is very limited hematopoietic marrow for evaluation.  It is noted that there are increased megakaryocytes with clustering.  The clot section has a limited number of hematopoietic spicules ranging in cellularity from 5% to focally 50% with an overall cellularity interpreted as 20% on limited material and possible subcortical location given the biopsy.  There is significant megakaryocytic clustering present with abnormally lobulated megakaryocytes.  There is otherwise orderly myeloid and erythroid maturation.   IMMUNOHISTOCHEMICAL STAINS: Immunohistochemical stains were performed in both biopsy and clot section.  There is no increase in CD34/CD117 blast/immature precursors.  E-cadherin and myeloperoxidase highlight the erythroid and myeloid compartment respectively.  SPECIAL STAINS: Trichrome and reticulin stains were not performed to evaluate for the presence of fibrosis due to the limited material on the biopsy.   IRON STAIN: Iron stains are performed on a bone marrow aspirate or touch  imprint smear and section of clot. The  controls stained appropriately.        Storage Iron: Adequate histiocytic iron stores       Ring Sideroblasts: Not identified     ASSESSMENT & PLAN:  Assessment/Plan:  An 78 y.o. female with essential thrombocythemia.  In clinic today, I went over her bone marrow biopsy results with her.  It appears that she only has persistent essential thrombocythemia.  Although studies could not be done to specifically check for myelofibrosis, the fact that her bone marrow was easily penetrable suggests this likely is not the case.  Although her platelets are better today, they are still somewhat elevated.  I am even more concerned about her persistent anemia.  Based upon this, I will start this  patient on monthly Retacrit shots at 20,000 units.  Ultimately, the goal is to get her hemoglobin at least above 10.  With respect to her thrombocythemia, she will continue to take hydroxyurea  500 mg twice daily.  She also knows it will be important to continue her daily baby aspirin  as both underlying essential thrombocythemia and Retacrit shots could increase her risk of clots over time.  Her CBC will be checked monthly over these next few months to see how well she is responding to her Retacrit injections.  I will see her back in 2 months for repeat clinical assessment.  With respect to the weeping in her legs, she knows to speak with her primary care provider about this problem.  I will also try to get home health involved to improve her leg care.  The patient understands all the plans discussed today and is in agreement with them.    Jaheem Hedgepath DELENA Kerns, MD

## 2023-12-18 ENCOUNTER — Inpatient Hospital Stay

## 2023-12-18 ENCOUNTER — Inpatient Hospital Stay: Admitting: Oncology

## 2023-12-18 ENCOUNTER — Encounter: Payer: Self-pay | Admitting: Oncology

## 2023-12-18 ENCOUNTER — Telehealth: Payer: Self-pay | Admitting: Oncology

## 2023-12-18 ENCOUNTER — Other Ambulatory Visit: Payer: Self-pay | Admitting: Oncology

## 2023-12-18 VITALS — BP 169/57 | Resp 16

## 2023-12-18 VITALS — BP 157/89 | HR 94 | Temp 98.0°F | Resp 16 | Ht 60.0 in | Wt 124.5 lb

## 2023-12-18 DIAGNOSIS — D649 Anemia, unspecified: Secondary | ICD-10-CM | POA: Diagnosis not present

## 2023-12-18 DIAGNOSIS — D473 Essential (hemorrhagic) thrombocythemia: Secondary | ICD-10-CM

## 2023-12-18 DIAGNOSIS — D75839 Thrombocytosis, unspecified: Secondary | ICD-10-CM | POA: Diagnosis not present

## 2023-12-18 DIAGNOSIS — Z79899 Other long term (current) drug therapy: Secondary | ICD-10-CM | POA: Diagnosis not present

## 2023-12-18 LAB — CBC WITH DIFFERENTIAL (CANCER CENTER ONLY)
Abs Immature Granulocytes: 0.09 K/uL — ABNORMAL HIGH (ref 0.00–0.07)
Basophils Absolute: 0 K/uL (ref 0.0–0.1)
Basophils Relative: 0 %
Eosinophils Absolute: 0 K/uL (ref 0.0–0.5)
Eosinophils Relative: 0 %
HCT: 26.7 % — ABNORMAL LOW (ref 36.0–46.0)
Hemoglobin: 8.9 g/dL — ABNORMAL LOW (ref 12.0–15.0)
Immature Granulocytes: 1 %
Lymphocytes Relative: 5 %
Lymphs Abs: 0.4 K/uL — ABNORMAL LOW (ref 0.7–4.0)
MCH: 49.4 pg — ABNORMAL HIGH (ref 26.0–34.0)
MCHC: 33.3 g/dL (ref 30.0–36.0)
MCV: 148.3 fL — ABNORMAL HIGH (ref 80.0–100.0)
Monocytes Absolute: 0.6 K/uL (ref 0.1–1.0)
Monocytes Relative: 8 %
Neutro Abs: 7 K/uL (ref 1.7–7.7)
Neutrophils Relative %: 86 %
Platelet Count: 869 K/uL — ABNORMAL HIGH (ref 150–400)
RBC: 1.8 MIL/uL — ABNORMAL LOW (ref 3.87–5.11)
RDW: 16.1 % — ABNORMAL HIGH (ref 11.5–15.5)
WBC Count: 8.1 K/uL (ref 4.0–10.5)
nRBC: 0 % (ref 0.0–0.2)

## 2023-12-18 MED ORDER — HYDROMORPHONE HCL 1 MG/ML IJ SOLN
1.0000 mg | Freq: Once | INTRAMUSCULAR | Status: AC
  Filled 2023-12-18: qty 1

## 2023-12-18 MED ORDER — EPOETIN ALFA-EPBX 20000 UNIT/ML IJ SOLN
20000.0000 [IU] | Freq: Once | INTRAMUSCULAR | Status: AC
  Administered 2023-12-18: 20000 [IU] via SUBCUTANEOUS
  Filled 2023-12-18: qty 1

## 2023-12-18 MED ORDER — HYDROMORPHONE HCL 1 MG/ML IJ SOLN
1.0000 mg | Freq: Once | INTRAMUSCULAR | Status: AC
  Administered 2023-12-18: 1 mg via INTRAMUSCULAR
  Filled 2023-12-18: qty 1

## 2023-12-18 NOTE — Patient Instructions (Signed)

## 2023-12-18 NOTE — Telephone Encounter (Signed)
 Patient has been scheduled for follow-up visit per 12/18/23 LOS.  Pt given an appt calendar with date and time.

## 2023-12-21 ENCOUNTER — Telehealth: Payer: Self-pay | Admitting: Cardiology

## 2023-12-21 ENCOUNTER — Other Ambulatory Visit (HOSPITAL_BASED_OUTPATIENT_CLINIC_OR_DEPARTMENT_OTHER): Payer: Self-pay

## 2023-12-21 MED ORDER — SACUBITRIL-VALSARTAN 49-51 MG PO TABS
1.0000 | ORAL_TABLET | Freq: Two times a day (BID) | ORAL | 0 refills | Status: DC
  Filled 2023-12-21: qty 180, 90d supply, fill #0

## 2023-12-21 NOTE — Telephone Encounter (Signed)
 1. Which medications need to be refilled? (please list name of each medication and dose if known)  sacubitril -valsartan  (ENTRESTO ) 49-51 MG  2. Which pharmacy/location (including street and city if local pharmacy) is medication to be sent to? [facility]   3. Do they need a 30 day or 90 day supply?  90 day supply

## 2023-12-21 NOTE — Telephone Encounter (Signed)
 Refills has been sent to the pharmacy.

## 2023-12-23 ENCOUNTER — Other Ambulatory Visit (HOSPITAL_BASED_OUTPATIENT_CLINIC_OR_DEPARTMENT_OTHER): Payer: Self-pay

## 2023-12-23 ENCOUNTER — Telehealth: Payer: Self-pay

## 2023-12-23 DIAGNOSIS — Z79631 Long term (current) use of antimetabolite agent: Secondary | ICD-10-CM | POA: Diagnosis not present

## 2023-12-23 DIAGNOSIS — I10 Essential (primary) hypertension: Secondary | ICD-10-CM | POA: Diagnosis not present

## 2023-12-23 DIAGNOSIS — Z556 Problems related to health literacy: Secondary | ICD-10-CM | POA: Diagnosis not present

## 2023-12-23 DIAGNOSIS — Z79891 Long term (current) use of opiate analgesic: Secondary | ICD-10-CM | POA: Diagnosis not present

## 2023-12-23 DIAGNOSIS — E039 Hypothyroidism, unspecified: Secondary | ICD-10-CM | POA: Diagnosis not present

## 2023-12-23 DIAGNOSIS — Z79899 Other long term (current) drug therapy: Secondary | ICD-10-CM | POA: Diagnosis not present

## 2023-12-23 DIAGNOSIS — D473 Essential (hemorrhagic) thrombocythemia: Secondary | ICD-10-CM | POA: Diagnosis not present

## 2023-12-23 DIAGNOSIS — D7589 Other specified diseases of blood and blood-forming organs: Secondary | ICD-10-CM | POA: Diagnosis not present

## 2023-12-23 DIAGNOSIS — Z7982 Long term (current) use of aspirin: Secondary | ICD-10-CM | POA: Diagnosis not present

## 2023-12-23 NOTE — Telephone Encounter (Signed)
 Returned call to [place], gave verbal order for nursing 2x a week.  Told her that we do not do the ABI measurements for uniboots.  She will check with patient's PCP.

## 2023-12-25 DIAGNOSIS — R600 Localized edema: Secondary | ICD-10-CM | POA: Diagnosis not present

## 2023-12-25 DIAGNOSIS — I10 Essential (primary) hypertension: Secondary | ICD-10-CM | POA: Diagnosis not present

## 2023-12-25 DIAGNOSIS — Z6822 Body mass index (BMI) 22.0-22.9, adult: Secondary | ICD-10-CM | POA: Diagnosis not present

## 2024-01-05 DIAGNOSIS — F419 Anxiety disorder, unspecified: Secondary | ICD-10-CM | POA: Diagnosis not present

## 2024-01-05 DIAGNOSIS — D509 Iron deficiency anemia, unspecified: Secondary | ICD-10-CM | POA: Diagnosis not present

## 2024-01-05 DIAGNOSIS — G8929 Other chronic pain: Secondary | ICD-10-CM | POA: Diagnosis not present

## 2024-01-05 DIAGNOSIS — I471 Supraventricular tachycardia, unspecified: Secondary | ICD-10-CM | POA: Diagnosis not present

## 2024-01-05 DIAGNOSIS — I110 Hypertensive heart disease with heart failure: Secondary | ICD-10-CM | POA: Diagnosis not present

## 2024-01-05 DIAGNOSIS — E440 Moderate protein-calorie malnutrition: Secondary | ICD-10-CM | POA: Diagnosis not present

## 2024-01-05 DIAGNOSIS — I5042 Chronic combined systolic (congestive) and diastolic (congestive) heart failure: Secondary | ICD-10-CM | POA: Diagnosis not present

## 2024-01-05 DIAGNOSIS — E039 Hypothyroidism, unspecified: Secondary | ICD-10-CM | POA: Diagnosis not present

## 2024-01-05 DIAGNOSIS — G4700 Insomnia, unspecified: Secondary | ICD-10-CM | POA: Diagnosis not present

## 2024-01-05 DIAGNOSIS — E8809 Other disorders of plasma-protein metabolism, not elsewhere classified: Secondary | ICD-10-CM | POA: Diagnosis not present

## 2024-01-05 DIAGNOSIS — R600 Localized edema: Secondary | ICD-10-CM | POA: Diagnosis not present

## 2024-01-05 DIAGNOSIS — I872 Venous insufficiency (chronic) (peripheral): Secondary | ICD-10-CM | POA: Diagnosis not present

## 2024-01-05 DIAGNOSIS — F331 Major depressive disorder, recurrent, moderate: Secondary | ICD-10-CM | POA: Diagnosis not present

## 2024-01-05 DIAGNOSIS — E785 Hyperlipidemia, unspecified: Secondary | ICD-10-CM | POA: Diagnosis not present

## 2024-01-05 DIAGNOSIS — Z7982 Long term (current) use of aspirin: Secondary | ICD-10-CM | POA: Diagnosis not present

## 2024-01-05 DIAGNOSIS — M47896 Other spondylosis, lumbar region: Secondary | ICD-10-CM | POA: Diagnosis not present

## 2024-01-05 DIAGNOSIS — D473 Essential (hemorrhagic) thrombocythemia: Secondary | ICD-10-CM | POA: Diagnosis not present

## 2024-01-05 DIAGNOSIS — K589 Irritable bowel syndrome without diarrhea: Secondary | ICD-10-CM | POA: Diagnosis not present

## 2024-01-08 DIAGNOSIS — G8929 Other chronic pain: Secondary | ICD-10-CM | POA: Diagnosis not present

## 2024-01-08 DIAGNOSIS — I110 Hypertensive heart disease with heart failure: Secondary | ICD-10-CM | POA: Diagnosis not present

## 2024-01-08 DIAGNOSIS — R600 Localized edema: Secondary | ICD-10-CM | POA: Diagnosis not present

## 2024-01-08 DIAGNOSIS — M47896 Other spondylosis, lumbar region: Secondary | ICD-10-CM | POA: Diagnosis not present

## 2024-01-08 DIAGNOSIS — I5042 Chronic combined systolic (congestive) and diastolic (congestive) heart failure: Secondary | ICD-10-CM | POA: Diagnosis not present

## 2024-01-08 DIAGNOSIS — I872 Venous insufficiency (chronic) (peripheral): Secondary | ICD-10-CM | POA: Diagnosis not present

## 2024-01-12 DIAGNOSIS — R600 Localized edema: Secondary | ICD-10-CM | POA: Diagnosis not present

## 2024-01-12 DIAGNOSIS — G8929 Other chronic pain: Secondary | ICD-10-CM | POA: Diagnosis not present

## 2024-01-12 DIAGNOSIS — M47896 Other spondylosis, lumbar region: Secondary | ICD-10-CM | POA: Diagnosis not present

## 2024-01-12 DIAGNOSIS — I5042 Chronic combined systolic (congestive) and diastolic (congestive) heart failure: Secondary | ICD-10-CM | POA: Diagnosis not present

## 2024-01-12 DIAGNOSIS — I110 Hypertensive heart disease with heart failure: Secondary | ICD-10-CM | POA: Diagnosis not present

## 2024-01-12 DIAGNOSIS — I872 Venous insufficiency (chronic) (peripheral): Secondary | ICD-10-CM | POA: Diagnosis not present

## 2024-01-14 DIAGNOSIS — G8929 Other chronic pain: Secondary | ICD-10-CM | POA: Diagnosis not present

## 2024-01-14 DIAGNOSIS — I872 Venous insufficiency (chronic) (peripheral): Secondary | ICD-10-CM | POA: Diagnosis not present

## 2024-01-14 DIAGNOSIS — M47896 Other spondylosis, lumbar region: Secondary | ICD-10-CM | POA: Diagnosis not present

## 2024-01-14 DIAGNOSIS — I110 Hypertensive heart disease with heart failure: Secondary | ICD-10-CM | POA: Diagnosis not present

## 2024-01-14 DIAGNOSIS — I5042 Chronic combined systolic (congestive) and diastolic (congestive) heart failure: Secondary | ICD-10-CM | POA: Diagnosis not present

## 2024-01-14 DIAGNOSIS — R600 Localized edema: Secondary | ICD-10-CM | POA: Diagnosis not present

## 2024-01-15 ENCOUNTER — Inpatient Hospital Stay: Attending: Oncology

## 2024-01-15 ENCOUNTER — Other Ambulatory Visit: Payer: Self-pay | Admitting: Hematology and Oncology

## 2024-01-15 ENCOUNTER — Inpatient Hospital Stay

## 2024-01-15 ENCOUNTER — Telehealth: Payer: Self-pay

## 2024-01-15 VITALS — BP 152/74 | HR 93 | Temp 98.4°F | Resp 18

## 2024-01-15 DIAGNOSIS — D649 Anemia, unspecified: Secondary | ICD-10-CM

## 2024-01-15 DIAGNOSIS — D473 Essential (hemorrhagic) thrombocythemia: Secondary | ICD-10-CM | POA: Insufficient documentation

## 2024-01-15 DIAGNOSIS — D75839 Thrombocytosis, unspecified: Secondary | ICD-10-CM | POA: Diagnosis not present

## 2024-01-15 LAB — CBC WITH DIFFERENTIAL (CANCER CENTER ONLY)
Abs Immature Granulocytes: 0.09 K/uL — ABNORMAL HIGH (ref 0.00–0.07)
Basophils Absolute: 0.1 K/uL (ref 0.0–0.1)
Basophils Relative: 1 %
Eosinophils Absolute: 0 K/uL (ref 0.0–0.5)
Eosinophils Relative: 0 %
HCT: 33.5 % — ABNORMAL LOW (ref 36.0–46.0)
Hemoglobin: 11.4 g/dL — ABNORMAL LOW (ref 12.0–15.0)
Immature Granulocytes: 1 %
Lymphocytes Relative: 6 %
Lymphs Abs: 0.5 K/uL — ABNORMAL LOW (ref 0.7–4.0)
MCH: 48.3 pg — ABNORMAL HIGH (ref 26.0–34.0)
MCHC: 34 g/dL (ref 30.0–36.0)
MCV: 141.9 fL — ABNORMAL HIGH (ref 80.0–100.0)
Monocytes Absolute: 0.7 K/uL (ref 0.1–1.0)
Monocytes Relative: 8 %
Neutro Abs: 7 K/uL (ref 1.7–7.7)
Neutrophils Relative %: 84 %
Platelet Count: 1359 K/uL (ref 150–400)
RBC: 2.36 MIL/uL — ABNORMAL LOW (ref 3.87–5.11)
RDW: 12.3 % (ref 11.5–15.5)
WBC Count: 8.4 K/uL (ref 4.0–10.5)
nRBC: 0 % (ref 0.0–0.2)

## 2024-01-15 MED ORDER — OXYCODONE HCL 5 MG PO TABS
5.0000 mg | ORAL_TABLET | Freq: Once | ORAL | Status: AC
  Administered 2024-01-15: 5 mg via ORAL
  Filled 2024-01-15: qty 1

## 2024-01-15 NOTE — Patient Instructions (Signed)
 ENCOURAGED PATIENT AND DAUGHTER TO HAVE HER MOM TO TAKE A STOOL SOFTENER WITH LAXATIVE FOR POSSIBLE CONSTIPATION FORM THE OXYCODONE . BOTH VERBALIZED UNDERSTANDING. Oxycodone ; Acetaminophen  Tablets What is this medication? ACETAMINOPHEN ; OXYCODONE  (a set a MEE noe fen; ox i KOE done) treats moderate pain. It is prescribed when other pain medications have not worked or cannot be tolerated. It works by blocking pain signals in the brain. This medication is a combination of acetaminophen  and an opioid. This medicine may be used for other purposes; ask your health care provider or pharmacist if you have questions. COMMON BRAND NAME(S): Endocet, Magnacet, Nalocet, Narvox, Percocet, Perloxx, Primalev, Primlev, Prolate, Roxicet, Xolox What should I tell my care team before I take this medication? They need to know if you have any of these conditions: Brain tumor Frequently drink alcohol Head injury Heart disease Kidney disease Liver disease Low adrenal gland function Lung or breathing disease, such as asthma or COPD Seizures Stomach or intestine problems Substance use disorder Taken an MAOI, such as Marplan, Nardil, or Parnate in the last 14 days An unusual or allergic reaction to acetaminophen , oxycodone , other medications, foods, dyes, or preservatives Pregnant or trying to get pregnant Breastfeeding How should I use this medication? Take this medicine by mouth with a full glass of water. Take it as directed on the label. You can take it with or without food. If it upsets your stomach, take it with food. Do not take it more often than directed. There may be unused or extra doses in the bottle after you finish your treatment. Talk to your care team if you have questions about your dose. A special MedGuide will be given to you by the pharmacist with each prescription and refill. Be sure to read this information carefully each time. Talk to your care team about the use of this medication in  children. Special care may be needed. People 65 years and older may have a stronger reaction and need a smaller dose. Overdosage: If you think you have taken too much of this medicine contact a poison control center or emergency room at once. NOTE: This medicine is only for you. Do not share this medicine with others. What if I miss a dose? If you miss a dose, take it as soon as you remember. Then, take your next dose 12 hours later. Do not take double or extra doses. What may interact with this medication? Alcohol Antihistamines for allergy, cough, and cold Antiviral medications for HIV or AIDS Atropine Certain antibiotics, such as clarithromycin, erythromycin, linezolid, rifampin Certain medications for anxiety or sleep Certain medications for bladder problems, such as oxybutynin, tolterodine Certain medications for depression, such as amitriptyline, fluoxetine, sertraline Certain medications for fungal infections, such as ketoconazole, itraconazole, voriconazole Certain medications for migraine headache, such as almotriptan, eletriptan, frovatriptan, naratriptan, rizatriptan, sumatriptan, zolmitriptan Certain medications for nausea or vomiting, such as dolasetron, ondansetron , palonosetron Certain medications for Parkinson disease, such as benztropine, trihexyphenidyl Certain medications for seizures, such as phenobarbital, phenytoin, primidone Certain medications for stomach problems, such as dicyclomine, hyoscyamine Certain medications for travel sickness, such as scopolamine  Diuretics General anesthetics, such as halothane, isoflurane, methoxyflurane, propofol  Ipratropium Local anesthetics, such as lidocaine, pramoxine, tetracaine  MAOIs, such as Carbex, Eldepryl, Marplan, Nardil, Parnate Medications that relax muscles for surgery Methylene blue Nilotinib Other medications with acetaminophen  Other opioid medications for pain or cough Phenothiazines, such as chlorpromazine,  mesoridazine, prochlorperazine, thioridazine This list may not describe all possible interactions. Give your health care provider a list of all  the medicines, herbs, non-prescription drugs, or dietary supplements you use. Also tell them if you smoke, drink alcohol, or use illegal drugs. Some items may interact with your medicine. What should I watch for while using this medication? Tell your care team if your pain does not go away, if it gets worse, or if you have new or a different type of pain. You may develop tolerance to this medication. Tolerance means that you will need a higher dose of the medication for pain relief. Tolerance is normal and is expected if you take this medication for a long time. Taking this medication with other substances that cause drowsiness, such as alcohol, benzodiazepines, or other opioids can cause serious side effects. Give your care team a list of all medications you use. They will tell you how much medication to take. Do not take more medication than directed. Call emergency services if you have problems breathing or staying awake. Children may be at higher risk for side effects. Stop giving this medication and call emergency services right away if your child has slow or noisy breathing, has confusion, is unusually sleepy, or not able to wake up. Long term use of this medication may cause your brain and body to depend on it. This can happen even when used as directed by your care team. You and your care team will work together to determine how long you will need to take this medication. If your care team wants you to stop this medication, the dose will be slowly lowered over time to reduce the risk of side effects. Naloxone is an emergency medication used for an opioid overdose. An overdose can happen if you take too much of an opioid. It can also happen if an opioid is taken with some other medications or substances such as alcohol. Know the symptoms of an overdose, such as  trouble breathing, unusually tired or sleepy, or not being able to respond or wake up. Make sure to tell caregivers and close contacts where your naloxone is stored. Make sure they know how to use it. After naloxone is given, the person giving it must call emergency services. Naloxone is a temporary treatment. Repeat doses may be needed. This medication may affect your coordination, reaction time, or judgment. Do not drive or operate machinery until you know how this medication affects you. Sit up or stand slowly to reduce the risk of dizzy or fainting spells. Drinking alcohol with this medication can increase the risk of these side effects. Do not take other medications that contain acetaminophen  with this medication. Many non-prescription medications contain acetaminophen . Always read labels carefully. If you have questions, ask your care team. If you take too much acetaminophen , get medical help right away. Too much acetaminophen  can be very dangerous and cause liver damage. Even if you do not have symptoms, it is important to get help right away. This medication will cause constipation. If you do not have a bowel movement for 3 days, call your care team. Your mouth may get dry. Chewing sugarless gum or sucking hard candy and drinking plenty of water may help. Contact your care team if the problem does not go away or is severe. Talk to your care team if you may be pregnant. Prolonged use of this medication during pregnancy can cause temporary withdrawal in a newborn. Talk to your care team before breastfeeding. Changes to your treatment plan may be needed. If you breastfeed while taking this medication, seek medical care right away if you notice  the child has slow or noisy breathing, is unusually sleepy or not able to wake up, or is limp. Long-term use of this medication may cause infertility. Talk to your care team if you are concerned about your fertility. What side effects may I notice from receiving  this medication? Side effects that you should report to your care team as soon as possible: Allergic reactions--skin rash, itching, hives, swelling of the face, lips, tongue, or throat CNS depression--slow or shallow breathing, shortness of breath, feeling faint, dizziness, confusion, trouble staying awake Liver injury--right upper belly pain, loss of appetite, nausea, light-colored stool, dark yellow or brown urine, yellowing skin or eyes, unusual weakness or fatigue Low adrenal gland function--nausea, vomiting, loss of appetite, unusual weakness or fatigue, dizziness Low blood pressure--dizziness, feeling faint or lightheaded, blurry vision Redness, blistering, peeling, or loosening of the skin, including inside the mouth Side effects that usually do not require medical attention (report to your care team if they continue or are bothersome): Constipation Dizziness Drowsiness Dry mouth Headache Nausea Trouble sleeping Upset stomach Vomiting This list may not describe all possible side effects. Call your doctor for medical advice about side effects. You may report side effects to FDA at 1-800-FDA-1088. Where should I keep my medication? Keep this medication out of reach of children and pets. Store it out of sight in a safe place. Do not share it with others. Misuse of this medication is dangerous and against the law. Store at room temperature between 20 and 25 degrees C (68 and 77 degrees F). Protect from light. Get rid of any unused medication after the expiration date. This medication may cause harm and death if it is taken by other adults, children, or pets. It is important to get rid of the medication as soon as you no longer need it or it is expired. To get rid of this medication: Take the medication to a take-back program. Check with your pharmacy or law enforcement to find a location. Follow the steps given to you by your pharmacy. You may be given a pre-paid mail-back envelope or  disposal product to safely get rid of your medication. If other options are not available, flush the medication down the toilet. NOTE: This sheet is a summary. It may not cover all possible information. If you have questions about this medicine, talk to your doctor, pharmacist, or health care provider.  2025 Elsevier/Gold Standard (2023-05-06 00:00:00)

## 2024-01-15 NOTE — Progress Notes (Signed)
 CRITICAL VALUE STICKER  CRITICAL VALUE:  Plt [phone]  RECEIVER (on-site recipient of call):  Woodie Kapur RN  DATE & TIME NOTIFIED:   01/15/2024 @ 1037  MESSENGER (representative from lab):  MD NOTIFIED: Andrez Foy PA  TIME OF NOTIFICATION:  1420  RESPONSE:  Would you confirm she is taking hydrea  twice daily or does she miss doses. Let her know her platelets are higher and if taking bid and no missed doses have her increase the hydrea  to three times daily.   She has not missed any doses per daughter.  Instructed to increase to TID until next lab appointment.

## 2024-01-15 NOTE — Progress Notes (Signed)
 HOLDING RETACRIT  TODAY FOR HEMOGLOBIN AT 11.4. OXYCODONE  GIVEN TODAY FOR PAIN IN HER FEET.

## 2024-01-15 NOTE — Telephone Encounter (Signed)
 Andrez Foy PA response:  Would you confirm she is taking hydrea  twice daily or does she miss doses. Let her know her platelets are higher and if taking bid and no missed doses have her increase the hydrea  to three times daily.   She has not missed any doses per daughter.  Instructed to increase to TID until next lab appointment.

## 2024-01-19 DIAGNOSIS — R600 Localized edema: Secondary | ICD-10-CM | POA: Diagnosis not present

## 2024-01-19 DIAGNOSIS — G8929 Other chronic pain: Secondary | ICD-10-CM | POA: Diagnosis not present

## 2024-01-19 DIAGNOSIS — I872 Venous insufficiency (chronic) (peripheral): Secondary | ICD-10-CM | POA: Diagnosis not present

## 2024-01-19 DIAGNOSIS — M47896 Other spondylosis, lumbar region: Secondary | ICD-10-CM | POA: Diagnosis not present

## 2024-01-19 DIAGNOSIS — I5042 Chronic combined systolic (congestive) and diastolic (congestive) heart failure: Secondary | ICD-10-CM | POA: Diagnosis not present

## 2024-01-19 DIAGNOSIS — I110 Hypertensive heart disease with heart failure: Secondary | ICD-10-CM | POA: Diagnosis not present

## 2024-01-22 DIAGNOSIS — M47896 Other spondylosis, lumbar region: Secondary | ICD-10-CM | POA: Diagnosis not present

## 2024-01-22 DIAGNOSIS — G8929 Other chronic pain: Secondary | ICD-10-CM | POA: Diagnosis not present

## 2024-01-22 DIAGNOSIS — I5042 Chronic combined systolic (congestive) and diastolic (congestive) heart failure: Secondary | ICD-10-CM | POA: Diagnosis not present

## 2024-01-22 DIAGNOSIS — I872 Venous insufficiency (chronic) (peripheral): Secondary | ICD-10-CM | POA: Diagnosis not present

## 2024-01-22 DIAGNOSIS — I110 Hypertensive heart disease with heart failure: Secondary | ICD-10-CM | POA: Diagnosis not present

## 2024-01-22 DIAGNOSIS — R600 Localized edema: Secondary | ICD-10-CM | POA: Diagnosis not present

## 2024-01-26 DIAGNOSIS — I110 Hypertensive heart disease with heart failure: Secondary | ICD-10-CM | POA: Diagnosis not present

## 2024-01-26 DIAGNOSIS — M47896 Other spondylosis, lumbar region: Secondary | ICD-10-CM | POA: Diagnosis not present

## 2024-01-26 DIAGNOSIS — G8929 Other chronic pain: Secondary | ICD-10-CM | POA: Diagnosis not present

## 2024-01-26 DIAGNOSIS — I5042 Chronic combined systolic (congestive) and diastolic (congestive) heart failure: Secondary | ICD-10-CM | POA: Diagnosis not present

## 2024-01-26 DIAGNOSIS — I872 Venous insufficiency (chronic) (peripheral): Secondary | ICD-10-CM | POA: Diagnosis not present

## 2024-01-26 DIAGNOSIS — R600 Localized edema: Secondary | ICD-10-CM | POA: Diagnosis not present

## 2024-01-29 DIAGNOSIS — I110 Hypertensive heart disease with heart failure: Secondary | ICD-10-CM | POA: Diagnosis not present

## 2024-01-29 DIAGNOSIS — M47896 Other spondylosis, lumbar region: Secondary | ICD-10-CM | POA: Diagnosis not present

## 2024-01-29 DIAGNOSIS — I872 Venous insufficiency (chronic) (peripheral): Secondary | ICD-10-CM | POA: Diagnosis not present

## 2024-01-29 DIAGNOSIS — G8929 Other chronic pain: Secondary | ICD-10-CM | POA: Diagnosis not present

## 2024-01-29 DIAGNOSIS — R600 Localized edema: Secondary | ICD-10-CM | POA: Diagnosis not present

## 2024-01-29 DIAGNOSIS — I5042 Chronic combined systolic (congestive) and diastolic (congestive) heart failure: Secondary | ICD-10-CM | POA: Diagnosis not present

## 2024-02-02 DIAGNOSIS — I872 Venous insufficiency (chronic) (peripheral): Secondary | ICD-10-CM | POA: Diagnosis not present

## 2024-02-02 DIAGNOSIS — R600 Localized edema: Secondary | ICD-10-CM | POA: Diagnosis not present

## 2024-02-02 DIAGNOSIS — M47896 Other spondylosis, lumbar region: Secondary | ICD-10-CM | POA: Diagnosis not present

## 2024-02-02 DIAGNOSIS — I110 Hypertensive heart disease with heart failure: Secondary | ICD-10-CM | POA: Diagnosis not present

## 2024-02-02 DIAGNOSIS — G8929 Other chronic pain: Secondary | ICD-10-CM | POA: Diagnosis not present

## 2024-02-02 DIAGNOSIS — I5042 Chronic combined systolic (congestive) and diastolic (congestive) heart failure: Secondary | ICD-10-CM | POA: Diagnosis not present

## 2024-02-04 ENCOUNTER — Other Ambulatory Visit: Payer: Self-pay

## 2024-02-04 ENCOUNTER — Encounter: Payer: Self-pay | Admitting: Cardiology

## 2024-02-04 ENCOUNTER — Ambulatory Visit: Attending: Cardiology | Admitting: Cardiology

## 2024-02-04 VITALS — BP 170/80 | HR 54 | Ht 60.0 in | Wt 117.0 lb

## 2024-02-04 DIAGNOSIS — E039 Hypothyroidism, unspecified: Secondary | ICD-10-CM | POA: Diagnosis not present

## 2024-02-04 DIAGNOSIS — I872 Venous insufficiency (chronic) (peripheral): Secondary | ICD-10-CM | POA: Diagnosis not present

## 2024-02-04 DIAGNOSIS — I110 Hypertensive heart disease with heart failure: Secondary | ICD-10-CM | POA: Diagnosis not present

## 2024-02-04 DIAGNOSIS — R0609 Other forms of dyspnea: Secondary | ICD-10-CM | POA: Diagnosis not present

## 2024-02-04 DIAGNOSIS — G4700 Insomnia, unspecified: Secondary | ICD-10-CM | POA: Diagnosis not present

## 2024-02-04 DIAGNOSIS — K589 Irritable bowel syndrome without diarrhea: Secondary | ICD-10-CM | POA: Diagnosis not present

## 2024-02-04 DIAGNOSIS — I471 Supraventricular tachycardia, unspecified: Secondary | ICD-10-CM | POA: Diagnosis not present

## 2024-02-04 DIAGNOSIS — I5032 Chronic diastolic (congestive) heart failure: Secondary | ICD-10-CM | POA: Diagnosis not present

## 2024-02-04 DIAGNOSIS — M47896 Other spondylosis, lumbar region: Secondary | ICD-10-CM | POA: Diagnosis not present

## 2024-02-04 DIAGNOSIS — E785 Hyperlipidemia, unspecified: Secondary | ICD-10-CM | POA: Diagnosis not present

## 2024-02-04 DIAGNOSIS — I10 Essential (primary) hypertension: Secondary | ICD-10-CM | POA: Insufficient documentation

## 2024-02-04 DIAGNOSIS — R600 Localized edema: Secondary | ICD-10-CM | POA: Diagnosis not present

## 2024-02-04 DIAGNOSIS — D509 Iron deficiency anemia, unspecified: Secondary | ICD-10-CM | POA: Diagnosis not present

## 2024-02-04 DIAGNOSIS — R5383 Other fatigue: Secondary | ICD-10-CM | POA: Diagnosis not present

## 2024-02-04 DIAGNOSIS — F419 Anxiety disorder, unspecified: Secondary | ICD-10-CM | POA: Diagnosis not present

## 2024-02-04 DIAGNOSIS — G8929 Other chronic pain: Secondary | ICD-10-CM | POA: Diagnosis not present

## 2024-02-04 DIAGNOSIS — E440 Moderate protein-calorie malnutrition: Secondary | ICD-10-CM | POA: Diagnosis not present

## 2024-02-04 DIAGNOSIS — E8809 Other disorders of plasma-protein metabolism, not elsewhere classified: Secondary | ICD-10-CM | POA: Diagnosis not present

## 2024-02-04 DIAGNOSIS — I5042 Chronic combined systolic (congestive) and diastolic (congestive) heart failure: Secondary | ICD-10-CM | POA: Diagnosis not present

## 2024-02-04 DIAGNOSIS — F331 Major depressive disorder, recurrent, moderate: Secondary | ICD-10-CM | POA: Diagnosis not present

## 2024-02-04 DIAGNOSIS — D473 Essential (hemorrhagic) thrombocythemia: Secondary | ICD-10-CM | POA: Diagnosis not present

## 2024-02-04 DIAGNOSIS — Z7982 Long term (current) use of aspirin: Secondary | ICD-10-CM | POA: Diagnosis not present

## 2024-02-04 NOTE — Patient Instructions (Addendum)
 Medication Instructions:  Your physician recommends that you continue on your current medications as directed. Please refer to the Current Medication list given to you today.  *If you need a refill on your cardiac medications before your next appointment, please call your pharmacy*   Lab Work: CMP, TSH, ProBNP today If you have labs (blood work) drawn today and your tests are completely normal, you will receive your results only by: MyChart Message (if you have MyChart) OR A paper copy in the mail If you have any lab test that is abnormal or we need to change your treatment, we will call you to review the results.   Testing/Procedures: Your physician has requested that you have an echocardiogram. Echocardiography is a painless test that uses sound waves to create images of your heart. It provides your doctor with information about the size and shape of your heart and how well your heart's chambers and valves are working. This procedure takes approximately one hour. There are no restrictions for this procedure. Please do NOT wear cologne, perfume, aftershave, or lotions (deodorant is allowed). Please arrive 15 minutes prior to your appointment time.  Please note: We ask at that you not bring children with you during ultrasound (echo/ vascular) testing. Due to room size and safety concerns, children are not allowed in the ultrasound rooms during exams. Our front office staff cannot provide observation of children in our lobby area while testing is being conducted. An adult accompanying a patient to their appointment will only be allowed in the ultrasound room at the discretion of the ultrasound technician under special circumstances. We apologize for any inconvenience.    Follow-Up: At [facility], you and your health needs are our priority.  As part of our continuing mission to provide you with exceptional heart care, we have created designated Provider Care Teams.  These Care Teams include  your primary Cardiologist (physician) and Advanced Practice Providers (APPs -  Physician Assistants and Nurse Practitioners) who all work together to provide you with the care you need, when you need it.  We recommend signing up for the patient portal called MyChart.  Sign up information is provided on this After Visit Summary.  MyChart is used to connect with patients for Virtual Visits (Telemedicine).  Patients are able to view lab/test results, encounter notes, upcoming appointments, etc.  Non-urgent messages can be sent to your provider as well.   To learn more about what you can do with MyChart, go to [email or web address].    Your next appointment:   3 month(s)  The format for your next appointment:   In Person  Provider:   Lamar Fitch, MD    Other Instructions NA

## 2024-02-04 NOTE — Progress Notes (Unsigned)
 Cardiology Office Note:    Date:  02/04/2024   ID:  Jordan Higgins, DOB 05-Apr-1946, MRN [id number]  PCP:  Erick Greig LABOR, NP  Cardiologist:  Lamar Fitch, MD    Referring MD: Erick Greig LABOR, NP   Chief Complaint  Patient presents with   Follow-up  Swollen legs  History of Present Illness:    Jordan Higgins is a 78 y.o. female past medical history significant for essential thrombocythemia, GERD, hypertension, history of TIA.  Also congestive heart failure diastolic in nature.  Comes today to months for follow-up she does have significantly swollen legs she was told this is related to thrombocythemia.,  She does have pain in multiple joints as well.  Complain of being weak tired exhausted.  Denies having any chest pain tightness squeezing pressure mid chest  Past Medical History:  Diagnosis Date   Anxiety    Chronic diastolic heart failure (HCC) 01/18/2016   Chronic lower back pain    Essential thrombocythemia (HCC)    GERD (gastroesophageal reflux disease)    Headache(784.0) 01/16/2012   all day; think its related to TIA   High cholesterol    Hypertension    Hyponatremia 01/18/2016   Hypothyroidism    PONV (postoperative nausea and vomiting)    violently; only thing that works for her is scopolamine  patch    Stroke (HCC) 12/20/2020   TIA (transient ischemic attack) 01/16/2012    Past Surgical History:  Procedure Laterality Date   BACK SURGERY     BLADDER SUSPENSION  1990's   w/rectal suspensiion   BUBBLE STUDY  01/07/2021   Procedure: BUBBLE STUDY;  Surgeon: Loni Soyla LABOR, MD;  Location: [facility] ENDOSCOPY;  Service: Cardiology;;   CHOLECYSTECTOMY  ~ 2010   INCISION AND DRAINAGE OF WOUND  11/15/2011; early 12/2011   abscess; S/P back OR; staph infection in wound   LUMBAR DISC SURGERY  11/06/2011   shaved down 2 bulging discs   PERIPHERALLY INSERTED CENTRAL CATHETER INSERTION  ~ 12/07/2011   right upper arm   TEE WITHOUT CARDIOVERSION N/A 01/07/2021    Procedure: TRANSESOPHAGEAL ECHOCARDIOGRAM (TEE);  Surgeon: Loni Soyla LABOR, MD;  Location: [facility] ENDOSCOPY;  Service: Cardiology;  Laterality: N/A;    Current Medications: Current Meds  Medication Sig   acetaminophen  (TYLENOL ) 500 MG tablet Take 500 mg by mouth every 6 (six) hours as needed.   aspirin  EC 81 MG tablet Take 81 mg by mouth in the morning. Swallow whole.   busPIRone (BUSPAR) 5 MG tablet Take 5 mg by mouth 3 (three) times daily as needed (nerves).   diltiazem  (CARDIZEM  CD) 180 MG 24 hr capsule Take 1 capsule (180 mg total) by mouth daily.   hydroxyurea  (HYDREA ) 500 MG capsule Take 1 capsule (500 mg total) by mouth as directed. May take with food to minimize GI side effects Hydrea  500mg  po TID Mon Wed Fri 500mg  po BID Tues Thurs Sat and Sun   levothyroxine  (SYNTHROID , LEVOTHROID) 88 MCG tablet Take 88 mcg by mouth daily before breakfast. Take 1 tablet (88 mcg) by mouth in the morning every day EXCEPT on Sunday.   methocarbamol (ROBAXIN) 500 MG tablet Take 500 mg by mouth every 4 (four) hours as needed.   Polyethyl Glycol-Propyl Glycol (SYSTANE) 0.4-0.3 % SOLN Place 1-2 drops into both eyes 3 (three) times daily as needed (dry/irritated eyes.).   sacubitril -valsartan  (ENTRESTO ) 49-51 MG Take 1 tablet by mouth 2 (two) times daily.   traMADol -acetaminophen  (ULTRACET) 37.5-325 MG tablet Take  1 tablet by mouth 2 (two) times daily as needed for moderate pain or severe pain (chronic pain.).   zolpidem (AMBIEN) 10 MG tablet Take 10 mg by mouth at bedtime as needed for sleep.     Allergies:   Atorvastatin, Mirtazapine, and Trazodone   Social History   Socioeconomic History   Marital status: Married    Spouse name: Not on file   Number of children: Not on file   Years of education: Not on file   Highest education level: Not on file  Occupational History   Not on file  Tobacco Use   Smoking status: Never   Smokeless tobacco: Never  Substance and Sexual Activity   Alcohol use: No     Alcohol/week: 0.0 standard drinks of alcohol   Drug use: No   Sexual activity: Not on file  Other Topics Concern   Not on file  Social History Narrative   Not on file   Social Drivers of Health   Financial Resource Strain: Not on file  Food Insecurity: Not on file  Transportation Needs: Not on file  Physical Activity: Not on file  Stress: Not on file  Social Connections: Not on file     Family History: The patient's family history includes Heart attack in her brother. ROS:   Please see the history of present illness.    All 14 point review of systems negative except as described per history of present illness  EKGs/Labs/Other Studies Reviewed:         Recent Labs: 01/15/2024: Hemoglobin 11.4; Platelet Count 1,359  Recent Lipid Panel    Component Value Date/Time   CHOL 138 01/17/2012 0403   TRIG 135 01/17/2012 0403   HDL 44 01/17/2012 0403   CHOLHDL 3.1 01/17/2012 0403   VLDL 27 01/17/2012 0403   LDLCALC 67 01/17/2012 0403    Physical Exam:    VS:  BP (!) 170/80   Pulse (!) 54   Ht 5' (1.524 m)   Wt 117 lb (53.1 kg)   SpO2 99%   BMI 22.85 kg/m     Wt Readings from Last 3 Encounters:  02/04/24 117 lb (53.1 kg)  12/18/23 124 lb 8 oz (56.5 kg)  12/02/23 121 lb 14.4 oz (55.3 kg)     GEN:  Well nourished, well developed in no acute distress HEENT: Normal NECK: No JVD; No carotid bruits LYMPHATICS: No lymphadenopathy CARDIAC: RRR, no murmurs, no rubs, no gallops RESPIRATORY:  Clear to auscultation without rales, wheezing or rhonchi  ABDOMEN: Soft, non-tender, non-distended MUSCULOSKELETAL:  No edema; No deformity  SKIN: Warm and dry LOWER EXTREMITIES: 2+ swelling, her legs are wrapped in bandages NEUROLOGIC:  Alert and oriented x 3 PSYCHIATRIC:  Normal affect   ASSESSMENT:    1. Chronic diastolic heart failure (HCC)   2. Hypertensive heart disease with heart failure (HCC)   3. Essential thrombocytosis (HCC)   4. Dyslipidemia    PLAN:    In  order of problems listed above:  Chronic diastolic congestive heart failure.  She does have significant swelling of lower extremities will schedule to have proBNP complete metabolic panel and echocardiogram 21 CT question if this is related to her heart. Essential hypertension blood pressure always elevated in the office but at home she does have blood pressure of 118/60 therefore we will continue monitoring. Essential thrombocytosis, that being follow-up by primary care physician and oncology team. Dyslipidemia I did review K PN which show me LDL at 102 HDL 65 we  will continue present management for now   Medication Adjustments/Labs and Tests Ordered: Current medicines are reviewed at length with the patient today.  Concerns regarding medicines are outlined above.  No orders of the defined types were placed in this encounter.  Medication changes: No orders of the defined types were placed in this encounter.   Signed, Lamar DOROTHA Fitch, MD, [facility] 02/04/2024 10:36 AM    [facility]

## 2024-02-05 DIAGNOSIS — M47896 Other spondylosis, lumbar region: Secondary | ICD-10-CM | POA: Diagnosis not present

## 2024-02-05 DIAGNOSIS — R600 Localized edema: Secondary | ICD-10-CM | POA: Diagnosis not present

## 2024-02-05 DIAGNOSIS — G8929 Other chronic pain: Secondary | ICD-10-CM | POA: Diagnosis not present

## 2024-02-05 DIAGNOSIS — I5042 Chronic combined systolic (congestive) and diastolic (congestive) heart failure: Secondary | ICD-10-CM | POA: Diagnosis not present

## 2024-02-05 DIAGNOSIS — I872 Venous insufficiency (chronic) (peripheral): Secondary | ICD-10-CM | POA: Diagnosis not present

## 2024-02-05 DIAGNOSIS — I110 Hypertensive heart disease with heart failure: Secondary | ICD-10-CM | POA: Diagnosis not present

## 2024-02-05 LAB — COMPREHENSIVE METABOLIC PANEL WITH GFR
ALT: 9 IU/L (ref 0–32)
AST: 31 IU/L (ref 0–40)
Albumin: 3.7 g/dL — ABNORMAL LOW (ref 3.8–4.8)
Alkaline Phosphatase: 62 IU/L (ref 44–121)
BUN/Creatinine Ratio: 20 (ref 12–28)
BUN: 16 mg/dL (ref 8–27)
Bilirubin Total: 0.5 mg/dL (ref 0.0–1.2)
CO2: 20 mmol/L (ref 20–29)
Calcium: 9 mg/dL (ref 8.7–10.3)
Chloride: 100 mmol/L (ref 96–106)
Creatinine, Ser: 0.81 mg/dL (ref 0.57–1.00)
Globulin, Total: 1.9 g/dL (ref 1.5–4.5)
Glucose: 112 mg/dL — ABNORMAL HIGH (ref 70–99)
Potassium: 4.4 mmol/L (ref 3.5–5.2)
Sodium: 137 mmol/L (ref 134–144)
Total Protein: 5.6 g/dL — ABNORMAL LOW (ref 6.0–8.5)
eGFR: 73 mL/min/1.73 (ref >59)

## 2024-02-05 LAB — PRO B NATRIURETIC PEPTIDE: NT-Pro BNP: 356 pg/mL (ref 0–738)

## 2024-02-05 LAB — TSH: TSH: 2.41 u[IU]/mL (ref 0.450–4.500)

## 2024-02-09 ENCOUNTER — Ambulatory Visit: Payer: Self-pay | Admitting: Cardiology

## 2024-02-09 DIAGNOSIS — G8929 Other chronic pain: Secondary | ICD-10-CM | POA: Diagnosis not present

## 2024-02-09 DIAGNOSIS — R600 Localized edema: Secondary | ICD-10-CM | POA: Diagnosis not present

## 2024-02-09 DIAGNOSIS — I872 Venous insufficiency (chronic) (peripheral): Secondary | ICD-10-CM | POA: Diagnosis not present

## 2024-02-09 DIAGNOSIS — M47896 Other spondylosis, lumbar region: Secondary | ICD-10-CM | POA: Diagnosis not present

## 2024-02-09 DIAGNOSIS — I110 Hypertensive heart disease with heart failure: Secondary | ICD-10-CM | POA: Diagnosis not present

## 2024-02-09 DIAGNOSIS — I5042 Chronic combined systolic (congestive) and diastolic (congestive) heart failure: Secondary | ICD-10-CM | POA: Diagnosis not present

## 2024-02-10 MED ORDER — FUROSEMIDE 20 MG PO TABS: 20.0000 mg | ORAL_TABLET | Freq: Every day | ORAL | 3 refills | Status: DC | PRN

## 2024-02-10 NOTE — Telephone Encounter (Signed)
-----   Message from Lamar Fitch sent at 02/09/2024  8:35 AM EDT ----- Lab work test show no evidence of congestive heart failure.  If swelling of lower extremities is still a problem please start 20 mg of Lasix  daily as needed ----- Message ----- From: Interface, [facility] Lab Results In Sent: 02/05/2024   5:39 AM EDT To: Lamar JINNY Fitch, MD

## 2024-02-11 ENCOUNTER — Inpatient Hospital Stay: Admitting: Oncology

## 2024-02-11 ENCOUNTER — Inpatient Hospital Stay

## 2024-02-11 NOTE — Progress Notes (Signed)
 [facility] at [facility] [address] [place],  [place]  [address] [phone]  Clinic Day:   02/12/2024  Referring physician: Erick Greig LABOR, NP  HISTORY OF PRESENT ILLNESS:  The patient is a 78 y.o. female with essential thrombocythemia.  He comes in today to discuss her peripheral counts.  Of note, she remains on hydroxyurea  500 mg twice daily.  This patient also receives monthly Retacrit  injections as her hemoglobin has been persistently below 10.  Since her last visit, the patient has been doing somewhat better.  Despite her elevated platelets, she denies having any clotting complications.  PHYSICAL EXAM:  Blood pressure (!) 143/81, pulse 97, temperature 97.6 F (36.4 C), temperature source Oral, resp. rate 14, height 5' (1.524 m), weight 114 lb (51.7 kg), SpO2 100%. Wt Readings from Last 3 Encounters:  02/12/24 114 lb (51.7 kg)  02/04/24 117 lb (53.1 kg)  12/18/23 124 lb 8 oz (56.5 kg)   Body mass index is 22.26 kg/m. Performance status (ECOG): 2 Physical Exam Constitutional:      Appearance: Normal appearance. She is not ill-appearing.     Comments: She is ambulating with a walker  HENT:     Mouth/Throat:     Mouth: Mucous membranes are moist.     Pharynx: Oropharynx is clear. No oropharyngeal exudate or posterior oropharyngeal erythema.  Cardiovascular:     Rate and Rhythm: Normal rate and regular rhythm.     Heart sounds: No murmur heard.    No friction rub. No gallop.  Pulmonary:     Effort: Pulmonary effort is normal. No respiratory distress.     Breath sounds: Normal breath sounds. No wheezing, rhonchi or rales.  Abdominal:     General: Bowel sounds are normal. There is no distension.     Palpations: Abdomen is soft. There is no mass.     Tenderness: There is no abdominal tenderness.  Musculoskeletal:        General: No swelling.     Right lower leg: 1+ Edema present.     Left lower leg: 1+ Edema present.  Lymphadenopathy:     Cervical: No  cervical adenopathy.     Upper Body:     Right upper body: No supraclavicular or axillary adenopathy.     Left upper body: No supraclavicular or axillary adenopathy.     Lower Body: No right inguinal adenopathy. No left inguinal adenopathy.  Skin:    General: Skin is warm.     Coloration: Skin is not jaundiced.     Findings: No lesion or rash.  Neurological:     General: No focal deficit present.     Mental Status: She is alert and oriented to person, place, and time. Mental status is at baseline.  Psychiatric:        Mood and Affect: Mood normal.        Behavior: Behavior normal.        Thought Content: Thought content normal.    LABS:      Latest Ref Rng & Units 02/12/2024   10:51 AM 01/15/2024   10:21 AM 12/18/2023    9:49 AM  CBC  WBC 4.0 - 10.5 K/uL 5.2  8.4  8.1   Hemoglobin 12.0 - 15.0 g/dL 9.4  88.5  8.9   Hematocrit 36.0 - 46.0 % 27.4  33.5  26.7   Platelets 150 - 400 K/uL 627  1,359  869     Latest Reference Range & Units 11/27/23  10:44 12/02/23 08:55 12/18/23 09:49  MCV 80.0 - 100.0 fL 143.9 (H) 144.3 (H) 148.3 (H)  (H): Data is abnormally high  Latest Reference Range & Units 11/27/23 10:44  Iron 28 - 170 ug/dL 68  UIBC ug/dL 828  TIBC 749 - 549 ug/dL 760 (L)  Saturation Ratios 10.4 - 31.8 % 28  Ferritin 11 - 307 ng/mL 139  Folate >5.9 ng/mL 8.1  Vitamin B12 180 - 914 pg/mL 422  (L): Data is abnormally low  PATHOLOGY:  Her bone marrow biopsy revealed the following: BONE MARROW, ASPIRATE, CLOT, CORE:  - Cellular bone marrow with megakaryocytic hyperplasia and otherwise  orderly myeloid and erythroid maturation   PERIPHERAL BLOOD:  - Thrombocytosis   BONE MARROW ASPIRATE: The bone marrow aspirate smear slides contain  several cellular bone marrow spicules which are adequate for  interpretation.  Erythroid precursors:  The erythroid series is present in adequate  number with orderly maturation and unremarkable morphology.  Granulocytic precursors: The  myeloid series is present in adequate  number with orderly maturation and unremarkable morphology.  Megakaryocytes: The megakaryocytes are present in increased number some  with hyper lobulation as well as a subset with hyper lobulation.  Lymphocytes/plasma cells: Lymphocytes and plasma cells are not  increased.   CLOT AND BIOPSY: The biopsy consists of a very scant core of periosteum and cortical bone with significant aspiration artifact and is overall suboptimal to inadequate for evaluation.  There is very limited hematopoietic marrow for evaluation.  It is noted that there are increased megakaryocytes with clustering.  The clot section has a limited number of hematopoietic spicules ranging in cellularity from 5% to focally 50% with an overall cellularity interpreted as 20% on limited material and possible subcortical location given the biopsy.  There is significant megakaryocytic clustering present with abnormally lobulated megakaryocytes.  There is otherwise orderly myeloid and erythroid maturation.   IMMUNOHISTOCHEMICAL STAINS: Immunohistochemical stains were performed in both biopsy and clot section.  There is no increase in CD34/CD117 blast/immature precursors.  E-cadherin and myeloperoxidase highlight the erythroid and myeloid compartment respectively.  SPECIAL STAINS: Trichrome and reticulin stains were not performed to evaluate for the presence of fibrosis due to the limited material on the biopsy.   IRON STAIN: Iron stains are performed on a bone marrow aspirate or touch  imprint smear and section of clot. The controls stained appropriately.        Storage Iron: Adequate histiocytic iron stores       Ring Sideroblasts: Not identified     ASSESSMENT & PLAN:  Assessment/Plan:  An 78 y.o. female with essential thrombocythemia.  When evaluating her labs today, there has been a significant improvement in her platelets.  Ideally, the goal is to get her platelets less than  400-600.  Although she is not there currently, she will remain on hydroxyurea  500 mg twice daily with the hopes that her platelets will continue to fall to desired levels.  As her hemoglobin remains less than 10, she will receive her Retacrit  injection today.  She will continue to receive monthly Retacrit  injections whenever her hemoglobin is below 10.  Her CBC will continue to be followed once every 4 weeks to assess the status of her peripheral counts.  I will see her back in 12 weeks for repeat clinical assessment.  The patient understands all the plans discussed today and is in agreement with them.    Rashaad Hallstrom DELENA Kerns, MD

## 2024-02-12 ENCOUNTER — Inpatient Hospital Stay (HOSPITAL_BASED_OUTPATIENT_CLINIC_OR_DEPARTMENT_OTHER): Admitting: Oncology

## 2024-02-12 ENCOUNTER — Inpatient Hospital Stay

## 2024-02-12 ENCOUNTER — Inpatient Hospital Stay: Attending: Oncology

## 2024-02-12 ENCOUNTER — Inpatient Hospital Stay: Admitting: Oncology

## 2024-02-12 ENCOUNTER — Other Ambulatory Visit: Payer: Self-pay | Admitting: Oncology

## 2024-02-12 VITALS — BP 143/81 | HR 97 | Temp 97.6°F | Resp 14 | Ht 60.0 in | Wt 114.0 lb

## 2024-02-12 DIAGNOSIS — I872 Venous insufficiency (chronic) (peripheral): Secondary | ICD-10-CM | POA: Diagnosis not present

## 2024-02-12 DIAGNOSIS — D649 Anemia, unspecified: Secondary | ICD-10-CM

## 2024-02-12 DIAGNOSIS — D473 Essential (hemorrhagic) thrombocythemia: Secondary | ICD-10-CM | POA: Diagnosis present

## 2024-02-12 DIAGNOSIS — G8929 Other chronic pain: Secondary | ICD-10-CM | POA: Diagnosis not present

## 2024-02-12 DIAGNOSIS — D75839 Thrombocytosis, unspecified: Secondary | ICD-10-CM | POA: Diagnosis not present

## 2024-02-12 DIAGNOSIS — M47896 Other spondylosis, lumbar region: Secondary | ICD-10-CM | POA: Diagnosis not present

## 2024-02-12 DIAGNOSIS — I110 Hypertensive heart disease with heart failure: Secondary | ICD-10-CM | POA: Diagnosis not present

## 2024-02-12 DIAGNOSIS — R600 Localized edema: Secondary | ICD-10-CM | POA: Diagnosis not present

## 2024-02-12 DIAGNOSIS — I5042 Chronic combined systolic (congestive) and diastolic (congestive) heart failure: Secondary | ICD-10-CM | POA: Diagnosis not present

## 2024-02-12 LAB — CBC WITH DIFFERENTIAL (CANCER CENTER ONLY)
Abs Immature Granulocytes: 0.04 K/uL (ref 0.00–0.07)
Basophils Absolute: 0 K/uL (ref 0.0–0.1)
Basophils Relative: 1 %
Eosinophils Absolute: 0 K/uL (ref 0.0–0.5)
Eosinophils Relative: 1 %
HCT: 27.4 % — ABNORMAL LOW (ref 36.0–46.0)
Hemoglobin: 9.4 g/dL — ABNORMAL LOW (ref 12.0–15.0)
Immature Granulocytes: 1 %
Lymphocytes Relative: 9 %
Lymphs Abs: 0.4 K/uL — ABNORMAL LOW (ref 0.7–4.0)
MCH: 47.2 pg — ABNORMAL HIGH (ref 26.0–34.0)
MCHC: 34.3 g/dL (ref 30.0–36.0)
MCV: 137.7 fL — ABNORMAL HIGH (ref 80.0–100.0)
Monocytes Absolute: 0.4 K/uL (ref 0.1–1.0)
Monocytes Relative: 7 %
Neutro Abs: 4.3 K/uL (ref 1.7–7.7)
Neutrophils Relative %: 81 %
Platelet Count: 627 K/uL — ABNORMAL HIGH (ref 150–400)
RBC: 1.99 MIL/uL — ABNORMAL LOW (ref 3.87–5.11)
RDW: 14.2 % (ref 11.5–15.5)
WBC Count: 5.2 K/uL (ref 4.0–10.5)
nRBC: 0.4 % — ABNORMAL HIGH (ref 0.0–0.2)

## 2024-02-12 LAB — VITAMIN B12: Vitamin B-12: 541 pg/mL (ref 180–914)

## 2024-02-12 LAB — FERRITIN: Ferritin: 100 ng/mL (ref 11–307)

## 2024-02-12 LAB — IRON AND TIBC
Iron: 118 ug/dL (ref 28–170)
Saturation Ratios: 45 % — ABNORMAL HIGH (ref 10.4–31.8)
TIBC: 260 ug/dL (ref 250–450)
UIBC: 142 ug/dL

## 2024-02-12 LAB — FOLATE: Folate: 7.5 ng/mL (ref >5.9)

## 2024-02-12 MED ORDER — EPOETIN ALFA-EPBX 20000 UNIT/ML IJ SOLN
20000.0000 [IU] | Freq: Once | INTRAMUSCULAR | Status: AC
  Administered 2024-02-12: 20000 [IU] via SUBCUTANEOUS
  Filled 2024-02-12: qty 1

## 2024-02-12 NOTE — Patient Instructions (Signed)

## 2024-02-16 DIAGNOSIS — G8929 Other chronic pain: Secondary | ICD-10-CM | POA: Diagnosis not present

## 2024-02-16 DIAGNOSIS — R600 Localized edema: Secondary | ICD-10-CM | POA: Diagnosis not present

## 2024-02-16 DIAGNOSIS — I872 Venous insufficiency (chronic) (peripheral): Secondary | ICD-10-CM | POA: Diagnosis not present

## 2024-02-16 DIAGNOSIS — I5042 Chronic combined systolic (congestive) and diastolic (congestive) heart failure: Secondary | ICD-10-CM | POA: Diagnosis not present

## 2024-02-16 DIAGNOSIS — M47896 Other spondylosis, lumbar region: Secondary | ICD-10-CM | POA: Diagnosis not present

## 2024-02-16 DIAGNOSIS — I110 Hypertensive heart disease with heart failure: Secondary | ICD-10-CM | POA: Diagnosis not present

## 2024-02-19 DIAGNOSIS — I110 Hypertensive heart disease with heart failure: Secondary | ICD-10-CM | POA: Diagnosis not present

## 2024-02-19 DIAGNOSIS — I872 Venous insufficiency (chronic) (peripheral): Secondary | ICD-10-CM | POA: Diagnosis not present

## 2024-02-19 DIAGNOSIS — R600 Localized edema: Secondary | ICD-10-CM | POA: Diagnosis not present

## 2024-02-19 DIAGNOSIS — I5042 Chronic combined systolic (congestive) and diastolic (congestive) heart failure: Secondary | ICD-10-CM | POA: Diagnosis not present

## 2024-02-19 DIAGNOSIS — G8929 Other chronic pain: Secondary | ICD-10-CM | POA: Diagnosis not present

## 2024-02-19 DIAGNOSIS — M47896 Other spondylosis, lumbar region: Secondary | ICD-10-CM | POA: Diagnosis not present

## 2024-02-22 DIAGNOSIS — I872 Venous insufficiency (chronic) (peripheral): Secondary | ICD-10-CM | POA: Diagnosis not present

## 2024-02-22 DIAGNOSIS — I110 Hypertensive heart disease with heart failure: Secondary | ICD-10-CM | POA: Diagnosis not present

## 2024-02-22 DIAGNOSIS — G8929 Other chronic pain: Secondary | ICD-10-CM | POA: Diagnosis not present

## 2024-02-22 DIAGNOSIS — I5042 Chronic combined systolic (congestive) and diastolic (congestive) heart failure: Secondary | ICD-10-CM | POA: Diagnosis not present

## 2024-02-22 DIAGNOSIS — M47896 Other spondylosis, lumbar region: Secondary | ICD-10-CM | POA: Diagnosis not present

## 2024-02-22 DIAGNOSIS — R600 Localized edema: Secondary | ICD-10-CM | POA: Diagnosis not present

## 2024-02-25 DIAGNOSIS — M47896 Other spondylosis, lumbar region: Secondary | ICD-10-CM | POA: Diagnosis not present

## 2024-02-25 DIAGNOSIS — I110 Hypertensive heart disease with heart failure: Secondary | ICD-10-CM | POA: Diagnosis not present

## 2024-02-25 DIAGNOSIS — R600 Localized edema: Secondary | ICD-10-CM | POA: Diagnosis not present

## 2024-02-25 DIAGNOSIS — G8929 Other chronic pain: Secondary | ICD-10-CM | POA: Diagnosis not present

## 2024-02-25 DIAGNOSIS — I872 Venous insufficiency (chronic) (peripheral): Secondary | ICD-10-CM | POA: Diagnosis not present

## 2024-02-25 DIAGNOSIS — I5042 Chronic combined systolic (congestive) and diastolic (congestive) heart failure: Secondary | ICD-10-CM | POA: Diagnosis not present

## 2024-03-01 DIAGNOSIS — R600 Localized edema: Secondary | ICD-10-CM | POA: Diagnosis not present

## 2024-03-01 DIAGNOSIS — I110 Hypertensive heart disease with heart failure: Secondary | ICD-10-CM | POA: Diagnosis not present

## 2024-03-01 DIAGNOSIS — I5042 Chronic combined systolic (congestive) and diastolic (congestive) heart failure: Secondary | ICD-10-CM | POA: Diagnosis not present

## 2024-03-01 DIAGNOSIS — I872 Venous insufficiency (chronic) (peripheral): Secondary | ICD-10-CM | POA: Diagnosis not present

## 2024-03-01 DIAGNOSIS — G8929 Other chronic pain: Secondary | ICD-10-CM | POA: Diagnosis not present

## 2024-03-01 DIAGNOSIS — M47896 Other spondylosis, lumbar region: Secondary | ICD-10-CM | POA: Diagnosis not present

## 2024-03-02 ENCOUNTER — Ambulatory Visit: Attending: Cardiology

## 2024-03-02 DIAGNOSIS — R0609 Other forms of dyspnea: Secondary | ICD-10-CM | POA: Diagnosis not present

## 2024-03-02 LAB — ECHOCARDIOGRAM COMPLETE
AR max vel: 1.27 cm2
AV Area VTI: 1.25 cm2
AV Area mean vel: 1.46 cm2
AV Mean grad: 5 mmHg
AV Peak grad: 11 mmHg
Ao pk vel: 1.66 m/s
Area-P 1/2: 3.66 cm2
MV VTI: 0.86 cm2
S' Lateral: 2.2 cm

## 2024-03-03 DIAGNOSIS — I5042 Chronic combined systolic (congestive) and diastolic (congestive) heart failure: Secondary | ICD-10-CM | POA: Diagnosis not present

## 2024-03-03 DIAGNOSIS — M47896 Other spondylosis, lumbar region: Secondary | ICD-10-CM | POA: Diagnosis not present

## 2024-03-03 DIAGNOSIS — R600 Localized edema: Secondary | ICD-10-CM | POA: Diagnosis not present

## 2024-03-03 DIAGNOSIS — I872 Venous insufficiency (chronic) (peripheral): Secondary | ICD-10-CM | POA: Diagnosis not present

## 2024-03-03 DIAGNOSIS — G8929 Other chronic pain: Secondary | ICD-10-CM | POA: Diagnosis not present

## 2024-03-03 DIAGNOSIS — I110 Hypertensive heart disease with heart failure: Secondary | ICD-10-CM | POA: Diagnosis not present

## 2024-03-05 DIAGNOSIS — M47896 Other spondylosis, lumbar region: Secondary | ICD-10-CM | POA: Diagnosis not present

## 2024-03-05 DIAGNOSIS — E440 Moderate protein-calorie malnutrition: Secondary | ICD-10-CM | POA: Diagnosis not present

## 2024-03-05 DIAGNOSIS — D473 Essential (hemorrhagic) thrombocythemia: Secondary | ICD-10-CM | POA: Diagnosis not present

## 2024-03-05 DIAGNOSIS — K589 Irritable bowel syndrome without diarrhea: Secondary | ICD-10-CM | POA: Diagnosis not present

## 2024-03-05 DIAGNOSIS — G4700 Insomnia, unspecified: Secondary | ICD-10-CM | POA: Diagnosis not present

## 2024-03-05 DIAGNOSIS — I110 Hypertensive heart disease with heart failure: Secondary | ICD-10-CM | POA: Diagnosis not present

## 2024-03-05 DIAGNOSIS — E039 Hypothyroidism, unspecified: Secondary | ICD-10-CM | POA: Diagnosis not present

## 2024-03-05 DIAGNOSIS — F331 Major depressive disorder, recurrent, moderate: Secondary | ICD-10-CM | POA: Diagnosis not present

## 2024-03-05 DIAGNOSIS — I471 Supraventricular tachycardia, unspecified: Secondary | ICD-10-CM | POA: Diagnosis not present

## 2024-03-05 DIAGNOSIS — I5042 Chronic combined systolic (congestive) and diastolic (congestive) heart failure: Secondary | ICD-10-CM | POA: Diagnosis not present

## 2024-03-05 DIAGNOSIS — S81001D Unspecified open wound, right knee, subsequent encounter: Secondary | ICD-10-CM | POA: Diagnosis not present

## 2024-03-05 DIAGNOSIS — R600 Localized edema: Secondary | ICD-10-CM | POA: Diagnosis not present

## 2024-03-05 DIAGNOSIS — E785 Hyperlipidemia, unspecified: Secondary | ICD-10-CM | POA: Diagnosis not present

## 2024-03-05 DIAGNOSIS — S81002D Unspecified open wound, left knee, subsequent encounter: Secondary | ICD-10-CM | POA: Diagnosis not present

## 2024-03-05 DIAGNOSIS — F419 Anxiety disorder, unspecified: Secondary | ICD-10-CM | POA: Diagnosis not present

## 2024-03-05 DIAGNOSIS — D509 Iron deficiency anemia, unspecified: Secondary | ICD-10-CM | POA: Diagnosis not present

## 2024-03-05 DIAGNOSIS — I872 Venous insufficiency (chronic) (peripheral): Secondary | ICD-10-CM | POA: Diagnosis not present

## 2024-03-05 DIAGNOSIS — G8929 Other chronic pain: Secondary | ICD-10-CM | POA: Diagnosis not present

## 2024-03-05 DIAGNOSIS — E8809 Other disorders of plasma-protein metabolism, not elsewhere classified: Secondary | ICD-10-CM | POA: Diagnosis not present

## 2024-03-05 DIAGNOSIS — Z7982 Long term (current) use of aspirin: Secondary | ICD-10-CM | POA: Diagnosis not present

## 2024-03-08 ENCOUNTER — Telehealth: Payer: Self-pay

## 2024-03-08 ENCOUNTER — Telehealth: Payer: Self-pay | Admitting: Cardiology

## 2024-03-08 DIAGNOSIS — S81002D Unspecified open wound, left knee, subsequent encounter: Secondary | ICD-10-CM | POA: Diagnosis not present

## 2024-03-08 DIAGNOSIS — I5042 Chronic combined systolic (congestive) and diastolic (congestive) heart failure: Secondary | ICD-10-CM | POA: Diagnosis not present

## 2024-03-08 DIAGNOSIS — I872 Venous insufficiency (chronic) (peripheral): Secondary | ICD-10-CM | POA: Diagnosis not present

## 2024-03-08 DIAGNOSIS — I110 Hypertensive heart disease with heart failure: Secondary | ICD-10-CM | POA: Diagnosis not present

## 2024-03-08 DIAGNOSIS — R600 Localized edema: Secondary | ICD-10-CM | POA: Diagnosis not present

## 2024-03-08 DIAGNOSIS — S81001D Unspecified open wound, right knee, subsequent encounter: Secondary | ICD-10-CM | POA: Diagnosis not present

## 2024-03-08 NOTE — Telephone Encounter (Signed)
 Patient calling in about her echo results. Please advidse

## 2024-03-08 NOTE — Telephone Encounter (Signed)
 Number busy times 2

## 2024-03-08 NOTE — Telephone Encounter (Signed)
 Left message on My Chart with Echo results per Dr. Karry note. Routed to PCP.

## 2024-03-09 NOTE — Telephone Encounter (Signed)
 Results reviewed with pt as per Dr. Vanetta Shawl note.  Pt verbalized understanding and had no additional questions. Routed to PCP

## 2024-03-10 DIAGNOSIS — R600 Localized edema: Secondary | ICD-10-CM | POA: Diagnosis not present

## 2024-03-10 DIAGNOSIS — S81002D Unspecified open wound, left knee, subsequent encounter: Secondary | ICD-10-CM | POA: Diagnosis not present

## 2024-03-10 DIAGNOSIS — S81001D Unspecified open wound, right knee, subsequent encounter: Secondary | ICD-10-CM | POA: Diagnosis not present

## 2024-03-10 DIAGNOSIS — I5042 Chronic combined systolic (congestive) and diastolic (congestive) heart failure: Secondary | ICD-10-CM | POA: Diagnosis not present

## 2024-03-10 DIAGNOSIS — I110 Hypertensive heart disease with heart failure: Secondary | ICD-10-CM | POA: Diagnosis not present

## 2024-03-10 DIAGNOSIS — I872 Venous insufficiency (chronic) (peripheral): Secondary | ICD-10-CM | POA: Diagnosis not present

## 2024-03-11 ENCOUNTER — Encounter

## 2024-03-11 ENCOUNTER — Other Ambulatory Visit: Payer: Self-pay | Admitting: Pharmacist

## 2024-03-11 ENCOUNTER — Inpatient Hospital Stay

## 2024-03-11 ENCOUNTER — Inpatient Hospital Stay: Attending: Oncology

## 2024-03-11 VITALS — BP 147/71 | HR 79 | Temp 97.9°F | Resp 16

## 2024-03-11 DIAGNOSIS — D473 Essential (hemorrhagic) thrombocythemia: Secondary | ICD-10-CM | POA: Insufficient documentation

## 2024-03-11 DIAGNOSIS — D649 Anemia, unspecified: Secondary | ICD-10-CM

## 2024-03-11 DIAGNOSIS — D75839 Thrombocytosis, unspecified: Secondary | ICD-10-CM | POA: Insufficient documentation

## 2024-03-11 LAB — CBC WITH DIFFERENTIAL (CANCER CENTER ONLY)
Abs Immature Granulocytes: 0.05 K/uL (ref 0.00–0.07)
Basophils Absolute: 0 K/uL (ref 0.0–0.1)
Basophils Relative: 0 %
Eosinophils Absolute: 0 K/uL (ref 0.0–0.5)
Eosinophils Relative: 0 %
HCT: 23.1 % — ABNORMAL LOW (ref 36.0–46.0)
Hemoglobin: 7.8 g/dL — ABNORMAL LOW (ref 12.0–15.0)
Immature Granulocytes: 1 %
Lymphocytes Relative: 10 %
Lymphs Abs: 0.7 K/uL (ref 0.7–4.0)
MCH: 48.8 pg — ABNORMAL HIGH (ref 26.0–34.0)
MCHC: 33.8 g/dL (ref 30.0–36.0)
MCV: 144.4 fL — ABNORMAL HIGH (ref 80.0–100.0)
Monocytes Absolute: 0.6 K/uL (ref 0.1–1.0)
Monocytes Relative: 8 %
Neutro Abs: 5.7 K/uL (ref 1.7–7.7)
Neutrophils Relative %: 81 %
Platelet Count: 1217 K/uL (ref 150–400)
RBC: 1.6 MIL/uL — ABNORMAL LOW (ref 3.87–5.11)
RDW: 17.9 % — ABNORMAL HIGH (ref 11.5–15.5)
WBC Count: 7 K/uL (ref 4.0–10.5)
nRBC: 0.3 % — ABNORMAL HIGH (ref 0.0–0.2)

## 2024-03-11 LAB — ABO/RH: ABO/RH(D): O POS

## 2024-03-11 LAB — PREPARE RBC (CROSSMATCH)

## 2024-03-11 MED ORDER — EPOETIN ALFA-EPBX 20000 UNIT/ML IJ SOLN
20000.0000 [IU] | Freq: Once | INTRAMUSCULAR | Status: AC
  Administered 2024-03-11: 20000 [IU] via SUBCUTANEOUS
  Filled 2024-03-11: qty 1

## 2024-03-11 NOTE — Progress Notes (Signed)
 Patient here for epoetin . Platelets up to 1,217,000 and HgB down to 7.8. She denies missed doses of hydroxyurea . She denies recent illness. Will increase hydroxyurea  500 mg to tid and plan to see her back in 2 weeks with repeat CBC.

## 2024-03-11 NOTE — Progress Notes (Signed)
 CRITICAL VALUE STICKER  CRITICAL VALUE:  Plt [id number]  RECEIVER (on-site recipient of call):  Woodie Kapur RN  DATE & TIME NOTIFIED:   03/11/2024 @ 1107  MESSENGER (representative from lab):  [facility]  MD NOTIFIED:   Andrez Foy PA  TIME OF NOTIFICATION:  1130  RESPONSE:  Aware.

## 2024-03-11 NOTE — Addendum Note (Signed)
 Addended by: BERKELEY SPANNER A on: 03/11/2024 11:52 AM   Modules accepted: Orders

## 2024-03-11 NOTE — Progress Notes (Signed)
 Patient declines flu vaccine.

## 2024-03-11 NOTE — Patient Instructions (Signed)

## 2024-03-14 ENCOUNTER — Inpatient Hospital Stay

## 2024-03-14 DIAGNOSIS — D649 Anemia, unspecified: Secondary | ICD-10-CM

## 2024-03-14 DIAGNOSIS — D473 Essential (hemorrhagic) thrombocythemia: Secondary | ICD-10-CM | POA: Diagnosis not present

## 2024-03-14 DIAGNOSIS — D75839 Thrombocytosis, unspecified: Secondary | ICD-10-CM | POA: Diagnosis not present

## 2024-03-14 MED ORDER — ACETAMINOPHEN 325 MG PO TABS
650.0000 mg | ORAL_TABLET | Freq: Once | ORAL | Status: DC
  Filled 2024-03-14: qty 2

## 2024-03-14 MED ORDER — DIPHENHYDRAMINE HCL 25 MG PO CAPS
25.0000 mg | ORAL_CAPSULE | Freq: Once | ORAL | Status: DC
  Filled 2024-03-14: qty 1

## 2024-03-14 NOTE — Patient Instructions (Signed)

## 2024-03-15 DIAGNOSIS — R600 Localized edema: Secondary | ICD-10-CM | POA: Diagnosis not present

## 2024-03-15 DIAGNOSIS — I5042 Chronic combined systolic (congestive) and diastolic (congestive) heart failure: Secondary | ICD-10-CM | POA: Diagnosis not present

## 2024-03-15 DIAGNOSIS — I110 Hypertensive heart disease with heart failure: Secondary | ICD-10-CM | POA: Diagnosis not present

## 2024-03-15 DIAGNOSIS — I872 Venous insufficiency (chronic) (peripheral): Secondary | ICD-10-CM | POA: Diagnosis not present

## 2024-03-15 DIAGNOSIS — S81002D Unspecified open wound, left knee, subsequent encounter: Secondary | ICD-10-CM | POA: Diagnosis not present

## 2024-03-15 DIAGNOSIS — S81001D Unspecified open wound, right knee, subsequent encounter: Secondary | ICD-10-CM | POA: Diagnosis not present

## 2024-03-15 LAB — BPAM RBC
Blood Product Expiration Date: 202511062359
ISSUE DATE / TIME: 202510130656
Unit Type and Rh: 5100

## 2024-03-15 LAB — TYPE AND SCREEN
ABO/RH(D): O POS
Antibody Screen: NEGATIVE
Unit division: 0

## 2024-03-17 DIAGNOSIS — I872 Venous insufficiency (chronic) (peripheral): Secondary | ICD-10-CM | POA: Diagnosis not present

## 2024-03-17 DIAGNOSIS — S81001D Unspecified open wound, right knee, subsequent encounter: Secondary | ICD-10-CM | POA: Diagnosis not present

## 2024-03-17 DIAGNOSIS — R600 Localized edema: Secondary | ICD-10-CM | POA: Diagnosis not present

## 2024-03-17 DIAGNOSIS — I5042 Chronic combined systolic (congestive) and diastolic (congestive) heart failure: Secondary | ICD-10-CM | POA: Diagnosis not present

## 2024-03-17 DIAGNOSIS — S81002D Unspecified open wound, left knee, subsequent encounter: Secondary | ICD-10-CM | POA: Diagnosis not present

## 2024-03-17 DIAGNOSIS — I110 Hypertensive heart disease with heart failure: Secondary | ICD-10-CM | POA: Diagnosis not present

## 2024-03-20 ENCOUNTER — Other Ambulatory Visit: Payer: Self-pay | Admitting: Cardiology

## 2024-03-23 ENCOUNTER — Other Ambulatory Visit (HOSPITAL_BASED_OUTPATIENT_CLINIC_OR_DEPARTMENT_OTHER): Payer: Self-pay

## 2024-03-23 ENCOUNTER — Encounter: Payer: Self-pay | Admitting: Cardiology

## 2024-03-23 ENCOUNTER — Other Ambulatory Visit: Payer: Self-pay

## 2024-03-23 ENCOUNTER — Other Ambulatory Visit: Payer: Self-pay | Admitting: Cardiology

## 2024-03-23 DIAGNOSIS — I872 Venous insufficiency (chronic) (peripheral): Secondary | ICD-10-CM | POA: Diagnosis not present

## 2024-03-23 DIAGNOSIS — S81001D Unspecified open wound, right knee, subsequent encounter: Secondary | ICD-10-CM | POA: Diagnosis not present

## 2024-03-23 DIAGNOSIS — I5042 Chronic combined systolic (congestive) and diastolic (congestive) heart failure: Secondary | ICD-10-CM | POA: Diagnosis not present

## 2024-03-23 DIAGNOSIS — S81002D Unspecified open wound, left knee, subsequent encounter: Secondary | ICD-10-CM | POA: Diagnosis not present

## 2024-03-23 DIAGNOSIS — I110 Hypertensive heart disease with heart failure: Secondary | ICD-10-CM | POA: Diagnosis not present

## 2024-03-23 DIAGNOSIS — R600 Localized edema: Secondary | ICD-10-CM | POA: Diagnosis not present

## 2024-03-23 MED ORDER — SACUBITRIL-VALSARTAN 49-51 MG PO TABS: 1.0000 | ORAL_TABLET | Freq: Two times a day (BID) | ORAL | 3 refills | Status: AC

## 2024-03-24 NOTE — Progress Notes (Unsigned)
 Jordan Higgins at [facility] [address] [place],  [place]  [address] [phone]  Clinic Day:   02/12/2024  Referring physician: Erick Greig LABOR, NP  HISTORY OF PRESENT ILLNESS:  The patient is a 78 y.o. female with essential thrombocythemia.  He comes in today to discuss her peripheral counts.  Of note, she remains on hydroxyurea  500 mg twice daily.  This patient also receives monthly Retacrit  injections as her hemoglobin has been persistently below 10.  Since her last visit, the patient has been doing somewhat better.  Despite her elevated platelets, she denies having any clotting complications.  PHYSICAL EXAM:  There were no vitals taken for this visit. Wt Readings from Last 3 Encounters:  02/12/24 114 lb (51.7 kg)  02/04/24 117 lb (53.1 kg)  12/18/23 124 lb 8 oz (56.5 kg)   There is no height or weight on file to calculate BMI. Performance status (ECOG): 2 Physical Exam Constitutional:      Appearance: Normal appearance. She is not ill-appearing.     Comments: She is ambulating with a walker  HENT:     Mouth/Throat:     Mouth: Mucous membranes are moist.     Pharynx: Oropharynx is clear. No oropharyngeal exudate or posterior oropharyngeal erythema.  Cardiovascular:     Rate and Rhythm: Normal rate and regular rhythm.     Heart sounds: No murmur heard.    No friction rub. No gallop.  Pulmonary:     Effort: Pulmonary effort is normal. No respiratory distress.     Breath sounds: Normal breath sounds. No wheezing, rhonchi or rales.  Abdominal:     General: Bowel sounds are normal. There is no distension.     Palpations: Abdomen is soft. There is no mass.     Tenderness: There is no abdominal tenderness.  Musculoskeletal:        General: No swelling.     Right lower leg: 1+ Edema present.     Left lower leg: 1+ Edema present.  Lymphadenopathy:     Cervical: No cervical adenopathy.     Upper Body:     Right upper body: No supraclavicular or axillary  adenopathy.     Left upper body: No supraclavicular or axillary adenopathy.     Lower Body: No right inguinal adenopathy. No left inguinal adenopathy.  Skin:    General: Skin is warm.     Coloration: Skin is not jaundiced.     Findings: No lesion or rash.  Neurological:     General: No focal deficit present.     Mental Status: She is alert and oriented to person, place, and time. Mental status is at baseline.  Psychiatric:        Mood and Affect: Mood normal.        Behavior: Behavior normal.        Thought Content: Thought content normal.    LABS:      Latest Ref Rng & Units 03/11/2024   10:37 AM 02/12/2024   10:51 AM 01/15/2024   10:21 AM  CBC  WBC 4.0 - 10.5 K/uL 7.0  5.2  8.4   Hemoglobin 12.0 - 15.0 g/dL 7.8  9.4  88.5   Hematocrit 36.0 - 46.0 % 23.1  27.4  33.5   Platelets 150 - 400 K/uL 1,217  627  1,359     Latest Reference Range & Units 11/27/23 10:44 12/02/23 08:55 12/18/23 09:49  MCV 80.0 - 100.0 fL 143.9 (H) 144.3 (H) 148.3 (  H)  (H): Data is abnormally high  Latest Reference Range & Units 11/27/23 10:44  Iron 28 - 170 ug/dL 68  UIBC ug/dL 828  TIBC 749 - 549 ug/dL 760 (L)  Saturation Ratios 10.4 - 31.8 % 28  Ferritin 11 - 307 ng/mL 139  Folate >5.9 ng/mL 8.1  Vitamin B12 180 - 914 pg/mL 422  (L): Data is abnormally low  PATHOLOGY:  Her bone marrow biopsy revealed the following: BONE MARROW, ASPIRATE, CLOT, CORE:  - Cellular bone marrow with megakaryocytic hyperplasia and otherwise  orderly myeloid and erythroid maturation   PERIPHERAL BLOOD:  - Thrombocytosis   BONE MARROW ASPIRATE: The bone marrow aspirate smear slides contain  several cellular bone marrow spicules which are adequate for  interpretation.  Erythroid precursors:  The erythroid series is present in adequate  number with orderly maturation and unremarkable morphology.  Granulocytic precursors: The myeloid series is present in adequate  number with orderly maturation and unremarkable  morphology.  Megakaryocytes: The megakaryocytes are present in increased number some  with hyper lobulation as well as a subset with hyper lobulation.  Lymphocytes/plasma cells: Lymphocytes and plasma cells are not  increased.   CLOT AND BIOPSY: The biopsy consists of a very scant core of periosteum and cortical bone with significant aspiration artifact and is overall suboptimal to inadequate for evaluation.  There is very limited hematopoietic marrow for evaluation.  It is noted that there are increased megakaryocytes with clustering.  The clot section has a limited number of hematopoietic spicules ranging in cellularity from 5% to focally 50% with an overall cellularity interpreted as 20% on limited material and possible subcortical location given the biopsy.  There is significant megakaryocytic clustering present with abnormally lobulated megakaryocytes.  There is otherwise orderly myeloid and erythroid maturation.   IMMUNOHISTOCHEMICAL STAINS: Immunohistochemical stains were performed in both biopsy and clot section.  There is no increase in CD34/CD117 blast/immature precursors.  E-cadherin and myeloperoxidase highlight the erythroid and myeloid compartment respectively.  SPECIAL STAINS: Trichrome and reticulin stains were not performed to evaluate for the presence of fibrosis due to the limited material on the biopsy.   IRON STAIN: Iron stains are performed on a bone marrow aspirate or touch  imprint smear and section of clot. The controls stained appropriately.        Storage Iron: Adequate histiocytic iron stores       Ring Sideroblasts: Not identified     ASSESSMENT & PLAN:  Assessment/Plan:  An 78 y.o. female with essential thrombocythemia.  When evaluating her labs today, there has been a significant improvement in her platelets.  Ideally, the goal is to get her platelets less than 400-600.  Although she is not there currently, she will remain on hydroxyurea  500 mg  twice daily with the hopes that her platelets will continue to fall to desired levels.  As her hemoglobin remains less than 10, she will receive her Retacrit  injection today.  She will continue to receive monthly Retacrit  injections whenever her hemoglobin is below 10.  Her CBC will continue to be followed once every 4 weeks to assess the status of her peripheral counts.  I will see her back in 12 weeks for repeat clinical assessment.  The patient understands all the plans discussed today and is in agreement with them.    Kenika Sahm DELENA Kerns, MD

## 2024-03-25 ENCOUNTER — Other Ambulatory Visit: Payer: Self-pay | Admitting: Oncology

## 2024-03-25 ENCOUNTER — Inpatient Hospital Stay

## 2024-03-25 ENCOUNTER — Inpatient Hospital Stay: Admitting: Oncology

## 2024-03-25 VITALS — BP 156/78 | HR 86 | Temp 97.9°F | Resp 16 | Ht 60.0 in | Wt 117.5 lb

## 2024-03-25 DIAGNOSIS — D473 Essential (hemorrhagic) thrombocythemia: Secondary | ICD-10-CM

## 2024-03-25 DIAGNOSIS — D75839 Thrombocytosis, unspecified: Secondary | ICD-10-CM | POA: Diagnosis not present

## 2024-03-25 LAB — CBC WITH DIFFERENTIAL (CANCER CENTER ONLY)
Abs Immature Granulocytes: 0.04 K/uL (ref 0.00–0.07)
Basophils Absolute: 0 K/uL (ref 0.0–0.1)
Basophils Relative: 1 %
Eosinophils Absolute: 0 K/uL (ref 0.0–0.5)
Eosinophils Relative: 0 %
HCT: 29.8 % — ABNORMAL LOW (ref 36.0–46.0)
Hemoglobin: 10 g/dL — ABNORMAL LOW (ref 12.0–15.0)
Immature Granulocytes: 1 %
Lymphocytes Relative: 7 %
Lymphs Abs: 0.5 K/uL — ABNORMAL LOW (ref 0.7–4.0)
MCH: 43.3 pg — ABNORMAL HIGH (ref 26.0–34.0)
MCHC: 33.6 g/dL (ref 30.0–36.0)
MCV: 129 fL — ABNORMAL HIGH (ref 80.0–100.0)
Monocytes Absolute: 0.5 K/uL (ref 0.1–1.0)
Monocytes Relative: 7 %
Neutro Abs: 6.4 K/uL (ref 1.7–7.7)
Neutrophils Relative %: 84 %
Platelet Count: 817 K/uL — ABNORMAL HIGH (ref 150–400)
RBC: 2.31 MIL/uL — ABNORMAL LOW (ref 3.87–5.11)
WBC Count: 7.5 K/uL (ref 4.0–10.5)
nRBC: 0 % (ref 0.0–0.2)

## 2024-03-25 LAB — IRON AND TIBC
Iron: 96 ug/dL (ref 28–170)
Saturation Ratios: 40 % — ABNORMAL HIGH (ref 10.4–31.8)
TIBC: 237 ug/dL — ABNORMAL LOW (ref 250–450)
UIBC: 141 ug/dL

## 2024-03-25 LAB — FERRITIN: Ferritin: 182 ng/mL (ref 11–307)

## 2024-03-27 LAB — SOLUBLE TRANSFERRIN RECEPTOR: Transferrin Receptor: 27.5 nmol/L — ABNORMAL HIGH (ref 12.2–27.3)

## 2024-03-28 ENCOUNTER — Other Ambulatory Visit (HOSPITAL_BASED_OUTPATIENT_CLINIC_OR_DEPARTMENT_OTHER): Payer: Self-pay

## 2024-03-30 ENCOUNTER — Other Ambulatory Visit (HOSPITAL_COMMUNITY): Payer: Self-pay

## 2024-03-30 DIAGNOSIS — I872 Venous insufficiency (chronic) (peripheral): Secondary | ICD-10-CM | POA: Diagnosis not present

## 2024-03-30 DIAGNOSIS — I110 Hypertensive heart disease with heart failure: Secondary | ICD-10-CM | POA: Diagnosis not present

## 2024-03-30 DIAGNOSIS — S81001D Unspecified open wound, right knee, subsequent encounter: Secondary | ICD-10-CM | POA: Diagnosis not present

## 2024-03-30 DIAGNOSIS — R600 Localized edema: Secondary | ICD-10-CM | POA: Diagnosis not present

## 2024-03-30 DIAGNOSIS — S81002D Unspecified open wound, left knee, subsequent encounter: Secondary | ICD-10-CM | POA: Diagnosis not present

## 2024-03-30 DIAGNOSIS — I5042 Chronic combined systolic (congestive) and diastolic (congestive) heart failure: Secondary | ICD-10-CM | POA: Diagnosis not present

## 2024-04-04 DIAGNOSIS — E039 Hypothyroidism, unspecified: Secondary | ICD-10-CM | POA: Diagnosis not present

## 2024-04-04 DIAGNOSIS — I872 Venous insufficiency (chronic) (peripheral): Secondary | ICD-10-CM | POA: Diagnosis not present

## 2024-04-04 DIAGNOSIS — G4700 Insomnia, unspecified: Secondary | ICD-10-CM | POA: Diagnosis not present

## 2024-04-04 DIAGNOSIS — D509 Iron deficiency anemia, unspecified: Secondary | ICD-10-CM | POA: Diagnosis not present

## 2024-04-04 DIAGNOSIS — I471 Supraventricular tachycardia, unspecified: Secondary | ICD-10-CM | POA: Diagnosis not present

## 2024-04-04 DIAGNOSIS — G8929 Other chronic pain: Secondary | ICD-10-CM | POA: Diagnosis not present

## 2024-04-04 DIAGNOSIS — M47896 Other spondylosis, lumbar region: Secondary | ICD-10-CM | POA: Diagnosis not present

## 2024-04-04 DIAGNOSIS — E440 Moderate protein-calorie malnutrition: Secondary | ICD-10-CM | POA: Diagnosis not present

## 2024-04-04 DIAGNOSIS — R600 Localized edema: Secondary | ICD-10-CM | POA: Diagnosis not present

## 2024-04-04 DIAGNOSIS — F331 Major depressive disorder, recurrent, moderate: Secondary | ICD-10-CM | POA: Diagnosis not present

## 2024-04-04 DIAGNOSIS — D473 Essential (hemorrhagic) thrombocythemia: Secondary | ICD-10-CM | POA: Diagnosis not present

## 2024-04-04 DIAGNOSIS — I110 Hypertensive heart disease with heart failure: Secondary | ICD-10-CM | POA: Diagnosis not present

## 2024-04-04 DIAGNOSIS — Z7982 Long term (current) use of aspirin: Secondary | ICD-10-CM | POA: Diagnosis not present

## 2024-04-04 DIAGNOSIS — S81002D Unspecified open wound, left knee, subsequent encounter: Secondary | ICD-10-CM | POA: Diagnosis not present

## 2024-04-04 DIAGNOSIS — S81001D Unspecified open wound, right knee, subsequent encounter: Secondary | ICD-10-CM | POA: Diagnosis not present

## 2024-04-04 DIAGNOSIS — I5042 Chronic combined systolic (congestive) and diastolic (congestive) heart failure: Secondary | ICD-10-CM | POA: Diagnosis not present

## 2024-04-04 DIAGNOSIS — K589 Irritable bowel syndrome without diarrhea: Secondary | ICD-10-CM | POA: Diagnosis not present

## 2024-04-04 DIAGNOSIS — F419 Anxiety disorder, unspecified: Secondary | ICD-10-CM | POA: Diagnosis not present

## 2024-04-04 DIAGNOSIS — E8809 Other disorders of plasma-protein metabolism, not elsewhere classified: Secondary | ICD-10-CM | POA: Diagnosis not present

## 2024-04-04 DIAGNOSIS — E785 Hyperlipidemia, unspecified: Secondary | ICD-10-CM | POA: Diagnosis not present

## 2024-04-05 DIAGNOSIS — I872 Venous insufficiency (chronic) (peripheral): Secondary | ICD-10-CM | POA: Diagnosis not present

## 2024-04-05 DIAGNOSIS — S81001D Unspecified open wound, right knee, subsequent encounter: Secondary | ICD-10-CM | POA: Diagnosis not present

## 2024-04-05 DIAGNOSIS — R600 Localized edema: Secondary | ICD-10-CM | POA: Diagnosis not present

## 2024-04-05 DIAGNOSIS — I110 Hypertensive heart disease with heart failure: Secondary | ICD-10-CM | POA: Diagnosis not present

## 2024-04-05 DIAGNOSIS — I5042 Chronic combined systolic (congestive) and diastolic (congestive) heart failure: Secondary | ICD-10-CM | POA: Diagnosis not present

## 2024-04-05 DIAGNOSIS — S81002D Unspecified open wound, left knee, subsequent encounter: Secondary | ICD-10-CM | POA: Diagnosis not present

## 2024-04-08 ENCOUNTER — Inpatient Hospital Stay: Attending: Oncology

## 2024-04-08 ENCOUNTER — Inpatient Hospital Stay

## 2024-04-08 VITALS — BP 157/73 | HR 63 | Temp 98.1°F | Resp 18 | Ht 60.0 in

## 2024-04-08 DIAGNOSIS — D75839 Thrombocytosis, unspecified: Secondary | ICD-10-CM | POA: Insufficient documentation

## 2024-04-08 DIAGNOSIS — D649 Anemia, unspecified: Secondary | ICD-10-CM | POA: Insufficient documentation

## 2024-04-08 DIAGNOSIS — D473 Essential (hemorrhagic) thrombocythemia: Secondary | ICD-10-CM | POA: Diagnosis not present

## 2024-04-08 LAB — CBC WITH DIFFERENTIAL (CANCER CENTER ONLY)
Abs Immature Granulocytes: 0.1 K/uL — ABNORMAL HIGH (ref 0.00–0.07)
Basophils Absolute: 0.1 K/uL (ref 0.0–0.1)
Basophils Relative: 1 %
Eosinophils Absolute: 0 K/uL (ref 0.0–0.5)
Eosinophils Relative: 1 %
HCT: 26.1 % — ABNORMAL LOW (ref 36.0–46.0)
Hemoglobin: 8.8 g/dL — ABNORMAL LOW (ref 12.0–15.0)
Immature Granulocytes: 2 %
Lymphocytes Relative: 8 %
Lymphs Abs: 0.5 K/uL — ABNORMAL LOW (ref 0.7–4.0)
MCH: 44.7 pg — ABNORMAL HIGH (ref 26.0–34.0)
MCHC: 33.7 g/dL (ref 30.0–36.0)
MCV: 132.5 fL — ABNORMAL HIGH (ref 80.0–100.0)
Monocytes Absolute: 0.5 K/uL (ref 0.1–1.0)
Monocytes Relative: 8 %
Neutro Abs: 4.7 K/uL (ref 1.7–7.7)
Neutrophils Relative %: 80 %
Platelet Count: 623 K/uL — ABNORMAL HIGH (ref 150–400)
RBC: 1.97 MIL/uL — ABNORMAL LOW (ref 3.87–5.11)
WBC Count: 5.8 K/uL (ref 4.0–10.5)
nRBC: 0 % (ref 0.0–0.2)

## 2024-04-08 MED ORDER — EPOETIN ALFA-EPBX 20000 UNIT/ML IJ SOLN
20000.0000 [IU] | Freq: Once | INTRAMUSCULAR | Status: AC
  Administered 2024-04-08: 20000 [IU] via SUBCUTANEOUS
  Filled 2024-04-08: qty 1

## 2024-04-08 NOTE — Patient Instructions (Signed)

## 2024-04-08 NOTE — Progress Notes (Addendum)
 1139 OKAY TO PROCEED WITH THE RETACRIT  SHOT TODAY , DESPITE LOW IRON LEVELS PER DR. EZZARD. THE PATIENT AND CAREGIVER UNDERSTANDS TO CALL IF THE PATIENT HAS ANY PROBLEMS PRIOR TO HER 12/5 APPT.

## 2024-04-12 DIAGNOSIS — L089 Local infection of the skin and subcutaneous tissue, unspecified: Secondary | ICD-10-CM | POA: Diagnosis not present

## 2024-04-12 DIAGNOSIS — S90425A Blister (nonthermal), left lesser toe(s), initial encounter: Secondary | ICD-10-CM | POA: Diagnosis not present

## 2024-04-12 DIAGNOSIS — I10 Essential (primary) hypertension: Secondary | ICD-10-CM | POA: Diagnosis not present

## 2024-04-13 DIAGNOSIS — I110 Hypertensive heart disease with heart failure: Secondary | ICD-10-CM | POA: Diagnosis not present

## 2024-04-13 DIAGNOSIS — I5042 Chronic combined systolic (congestive) and diastolic (congestive) heart failure: Secondary | ICD-10-CM | POA: Diagnosis not present

## 2024-04-13 DIAGNOSIS — S81001D Unspecified open wound, right knee, subsequent encounter: Secondary | ICD-10-CM | POA: Diagnosis not present

## 2024-04-13 DIAGNOSIS — S81002D Unspecified open wound, left knee, subsequent encounter: Secondary | ICD-10-CM | POA: Diagnosis not present

## 2024-04-13 DIAGNOSIS — R600 Localized edema: Secondary | ICD-10-CM | POA: Diagnosis not present

## 2024-04-13 DIAGNOSIS — I872 Venous insufficiency (chronic) (peripheral): Secondary | ICD-10-CM | POA: Diagnosis not present

## 2024-04-20 DIAGNOSIS — S81002D Unspecified open wound, left knee, subsequent encounter: Secondary | ICD-10-CM | POA: Diagnosis not present

## 2024-04-20 DIAGNOSIS — I872 Venous insufficiency (chronic) (peripheral): Secondary | ICD-10-CM | POA: Diagnosis not present

## 2024-04-20 DIAGNOSIS — I110 Hypertensive heart disease with heart failure: Secondary | ICD-10-CM | POA: Diagnosis not present

## 2024-04-20 DIAGNOSIS — I5042 Chronic combined systolic (congestive) and diastolic (congestive) heart failure: Secondary | ICD-10-CM | POA: Diagnosis not present

## 2024-04-20 DIAGNOSIS — R600 Localized edema: Secondary | ICD-10-CM | POA: Diagnosis not present

## 2024-04-20 DIAGNOSIS — S81001D Unspecified open wound, right knee, subsequent encounter: Secondary | ICD-10-CM | POA: Diagnosis not present

## 2024-04-27 DIAGNOSIS — I5042 Chronic combined systolic (congestive) and diastolic (congestive) heart failure: Secondary | ICD-10-CM | POA: Diagnosis not present

## 2024-04-27 DIAGNOSIS — I872 Venous insufficiency (chronic) (peripheral): Secondary | ICD-10-CM | POA: Diagnosis not present

## 2024-04-27 DIAGNOSIS — S81001D Unspecified open wound, right knee, subsequent encounter: Secondary | ICD-10-CM | POA: Diagnosis not present

## 2024-04-27 DIAGNOSIS — R600 Localized edema: Secondary | ICD-10-CM | POA: Diagnosis not present

## 2024-04-27 DIAGNOSIS — I110 Hypertensive heart disease with heart failure: Secondary | ICD-10-CM | POA: Diagnosis not present

## 2024-04-27 DIAGNOSIS — S81002D Unspecified open wound, left knee, subsequent encounter: Secondary | ICD-10-CM | POA: Diagnosis not present

## 2024-05-03 DIAGNOSIS — I110 Hypertensive heart disease with heart failure: Secondary | ICD-10-CM | POA: Diagnosis not present

## 2024-05-03 DIAGNOSIS — I5042 Chronic combined systolic (congestive) and diastolic (congestive) heart failure: Secondary | ICD-10-CM | POA: Diagnosis not present

## 2024-05-03 DIAGNOSIS — R600 Localized edema: Secondary | ICD-10-CM | POA: Diagnosis not present

## 2024-05-03 DIAGNOSIS — I872 Venous insufficiency (chronic) (peripheral): Secondary | ICD-10-CM | POA: Diagnosis not present

## 2024-05-03 DIAGNOSIS — S81002D Unspecified open wound, left knee, subsequent encounter: Secondary | ICD-10-CM | POA: Diagnosis not present

## 2024-05-03 DIAGNOSIS — S81001D Unspecified open wound, right knee, subsequent encounter: Secondary | ICD-10-CM | POA: Diagnosis not present

## 2024-05-05 ENCOUNTER — Ambulatory Visit: Admitting: Oncology

## 2024-05-05 ENCOUNTER — Ambulatory Visit

## 2024-05-05 ENCOUNTER — Inpatient Hospital Stay: Attending: Oncology

## 2024-05-05 ENCOUNTER — Inpatient Hospital Stay

## 2024-05-05 VITALS — BP 176/96 | HR 71 | Temp 97.7°F | Resp 18

## 2024-05-05 DIAGNOSIS — D473 Essential (hemorrhagic) thrombocythemia: Secondary | ICD-10-CM | POA: Diagnosis present

## 2024-05-05 DIAGNOSIS — D649 Anemia, unspecified: Secondary | ICD-10-CM

## 2024-05-05 DIAGNOSIS — D75839 Thrombocytosis, unspecified: Secondary | ICD-10-CM | POA: Diagnosis not present

## 2024-05-05 LAB — CBC WITH DIFFERENTIAL (CANCER CENTER ONLY)
Abs Immature Granulocytes: 0.03 K/uL (ref 0.00–0.07)
Basophils Absolute: 0 K/uL (ref 0.0–0.1)
Basophils Relative: 1 %
Eosinophils Absolute: 0 K/uL (ref 0.0–0.5)
Eosinophils Relative: 1 %
HCT: 26.9 % — ABNORMAL LOW (ref 36.0–46.0)
Hemoglobin: 9.1 g/dL — ABNORMAL LOW (ref 12.0–15.0)
Immature Granulocytes: 1 %
Lymphocytes Relative: 11 %
Lymphs Abs: 0.6 K/uL — ABNORMAL LOW (ref 0.7–4.0)
MCH: 47.9 pg — ABNORMAL HIGH (ref 26.0–34.0)
MCHC: 33.8 g/dL (ref 30.0–36.0)
MCV: 141.6 fL — ABNORMAL HIGH (ref 80.0–100.0)
Monocytes Absolute: 0.4 K/uL (ref 0.1–1.0)
Monocytes Relative: 8 %
Neutro Abs: 4.2 K/uL (ref 1.7–7.7)
Neutrophils Relative %: 78 %
Platelet Count: 691 K/uL — ABNORMAL HIGH (ref 150–400)
RBC: 1.9 MIL/uL — ABNORMAL LOW (ref 3.87–5.11)
WBC Count: 5.3 K/uL (ref 4.0–10.5)
nRBC: 0 % (ref 0.0–0.2)

## 2024-05-05 MED ORDER — EPOETIN ALFA-EPBX 20000 UNIT/ML IJ SOLN
20000.0000 [IU] | Freq: Once | INTRAMUSCULAR | Status: AC
  Administered 2024-05-05: 20000 [IU] via SUBCUTANEOUS
  Filled 2024-05-05: qty 1

## 2024-05-05 NOTE — Patient Instructions (Signed)

## 2024-05-06 ENCOUNTER — Ambulatory Visit: Admitting: Cardiology

## 2024-05-23 ENCOUNTER — Encounter: Payer: Self-pay | Admitting: Cardiology

## 2024-05-23 ENCOUNTER — Ambulatory Visit: Attending: Cardiology | Admitting: Cardiology

## 2024-05-23 VITALS — BP 138/68 | HR 78 | Ht 60.0 in | Wt 115.0 lb

## 2024-05-23 DIAGNOSIS — E785 Hyperlipidemia, unspecified: Secondary | ICD-10-CM | POA: Diagnosis present

## 2024-05-23 DIAGNOSIS — I110 Hypertensive heart disease with heart failure: Secondary | ICD-10-CM | POA: Diagnosis present

## 2024-05-23 DIAGNOSIS — I5032 Chronic diastolic (congestive) heart failure: Secondary | ICD-10-CM | POA: Diagnosis present

## 2024-05-23 MED ORDER — DILTIAZEM HCL ER COATED BEADS 180 MG PO CP24: 180.0000 mg | ORAL_CAPSULE | Freq: Every day | ORAL | 3 refills | Status: AC

## 2024-05-23 NOTE — Progress Notes (Signed)
 " Cardiology Office Note:    Date:  05/23/2024   ID:  Jordan Higgins, [place] August 31, 1945, MRN [id number]  PCP:  Erick Greig LABOR, NP  Cardiologist:  Lamar Fitch, MD    Referring MD: Erick Greig LABOR, NP   No chief complaint on file. I am weak and tired  History of Present Illness:    Jordan Higgins is a 78 y.o. female with essential thrombocytosis, diastolic congestive heart failure, hypertension, history of TIA, comes to my office for follow-up cardiac wise doing well denies of any chest pain tightness squeezing pressure chest no palpitations, biggest complaint is weakness fatigue tiredness she thinks is related thrombocytosis that she has.  She is on medications also blood transfusion was given a while ago.  Past Medical History:  Diagnosis Date   Anemia 12/18/2023   Anxiety    Bilateral ankle joint pain 06/22/2023   Chronic diastolic heart failure (HCC) 01/18/2016   Chronic lower back pain    Degeneration of lumbar intervertebral disc 01/04/2020   Dyslipidemia 01/16/2012   Early satiety 01/25/2021   Encephalopathy 01/17/2012   Essential thrombocythemia (HCC)    GERD (gastroesophageal reflux disease)    Headache 01/16/2012   all day; think its related to TIA     High cholesterol    History of back surgery 01/16/2012   History of falling 06/02/2022   HTN (hypertension) 01/16/2012   Hypertensive heart disease with heart failure (HCC) 06/02/2022   Hyponatremia 01/18/2016   Hypothyroidism    Insomnia, unspecified 06/02/2022   Iron deficiency anemia 03/03/2021   Irritable bowel syndrome without diarrhea 06/02/2022   Major depressive disorder, single episode, 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/02/2022   Osteoarthritis of left foot 06/22/2023   Other spondylosis, lumbar region 06/02/2022   Pain in joint of right knee 11/28/2021   Pain in joints of both feet 06/22/2023   PONV (postoperative nausea and vomiting)    violently; only thing that works for her is scopolamine  patch     Sacroiliac joint pain 06/15/2019   Stroke (HCC) 12/20/2020   TIA (transient ischemic attack) 01/16/2012    Past Surgical History:  Procedure Laterality Date   BACK SURGERY     BLADDER SUSPENSION  1990's   w/rectal suspensiion   BUBBLE STUDY  01/07/2021   Procedure: BUBBLE STUDY;  Surgeon: Jordan Higgins LABOR, MD;  Location[facility] ENDOSCOPY;  Service: Cardiology;;   CHOLECYSTECTOMY  ~ 2010   INCISION AND DRAINAGE OF WOUND  11/15/2011; early 12/2011   abscess; S/P back OR; staph infection in wound   LUMBAR DISC SURGERY  11/06/2011   shaved down 2 bulging discs   PERIPHERALLY INSERTED CENTRAL CATHETER INSERTION  ~ 12/07/2011   right upper arm   TEE WITHOUT CARDIOVERSION N/A 01/07/2021   Procedure: TRANSESOPHAGEAL ECHOCARDIOGRAM (TEE);  Surgeon: Jordan Higgins LABOR, MD;  Location: [facility] ENDOSCOPY;  Service: Cardiology;  Laterality: N/A;    Current Medications: Active Medications[1]   Allergies:   Atorvastatin, Mirtazapine, and Trazodone   Social History   Socioeconomic History   Marital status: Married    Spouse name: Not on file   Number of children: Not on file   Years of education: Not on file   Highest education level: Not on file  Occupational History   Not on file  Tobacco Use   Smoking status: Never   Smokeless tobacco: Never  Substance and Sexual Activity   Alcohol use: No    Alcohol/week: 0.0 standard drinks of alcohol   Drug use:  No   Sexual activity: Not on file  Other Topics Concern   Not on file  Social History Narrative   Not on file   Social Drivers of Health   Tobacco Use: Low Risk (05/23/2024)   Patient History    Smoking Tobacco Use: Never    Smokeless Tobacco Use: Never    Passive Exposure: Not on file  Financial Resource Strain: Not on file  Food Insecurity: Not on file  Transportation Needs: Not on file  Physical Activity: Not on file  Stress: Not on file  Social Connections: Not on file  Depression (PHQ2-9): Low Risk (03/25/2024)    Depression (PHQ2-9)    PHQ-2 Score: 0  Alcohol Screen: Not on file  Housing: Not on file  Utilities: Not on file  Health Literacy: Not on file     Family History: The patient's family history includes Heart attack in her brother. ROS:   Please see the history of present illness.    All 14 point review of systems negative except as described per history of present illness  EKGs/Labs/Other Studies Reviewed:         Recent Labs: 02/04/2024: ALT 9; BUN 16; Creatinine, Ser 0.81; NT-Pro BNP 356; Potassium 4.4; Sodium 137; TSH 2.410 05/05/2024: Hemoglobin 9.1; Platelet Count 691  Recent Lipid Panel    Component Value Date/Time   CHOL 138 01/17/2012 0403   TRIG 135 01/17/2012 0403   HDL 44 01/17/2012 0403   CHOLHDL 3.1 01/17/2012 0403   VLDL 27 01/17/2012 0403   LDLCALC 67 01/17/2012 0403    Physical Exam:    VS:  BP 138/68   Pulse 78   Ht 5' (1.524 m)   Wt 115 lb (52.2 kg)   SpO2 98%   BMI 22.46 kg/m     Wt Readings from Last 3 Encounters:  05/23/24 115 lb (52.2 kg)  03/25/24 117 lb 8 oz (53.3 kg)  02/12/24 114 lb (51.7 kg)     GEN:  Well nourished, well developed in no acute distress HEENT: Normal NECK: No JVD; No carotid bruits LYMPHATICS: No lymphadenopathy CARDIAC: RRR, no murmurs, no rubs, no gallops RESPIRATORY:  Clear to auscultation without rales, wheezing or rhonchi  ABDOMEN: Soft, non-tender, non-distended MUSCULOSKELETAL:  No edema; No deformity  SKIN: Warm and dry LOWER EXTREMITIES: no swelling NEUROLOGIC:  Alert and oriented x 3 PSYCHIATRIC:  Normal affect   ASSESSMENT:    1. Hypertensive heart disease with heart failure (HCC)   2. Chronic diastolic heart failure (HCC)   3. Dyslipidemia    PLAN:    In order of problems listed above:  Hypertensive heart disease, blood pressure well-controlled, she looks compensated on physical exam, minimal swelling of lower extremities she does who is very soft elastic stockings, I will ask her to have proBNP  done to check on her hydration status. Dyslipidemia, I did review [facility] which show me her LDL of 1 of 2 HDL 65 but this is from 2023 will try to get more up-to-date information. Thrombocytosis, follow-up excellently like always by our oncology team.  Sadly she is doing very poorly probably because of that condition.   Medication Adjustments/Labs and Tests Ordered: Current medicines are reviewed at length with the patient today.  Concerns regarding medicines are outlined above.  No orders of the defined types were placed in this encounter.  Medication changes: No orders of the defined types were placed in this encounter.   Signed, Lamar DOROTHA Fitch, MD, [facility] 05/23/2024 11:07 AM  [facility]    [1]  Current Meds  Medication Sig   acetaminophen  (TYLENOL ) 500 MG tablet Take 500 mg by mouth every 6 (six) hours as needed.   aspirin  EC 81 MG tablet Take 81 mg by mouth in the morning. Swallow whole.   busPIRone (BUSPAR) 5 MG tablet Take 5 mg by mouth 3 (three) times daily as needed (nerves).   diltiazem  (CARDIZEM  CD) 180 MG 24 hr capsule Take 1 capsule (180 mg total) by mouth daily.   hydroxyurea  (HYDREA ) 500 MG capsule Take 1 capsule (500 mg total) by mouth as directed. May take with food to minimize GI side effects Hydrea  500mg  po TID Mon Wed Fri 500mg  po BID Tues Thurs Sat and Sun   levothyroxine  (SYNTHROID , LEVOTHROID) 88 MCG tablet Take 88 mcg by mouth daily before breakfast. Take 1 tablet (88 mcg) by mouth in the morning every day EXCEPT on Sunday.   Polyethyl Glycol-Propyl Glycol (SYSTANE) 0.4-0.3 % SOLN Place 1-2 drops into both eyes 3 (three) times daily as needed (dry/irritated eyes.).   sacubitril -valsartan  (ENTRESTO ) 49-51 MG Take 1 tablet by mouth 2 (two) times daily.   traMADol -acetaminophen  (ULTRACET) 37.5-325 MG tablet Take 1 tablet by mouth 2 (two) times daily as needed for moderate pain or severe pain (chronic pain.).   zolpidem (AMBIEN) 10 MG tablet  Take 10 mg by mouth at bedtime as needed for sleep.   [DISCONTINUED] furosemide  (LASIX ) 20 MG tablet Take 1 tablet (20 mg total) by mouth daily as needed for edema.   [DISCONTINUED] methocarbamol (ROBAXIN) 500 MG tablet Take 500 mg by mouth every 4 (four) hours as needed.   "

## 2024-05-23 NOTE — Patient Instructions (Addendum)
 Medication Instructions:  Your physician recommends that you continue on your current medications as directed. Please refer to the Current Medication list given to you today.  *If you need a refill on your cardiac medications before your next appointment, please call your pharmacy*   Lab Work: Your physician recommends that you have a proBNP at your next draw with the cancer center.  If you have labs (blood work) drawn today and your tests are completely normal, you will receive your results only by: MyChart Message (if you have MyChart) OR A paper copy in the mail If you have any lab test that is abnormal or we need to change your treatment, we will call you to review the results.   Testing/Procedures: None ordered   Follow-Up: At [facility], you and your health needs are our priority.  As part of our continuing mission to provide you with exceptional heart care, we have created designated Provider Care Teams.  These Care Teams include your primary Cardiologist (physician) and Advanced Practice Providers (APPs -  Physician Assistants and Nurse Practitioners) who all work together to provide you with the care you need, when you need it.  We recommend signing up for the patient portal called MyChart.  Sign up information is provided on this After Visit Summary.  MyChart is used to connect with patients for Virtual Visits (Telemedicine).  Patients are able to view lab/test results, encounter notes, upcoming appointments, etc.  Non-urgent messages can be sent to your provider as well.   To learn more about what you can do with MyChart, go to [email or web address].    Your next appointment:   6 month(s)  The format for your next appointment:   In Person  Provider:   Lamar Fitch, MD    Other Instructions none  Important Information About Sugar

## 2024-06-03 ENCOUNTER — Telehealth: Payer: Self-pay | Admitting: Hematology and Oncology

## 2024-06-03 ENCOUNTER — Other Ambulatory Visit: Payer: Self-pay | Admitting: Hematology and Oncology

## 2024-06-03 ENCOUNTER — Other Ambulatory Visit: Payer: Self-pay

## 2024-06-03 ENCOUNTER — Other Ambulatory Visit: Payer: Self-pay | Admitting: Cardiology

## 2024-06-03 ENCOUNTER — Other Ambulatory Visit (HOSPITAL_BASED_OUTPATIENT_CLINIC_OR_DEPARTMENT_OTHER): Payer: Self-pay

## 2024-06-03 ENCOUNTER — Inpatient Hospital Stay

## 2024-06-03 ENCOUNTER — Inpatient Hospital Stay: Attending: Oncology

## 2024-06-03 ENCOUNTER — Inpatient Hospital Stay: Admitting: Hematology and Oncology

## 2024-06-03 VITALS — BP 153/79 | HR 75 | Temp 98.5°F | Resp 20 | Ht 60.0 in | Wt 117.1 lb

## 2024-06-03 DIAGNOSIS — D75839 Thrombocytosis, unspecified: Secondary | ICD-10-CM | POA: Insufficient documentation

## 2024-06-03 DIAGNOSIS — D473 Essential (hemorrhagic) thrombocythemia: Secondary | ICD-10-CM | POA: Insufficient documentation

## 2024-06-03 DIAGNOSIS — D649 Anemia, unspecified: Secondary | ICD-10-CM

## 2024-06-03 LAB — CBC WITH DIFFERENTIAL (CANCER CENTER ONLY)
Abs Immature Granulocytes: 0.03 K/uL (ref 0.00–0.07)
Basophils Absolute: 0 K/uL (ref 0.0–0.1)
Basophils Relative: 1 %
Eosinophils Absolute: 0 K/uL (ref 0.0–0.5)
Eosinophils Relative: 0 %
HCT: 25.4 % — ABNORMAL LOW (ref 36.0–46.0)
Hemoglobin: 8.4 g/dL — ABNORMAL LOW (ref 12.0–15.0)
Immature Granulocytes: 1 %
Lymphocytes Relative: 7 %
Lymphs Abs: 0.4 K/uL — ABNORMAL LOW (ref 0.7–4.0)
MCH: 47.7 pg — ABNORMAL HIGH (ref 26.0–34.0)
MCHC: 33.1 g/dL (ref 30.0–36.0)
MCV: 144.3 fL — ABNORMAL HIGH (ref 80.0–100.0)
Monocytes Absolute: 0.4 K/uL (ref 0.1–1.0)
Monocytes Relative: 6 %
Neutro Abs: 5 K/uL (ref 1.7–7.7)
Neutrophils Relative %: 85 %
Platelet Count: 741 K/uL — ABNORMAL HIGH (ref 150–400)
RBC: 1.76 MIL/uL — ABNORMAL LOW (ref 3.87–5.11)
RDW: 17.2 % — ABNORMAL HIGH (ref 11.5–15.5)
WBC Count: 5.8 K/uL (ref 4.0–10.5)
nRBC: 0 % (ref 0.0–0.2)

## 2024-06-03 MED ORDER — TRAMADOL HCL 50 MG PO TABS
50.0000 mg | ORAL_TABLET | Freq: Four times a day (QID) | ORAL | 0 refills | Status: DC | PRN
  Filled 2024-06-03: qty 30, 4d supply, fill #0

## 2024-06-03 MED ORDER — EPOETIN ALFA-EPBX 20000 UNIT/ML IJ SOLN
20000.0000 [IU] | Freq: Once | INTRAMUSCULAR | Status: AC
  Administered 2024-06-03: 20000 [IU] via SUBCUTANEOUS
  Filled 2024-06-03: qty 1

## 2024-06-03 NOTE — Telephone Encounter (Signed)
 Patient has been scheduled for follow-up visit per 06/03/2024 LOS.  Pt given an appt calendar with date and time.

## 2024-06-03 NOTE — Progress Notes (Signed)
 " [facility] [address] [place],  [place]  [address] [phone]  Clinic Day:   06/03/2024  Referring physician: Erick Greig LABOR, NP  HISTORY OF PRESENT ILLNESS:  The patient is a 79 y.o. female with essential thrombocythemia.  She comes in today to reassess her platelets.  Of note, she remains on hydroxyurea  500 mg twice daily.  This patient also receives monthly Retacrit  injections as her hemoglobin has been persistently below 10.  Since her last visit, the patient has been doing somewhat better.  Despite her elevated platelets, she denies having any clotting complications.  PHYSICAL EXAM:  Blood pressure (!) 153/79, pulse 75, temperature 98.5 F (36.9 C), temperature source Oral, resp. rate 20, height 5' (1.524 m), weight 117 lb 1.6 oz (53.1 kg), SpO2 100%. Wt Readings from Last 3 Encounters:  06/03/24 117 lb 1.6 oz (53.1 kg)  05/23/24 115 lb (52.2 kg)  03/25/24 117 lb 8 oz (53.3 kg)   Body mass index is 22.87 kg/m. Performance status (ECOG): 2 Physical Exam Constitutional:      Appearance: Normal appearance. She is not ill-appearing.     Comments: She is ambulating with a walker  HENT:     Mouth/Throat:     Mouth: Mucous membranes are moist.     Pharynx: Oropharynx is clear. No oropharyngeal exudate or posterior oropharyngeal erythema.  Cardiovascular:     Rate and Rhythm: Normal rate and regular rhythm.     Heart sounds: No murmur heard.    No friction rub. No gallop.  Pulmonary:     Effort: Pulmonary effort is normal. No respiratory distress.     Breath sounds: Normal breath sounds. No wheezing, rhonchi or rales.  Abdominal:     General: Bowel sounds are normal. There is no distension.     Palpations: Abdomen is soft. There is no mass.     Tenderness: There is no abdominal tenderness.  Musculoskeletal:        General: No swelling.     Right lower leg: 1+ Edema present.     Left lower leg: 1+ Edema present.  Lymphadenopathy:      Cervical: No cervical adenopathy.     Upper Body:     Right upper body: No supraclavicular or axillary adenopathy.     Left upper body: No supraclavicular or axillary adenopathy.     Lower Body: No right inguinal adenopathy. No left inguinal adenopathy.  Skin:    General: Skin is warm.     Coloration: Skin is not jaundiced.     Findings: No lesion or rash.  Neurological:     General: No focal deficit present.     Mental Status: She is alert and oriented to person, place, and time. Mental status is at baseline.  Psychiatric:        Mood and Affect: Mood normal.        Behavior: Behavior normal.        Thought Content: Thought content normal.    LABS:      Latest Ref Rng & Units 06/03/2024   10:20 AM 05/05/2024   11:04 AM 04/08/2024   10:26 AM  CBC  WBC 4.0 - 10.5 K/uL 5.8  5.3  5.8   Hemoglobin 12.0 - 15.0 g/dL 8.4  9.1  8.8   Hematocrit 36.0 - 46.0 % 25.4  26.9  26.1   Platelets 150 - 400 K/uL 741  691  623     Latest Reference Range &  Units 03/25/24 09:52  Iron 28 - 170 ug/dL 96  UIBC ug/dL 858  TIBC 749 - 549 ug/dL 762 (L)  Saturation Ratios 10.4 - 31.8 % 40 (H)  Ferritin 11 - 307 ng/mL 182  (L): Data is abnormally low (H): Data is abnormally high  PATHOLOGY:  Her bone marrow biopsy revealed the following: BONE MARROW, ASPIRATE, CLOT, CORE:  - Cellular bone marrow with megakaryocytic hyperplasia and otherwise  orderly myeloid and erythroid maturation   PERIPHERAL BLOOD:  - Thrombocytosis   BONE MARROW ASPIRATE: The bone marrow aspirate smear slides contain  several cellular bone marrow spicules which are adequate for  interpretation.  Erythroid precursors:  The erythroid series is present in adequate  number with orderly maturation and unremarkable morphology.  Granulocytic precursors: The myeloid series is present in adequate  number with orderly maturation and unremarkable morphology.  Megakaryocytes: The megakaryocytes are present in increased number some   with hyper lobulation as well as a subset with hyper lobulation.  Lymphocytes/plasma cells: Lymphocytes and plasma cells are not  increased.   CLOT AND BIOPSY: The biopsy consists of a very scant core of periosteum and cortical bone with significant aspiration artifact and is overall suboptimal to inadequate for evaluation.  There is very limited hematopoietic marrow for evaluation.  It is noted that there are increased megakaryocytes with clustering.  The clot section has a limited number of hematopoietic spicules ranging in cellularity from 5% to focally 50% with an overall cellularity interpreted as 20% on limited material and possible subcortical location given the biopsy.  There is significant megakaryocytic clustering present with abnormally lobulated megakaryocytes.  There is otherwise orderly myeloid and erythroid maturation.   IMMUNOHISTOCHEMICAL STAINS: Immunohistochemical stains were performed in both biopsy and clot section.  There is no increase in CD34/CD117 blast/immature precursors.  E-cadherin and myeloperoxidase highlight the erythroid and myeloid compartment respectively.  SPECIAL STAINS: Trichrome and reticulin stains were not performed to evaluate for the presence of fibrosis due to the limited material on the biopsy.   IRON STAIN: Iron stains are performed on a bone marrow aspirate or touch  imprint smear and section of clot. The controls stained appropriately.        Storage Iron: Adequate histiocytic iron stores       Ring Sideroblasts: Not identified     ASSESSMENT & PLAN:  Assessment/Plan:  An 79 y.o. female with essential thrombocythemia.  When evaluating her labs today, there has been a slight increase in platelets to 741 from 691.  Ideally, the goal is to get her platelets less than 400-600.  Although she is not there currently, she will remain on hydroxyurea  500 mg twice daily with the hopes that her platelets will continue to fall to desired levels.   Her hemoglobin is 8.4 today.  She will receive her Retacrit  injection today.   Her CBC will continue to be followed on a monthly basis.  I will see her back in 3 months for repeat clinical assessment.  The patient understands all the plans discussed today and is in agreement with them.    Eleanor Bach, FNP-BC            "

## 2024-06-03 NOTE — Patient Instructions (Signed)

## 2024-06-04 LAB — PRO B NATRIURETIC PEPTIDE: NT-Pro BNP: 504 pg/mL (ref 0–738)

## 2024-06-06 ENCOUNTER — Ambulatory Visit: Payer: Self-pay | Admitting: Cardiology

## 2024-06-23 ENCOUNTER — Other Ambulatory Visit (HOSPITAL_BASED_OUTPATIENT_CLINIC_OR_DEPARTMENT_OTHER): Payer: Self-pay

## 2024-06-23 MED ORDER — SACUBITRIL-VALSARTAN 49-51 MG PO TABS
1.0000 | ORAL_TABLET | Freq: Two times a day (BID) | ORAL | 4 refills | Status: AC
  Filled 2024-06-23: qty 60, 30d supply, fill #0
  Filled 2024-07-01: qty 180, 90d supply, fill #0

## 2024-06-30 NOTE — Progress Notes (Signed)
 " [facility] [address] [place],  [place]  [address] [phone]  Clinic Day:   07/01/2024  Referring physician: Erick Greig LABOR, NP  HISTORY OF PRESENT ILLNESS:  The patient is a 79 y.o. female with essential thrombocythemia.  She comes in today to reassess her platelets.  Of note, she remains on hydroxyurea  500 mg twice daily.  This patient also receives monthly Retacrit  injections as her hemoglobin has been persistently below 10.  Since her last visit, the patient has been doing somewhat better.  Despite her elevated platelets, she denies having any clotting complications.  PHYSICAL EXAM:  Blood pressure (!) 158/79, pulse 99, temperature 97.6 F (36.4 C), temperature source Oral, resp. rate 14, height 5' (1.524 m), weight 116 lb 1.6 oz (52.7 kg), SpO2 100%. Wt Readings from Last 3 Encounters:  07/01/24 116 lb 1.6 oz (52.7 kg)  06/03/24 117 lb 1.6 oz (53.1 kg)  05/23/24 115 lb (52.2 kg)   Body mass index is 22.67 kg/m. Performance status (ECOG): 2 Physical Exam Constitutional:      Appearance: Normal appearance. She is not ill-appearing.     Comments: She is ambulating with a walker  HENT:     Mouth/Throat:     Mouth: Mucous membranes are moist.     Pharynx: Oropharynx is clear. No oropharyngeal exudate or posterior oropharyngeal erythema.  Cardiovascular:     Rate and Rhythm: Normal rate and regular rhythm.     Heart sounds: No murmur heard.    No friction rub. No gallop.  Pulmonary:     Effort: Pulmonary effort is normal. No respiratory distress.     Breath sounds: Normal breath sounds. No wheezing, rhonchi or rales.  Abdominal:     General: Bowel sounds are normal. There is no distension.     Palpations: Abdomen is soft. There is no mass.     Tenderness: There is no abdominal tenderness.  Musculoskeletal:        General: No swelling.     Right lower leg: 1+ Edema present.     Left lower leg: 1+ Edema present.  Lymphadenopathy:      Cervical: No cervical adenopathy.     Upper Body:     Right upper body: No supraclavicular or axillary adenopathy.     Left upper body: No supraclavicular or axillary adenopathy.     Lower Body: No right inguinal adenopathy. No left inguinal adenopathy.  Skin:    General: Skin is warm.     Coloration: Skin is not jaundiced.     Findings: No lesion or rash.  Neurological:     General: No focal deficit present.     Mental Status: She is alert and oriented to person, place, and time. Mental status is at baseline.  Psychiatric:        Mood and Affect: Mood normal.        Behavior: Behavior normal.        Thought Content: Thought content normal.    LABS:      Latest Ref Rng & Units 07/01/2024   10:36 AM 06/03/2024   10:20 AM 05/05/2024   11:04 AM  CBC  WBC 4.0 - 10.5 K/uL 9.1  5.8  5.3   Hemoglobin 12.0 - 15.0 g/dL 8.5  8.4  9.1   Hematocrit 36.0 - 46.0 % 25.0  25.4  26.9   Platelets 150 - 400 K/uL 1,410  741  691     Latest Reference Range &  Units 07/01/24 10:36  Iron 28 - 170 ug/dL 899  UIBC ug/dL 824  TIBC 749 - 549 ug/dL 725  Saturation Ratios 10.4 - 31.8 % 36 (H)  Ferritin 11 - 307 ng/mL 136  Folate >5.9 ng/mL 7.2  Transferrin Receptor 12.2 - 27.3 nmol/L 29.4 (H)  Vitamin B12 180 - 914 pg/mL 395  (H): Data is abnormally high  PATHOLOGY:  Her bone marrow biopsy revealed the following: BONE MARROW, ASPIRATE, CLOT, CORE:  - Cellular bone marrow with megakaryocytic hyperplasia and otherwise  orderly myeloid and erythroid maturation   PERIPHERAL BLOOD:  - Thrombocytosis   BONE MARROW ASPIRATE: The bone marrow aspirate smear slides contain  several cellular bone marrow spicules which are adequate for  interpretation.  Erythroid precursors:  The erythroid series is present in adequate  number with orderly maturation and unremarkable morphology.  Granulocytic precursors: The myeloid series is present in adequate  number with orderly maturation and unremarkable morphology.   Megakaryocytes: The megakaryocytes are present in increased number some  with hyper lobulation as well as a subset with hyper lobulation.  Lymphocytes/plasma cells: Lymphocytes and plasma cells are not  increased.   CLOT AND BIOPSY: The biopsy consists of a very scant core of periosteum and cortical bone with significant aspiration artifact and is overall suboptimal to inadequate for evaluation.  There is very limited hematopoietic marrow for evaluation.  It is noted that there are increased megakaryocytes with clustering.  The clot section has a limited number of hematopoietic spicules ranging in cellularity from 5% to focally 50% with an overall cellularity interpreted as 20% on limited material and possible subcortical location given the biopsy.  There is significant megakaryocytic clustering present with abnormally lobulated megakaryocytes.  There is otherwise orderly myeloid and erythroid maturation.   IMMUNOHISTOCHEMICAL STAINS: Immunohistochemical stains were performed in both biopsy and clot section.  There is no increase in CD34/CD117 blast/immature precursors.  E-cadherin and myeloperoxidase highlight the erythroid and myeloid compartment respectively.  SPECIAL STAINS: Trichrome and reticulin stains were not performed to evaluate for the presence of fibrosis due to the limited material on the biopsy.   IRON STAIN: Iron stains are performed on a bone marrow aspirate or touch  imprint smear and section of clot. The controls stained appropriately.        Storage Iron: Adequate histiocytic iron stores       Ring Sideroblasts: Not identified     ASSESSMENT & PLAN:  Assessment/Plan:  An 79 y.o. female with essential thrombocythemia.  When evaluating her labs today, there has been a significant rise in her platelets.  When evaluating her iron parameters, it does not appear that she is iron deficient.  However, as her soluble transferrin receptor is elevated, I will give her  IV iron just to see if further improvement her iron stores will lead to a modest improvement in her platelets.  The thinking is a component of her elevated platelets may be a reactive process to having reduced iron stores.  For now, she will continue to take hydroxyurea  500 mg twice daily.  I will see her back in 1 month for repeat clinical assessment.  If her platelets remain extremely elevated, the patient understands a repeat bone marrow biopsy will need to be considered to ensure a worsening hematologic process is not developing.  The patient understands all the plans discussed today and is in agreement with them.    Jordan Higgins Jordan Kerns, MD            "

## 2024-07-01 ENCOUNTER — Inpatient Hospital Stay

## 2024-07-01 ENCOUNTER — Inpatient Hospital Stay: Admitting: Oncology

## 2024-07-01 ENCOUNTER — Telehealth: Payer: Self-pay

## 2024-07-01 ENCOUNTER — Other Ambulatory Visit (HOSPITAL_BASED_OUTPATIENT_CLINIC_OR_DEPARTMENT_OTHER): Payer: Self-pay

## 2024-07-01 ENCOUNTER — Other Ambulatory Visit: Payer: Self-pay

## 2024-07-01 ENCOUNTER — Other Ambulatory Visit: Payer: Self-pay | Admitting: Oncology

## 2024-07-01 VITALS — BP 158/79 | HR 99 | Temp 97.6°F | Resp 14 | Ht 60.0 in | Wt 116.1 lb

## 2024-07-01 DIAGNOSIS — D649 Anemia, unspecified: Secondary | ICD-10-CM

## 2024-07-01 DIAGNOSIS — D473 Essential (hemorrhagic) thrombocythemia: Secondary | ICD-10-CM | POA: Diagnosis not present

## 2024-07-01 LAB — CBC WITH DIFFERENTIAL (CANCER CENTER ONLY)
Abs Immature Granulocytes: 0.08 10*3/uL — ABNORMAL HIGH (ref 0.00–0.07)
Basophils Absolute: 0 10*3/uL (ref 0.0–0.1)
Basophils Relative: 0 %
Eosinophils Absolute: 0 10*3/uL (ref 0.0–0.5)
Eosinophils Relative: 0 %
HCT: 25 % — ABNORMAL LOW (ref 36.0–46.0)
Hemoglobin: 8.5 g/dL — ABNORMAL LOW (ref 12.0–15.0)
Immature Granulocytes: 1 %
Lymphocytes Relative: 5 %
Lymphs Abs: 0.5 10*3/uL — ABNORMAL LOW (ref 0.7–4.0)
MCH: 50.3 pg — ABNORMAL HIGH (ref 26.0–34.0)
MCHC: 34 g/dL (ref 30.0–36.0)
MCV: 147.9 fL — ABNORMAL HIGH (ref 80.0–100.0)
Monocytes Absolute: 0.7 10*3/uL (ref 0.1–1.0)
Monocytes Relative: 7 %
Neutro Abs: 7.8 10*3/uL — ABNORMAL HIGH (ref 1.7–7.7)
Neutrophils Relative %: 87 %
Platelet Count: 1410 10*3/uL (ref 150–400)
RBC: 1.69 MIL/uL — ABNORMAL LOW (ref 3.87–5.11)
RDW: 16 % — ABNORMAL HIGH (ref 11.5–15.5)
WBC Count: 9.1 10*3/uL (ref 4.0–10.5)
nRBC: 0 % (ref 0.0–0.2)

## 2024-07-01 LAB — FERRITIN: Ferritin: 136 ng/mL (ref 11–307)

## 2024-07-01 LAB — IRON AND TIBC
Iron: 100 ug/dL (ref 28–170)
Saturation Ratios: 36 % — ABNORMAL HIGH (ref 10.4–31.8)
TIBC: 274 ug/dL (ref 250–450)
UIBC: 175 ug/dL

## 2024-07-01 LAB — VITAMIN B12: Vitamin B-12: 395 pg/mL (ref 180–914)

## 2024-07-01 LAB — FOLATE: Folate: 7.2 ng/mL

## 2024-07-01 MED ORDER — TRAMADOL HCL 50 MG PO TABS
50.0000 mg | ORAL_TABLET | Freq: Four times a day (QID) | ORAL | 0 refills | Status: AC | PRN
  Filled 2024-07-01: qty 30, 4d supply, fill #0

## 2024-07-01 MED ORDER — EPOETIN ALFA-EPBX 20000 UNIT/ML IJ SOLN
20000.0000 [IU] | Freq: Once | INTRAMUSCULAR | Status: AC
  Administered 2024-07-01: 20000 [IU] via SUBCUTANEOUS
  Filled 2024-07-01: qty 1

## 2024-07-01 MED ORDER — HYDROXYUREA 500 MG PO CAPS
500.0000 mg | ORAL_CAPSULE | Freq: Two times a day (BID) | ORAL | 3 refills | Status: AC
  Filled 2024-07-01: qty 90, 45d supply, fill #0

## 2024-07-01 NOTE — Patient Instructions (Signed)

## 2024-07-01 NOTE — Progress Notes (Signed)
 CRITICAL VALUE STICKER  CRITICAL VALUE:  Plt 1410  RECEIVER (on-site recipient of call):  Woodie Kapur RN  DATE & TIME NOTIFIED:   07/01/2024 @ 1052  MESSENGER (representative from lab):  [facility]  MD NOTIFIED:   Dr. Ezzard   TIME OF NOTIFICATION:  1109  RESPONSE:  Scheduled to see patient in clinic.

## 2024-07-01 NOTE — Telephone Encounter (Signed)
 She's not iron deficient. Tell her to take hydroxyurea  500 mg bid as always. have schedulers see me back in 1 month, instead of 2, just to make sure platelets aren't getting even higher   Latest Reference Range & Units 07/01/24 10:36  Iron 28 - 170 ug/dL 899  UIBC ug/dL 824  TIBC 749 - 549 ug/dL 725  Saturation Ratios 10.4 - 31.8 % 36 (H)  Ferritin 11 - 307 ng/mL 136  Folate >5.9 ng/mL 7.2  Vitamin B12 180 - 914 pg/mL 395  (H): Data is abnormally high

## 2024-07-03 LAB — SOLUBLE TRANSFERRIN RECEPTOR: Transferrin Receptor: 29.4 nmol/L — ABNORMAL HIGH (ref 12.2–27.3)

## 2024-07-04 ENCOUNTER — Other Ambulatory Visit: Payer: Self-pay | Admitting: Pharmacist

## 2024-07-08 ENCOUNTER — Other Ambulatory Visit: Payer: Self-pay | Admitting: Oncology

## 2024-07-08 ENCOUNTER — Inpatient Hospital Stay: Attending: Oncology

## 2024-07-08 VITALS — BP 190/91 | HR 93 | Temp 97.7°F | Resp 16

## 2024-07-08 DIAGNOSIS — D649 Anemia, unspecified: Secondary | ICD-10-CM

## 2024-07-08 DIAGNOSIS — R110 Nausea: Secondary | ICD-10-CM

## 2024-07-08 MED ORDER — HYDROMORPHONE HCL 1 MG/ML IJ SOLN
1.0000 mg | Freq: Once | INTRAMUSCULAR | Status: AC
  Administered 2024-07-08: 1 mg via INTRAVENOUS
  Filled 2024-07-08: qty 1

## 2024-07-08 MED ORDER — SODIUM CHLORIDE 0.9 % IV SOLN: INTRAVENOUS | Status: DC

## 2024-07-08 MED ORDER — SODIUM CHLORIDE 0.9 % IV SOLN
1000.0000 mg | Freq: Once | INTRAVENOUS | Status: AC
  Administered 2024-07-08: 1000 mg via INTRAVENOUS
  Filled 2024-07-08: qty 1000

## 2024-07-08 MED ORDER — LORATADINE 10 MG PO TABS
10.0000 mg | ORAL_TABLET | Freq: Once | ORAL | Status: AC
  Administered 2024-07-08: 10 mg via ORAL
  Filled 2024-07-08: qty 1

## 2024-07-08 MED ORDER — ONDANSETRON HCL 40 MG/20ML IJ SOLN
8.0000 mg | Freq: Once | INTRAMUSCULAR | Status: AC
  Administered 2024-07-08: 8 mg via INTRAVENOUS
  Filled 2024-07-08: qty 4

## 2024-07-08 MED ORDER — ACETAMINOPHEN 325 MG PO TABS
650.0000 mg | ORAL_TABLET | Freq: Once | ORAL | Status: AC
  Administered 2024-07-08: 650 mg via ORAL
  Filled 2024-07-08: qty 2

## 2024-07-08 NOTE — Patient Instructions (Signed)
 Ferric Derisomaltose  Injection What is this medication? FERRIC DERISOMALTOSE  (FER ik der EYE soe MAWL tose) treats low levels of iron in your body (iron deficiency anemia). Iron is a mineral that plays an important role in making red blood cells, which carry oxygen from your lungs to the rest of your body. This medicine may be used for other purposes; ask your health care provider or pharmacist if you have questions. COMMON BRAND NAME(S): MONOFERRIC  What should I tell my care team before I take this medication? They need to know if you have any of these conditions: High levels of iron in the blood An unusual or allergic reaction to iron, other medications, foods, dyes, or preservatives Pregnant or trying to get pregnant Breastfeeding How should I use this medication? This medication is injected into a vein. It is given by your care team in a hospital or clinic setting. Talk to your care team about the use of this medication in children. Special care may be needed. Overdosage: If you think you have taken too much of this medicine contact a poison control center or emergency room at once. NOTE: This medicine is only for you. Do not share this medicine with others. What if I miss a dose? It is important not to miss your dose. Call your care team if you are unable to keep an appointment. What may interact with this medication? Do not take this medication with any of the following: Deferoxamine Dimercaprol Other iron products This list may not describe all possible interactions. Give your health care provider a list of all the medicines, herbs, non-prescription drugs, or dietary supplements you use. Also tell them if you smoke, drink alcohol, or use illegal drugs. Some items may interact with your medicine. What should I watch for while using this medication? Your condition will be monitored carefully while you are receiving this medication. Tell your care team if your symptoms do not start to  get better or if they get worse. You may need blood work done while you are taking this medication. Sometimes, when medications are infused into veins, a little can leak out of the vein and into the tissue around it. If this medication leaks, it can cause a brown or dark stain on the skin. This is not common. It may be permanent. If you feel pain or swelling during your infusion, tell your care team right away. They can stop the infusion and treat the area. You may need to eat more foods that contain iron. Talk to your care team. Foods that contain iron include whole grains or cereals, dried fruits, beans, peas, leafy green vegetables, and organ meats (liver, kidney). What side effects may I notice from receiving this medication? Side effects that you should report to your care team as soon as possible: Allergic reactions--skin rash, itching, hives, swelling of the face, lips, tongue, or throat Low blood pressure--dizziness, feeling faint or lightheaded, blurry vision Painful swelling, warmth, or redness of the skin, brown or dark skin color at the infusion site Shortness of breath Side effects that usually do not require medical attention (report these to your care team if they continue or are bothersome): Flushing Headache Joint pain Muscle pain Nausea This list may not describe all possible side effects. Call your doctor for medical advice about side effects. You may report side effects to FDA at 1-800-FDA-1088. Where should I keep my medication? This medication is given in a hospital or clinic. It will not be stored at home. NOTE: This  sheet is a summary. It may not cover all possible information. If you have questions about this medicine, talk to your doctor, pharmacist, or health care provider.  2025 Elsevier/Gold Standard (2024-04-06 00:00:00)

## 2024-07-08 NOTE — Progress Notes (Signed)
 Reports severe generalized pain that has increased over the last week- states that after 4pm she is unable to swallow or eat. States that she is also unable to see correctly- Offered to have patient transported to ED but patient declines- Daughter at chairside. Requests pain med, nausea med and nerve med- Dr Ezzard and Devere Manzanilla Pharm D aware and order received. Discussed with patient while daughter present that pain control, weakness and difficulty swallowing need to be addressed by her PCP. Patient states She will not know what to do. Patient expresses a desire to continue with Iron administration today. Per Dr. Ezzard it is ok to proceed with monoferric  if patient is in agreement. Patient able to take po premeds without difficulty.   1400- Smiling, states that pain is beginning to reduce.   1430- States that pain is reduced to a 5/10- states that she can see better- States that she thinks her symptoms were About the pain - Asks if Dr. Ezzard will prescribe any pain meds- Patient encouraged to follow up with PCP for pain control.   1540- Patient tolerated monoferric  infusion well- On discharge patient states I am so weak I don't know if I have pain. Patient vomited approximately tan emesis. States that she is shakey. Spoke with daughter about possible ED visit- Daughter states that she believes nausea is from earlier pain med.   [id number] Daughter states that patient is always shakey at home. Devere Manzanilla at chairside to discuss follow up with PCP for pain control or referral to pain clinic. Encouraged follow up with PCP or ED is symptoms persist. Daughter states understanding. Discharged home.

## 2024-07-29 ENCOUNTER — Inpatient Hospital Stay

## 2024-07-29 ENCOUNTER — Inpatient Hospital Stay: Admitting: Oncology

## 2024-08-29 ENCOUNTER — Inpatient Hospital Stay

## 2024-08-29 ENCOUNTER — Inpatient Hospital Stay: Admitting: Oncology
